# Patient Record
Sex: Male | Born: 1950
Health system: Southern US, Community
[De-identification: ages and names within clinical notes are randomized; demographics above are authoritative.]

## PROBLEM LIST (undated history)

## (undated) DIAGNOSIS — I10 Essential (primary) hypertension: Secondary | ICD-10-CM

## (undated) DIAGNOSIS — M1990 Unspecified osteoarthritis, unspecified site: Secondary | ICD-10-CM

## (undated) DIAGNOSIS — F419 Anxiety disorder, unspecified: Secondary | ICD-10-CM

## (undated) DIAGNOSIS — I82409 Acute embolism and thrombosis of unspecified deep veins of unspecified lower extremity: Secondary | ICD-10-CM

## (undated) DIAGNOSIS — Z923 Personal history of irradiation: Secondary | ICD-10-CM

## (undated) DIAGNOSIS — C801 Malignant (primary) neoplasm, unspecified: Secondary | ICD-10-CM

## (undated) DIAGNOSIS — K219 Gastro-esophageal reflux disease without esophagitis: Secondary | ICD-10-CM

## (undated) DIAGNOSIS — F32A Depression, unspecified: Secondary | ICD-10-CM

## (undated) HISTORY — PX: COLONOSCOPY: SHX174

## (undated) HISTORY — PX: TONSILLECTOMY: SUR1361

## (undated) HISTORY — PX: ADENOIDECTOMY: SUR15

---
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
## 2014-02-24 ENCOUNTER — Other Ambulatory Visit (HOSPITAL_COMMUNITY): Payer: Self-pay | Admitting: Urology

## 2014-02-24 DIAGNOSIS — C61 Malignant neoplasm of prostate: Secondary | ICD-10-CM

## 2014-03-09 ENCOUNTER — Encounter (HOSPITAL_COMMUNITY): Payer: Commercial Managed Care - HMO

## 2014-03-09 ENCOUNTER — Encounter (HOSPITAL_COMMUNITY): Payer: Self-pay

## 2014-03-29 ENCOUNTER — Ambulatory Visit (HOSPITAL_COMMUNITY)
Admission: RE | Admit: 2014-03-29 | Discharge: 2014-03-29 | Disposition: A | Discharge status: Home / Self Care | Payer: Medicare PPO | Source: Ambulatory Visit | Attending: Urology | Admitting: Urology

## 2014-03-29 ENCOUNTER — Encounter (HOSPITAL_COMMUNITY): Admission: RE | Admit: 2014-03-29 | Payer: Medicare PPO | Source: Ambulatory Visit

## 2014-03-29 DIAGNOSIS — C61 Malignant neoplasm of prostate: Secondary | ICD-10-CM | POA: Diagnosis present

## 2014-03-29 MED ORDER — TECHNETIUM TC 99M MEDRONATE IV KIT
25.0000 | PACK | Freq: Once | INTRAVENOUS | Status: AC | PRN
  Administered 2014-03-29: 25 via INTRAVENOUS

## 2014-06-08 ENCOUNTER — Encounter (HOSPITAL_COMMUNITY): Payer: Self-pay | Admitting: Emergency Medicine

## 2014-06-08 ENCOUNTER — Emergency Department (HOSPITAL_COMMUNITY): Payer: Medicare PPO

## 2014-06-08 ENCOUNTER — Emergency Department (HOSPITAL_COMMUNITY)
Admission: EM | Admit: 2014-06-08 | Discharge: 2014-06-08 | Disposition: A | Discharge status: Home / Self Care | Payer: Medicare PPO | Attending: Emergency Medicine | Admitting: Emergency Medicine

## 2014-06-08 DIAGNOSIS — I10 Essential (primary) hypertension: Secondary | ICD-10-CM | POA: Insufficient documentation

## 2014-06-08 DIAGNOSIS — I82401 Acute embolism and thrombosis of unspecified deep veins of right lower extremity: Secondary | ICD-10-CM

## 2014-06-08 DIAGNOSIS — M7989 Other specified soft tissue disorders: Secondary | ICD-10-CM

## 2014-06-08 DIAGNOSIS — Z79899 Other long term (current) drug therapy: Secondary | ICD-10-CM | POA: Insufficient documentation

## 2014-06-08 DIAGNOSIS — I82409 Acute embolism and thrombosis of unspecified deep veins of unspecified lower extremity: Secondary | ICD-10-CM | POA: Insufficient documentation

## 2014-06-08 DIAGNOSIS — Z859 Personal history of malignant neoplasm, unspecified: Secondary | ICD-10-CM | POA: Diagnosis not present

## 2014-06-08 DIAGNOSIS — M79609 Pain in unspecified limb: Secondary | ICD-10-CM | POA: Insufficient documentation

## 2014-06-08 HISTORY — DX: Malignant (primary) neoplasm, unspecified: C80.1

## 2014-06-08 HISTORY — DX: Essential (primary) hypertension: I10

## 2014-06-08 LAB — CBC WITH DIFFERENTIAL/PLATELET
BASOS ABS: 0.1 10*3/uL (ref 0.0–0.1)
Basophils Relative: 1 % (ref 0–1)
Eosinophils Absolute: 0.1 10*3/uL (ref 0.0–0.7)
Eosinophils Relative: 1 % (ref 0–5)
HEMATOCRIT: 44.6 % (ref 39.0–52.0)
Hemoglobin: 15.3 g/dL (ref 13.0–17.0)
LYMPHS PCT: 14 % (ref 12–46)
Lymphs Abs: 1.5 10*3/uL (ref 0.7–4.0)
MCH: 31.2 pg (ref 26.0–34.0)
MCHC: 34.3 g/dL (ref 30.0–36.0)
MCV: 90.8 fL (ref 78.0–100.0)
MONO ABS: 1.2 10*3/uL — AB (ref 0.1–1.0)
MONOS PCT: 10 % (ref 3–12)
Neutro Abs: 8.4 10*3/uL — ABNORMAL HIGH (ref 1.7–7.7)
Neutrophils Relative %: 74 % (ref 43–77)
Platelets: 171 10*3/uL (ref 150–400)
RBC: 4.91 MIL/uL (ref 4.22–5.81)
RDW: 13.2 % (ref 11.5–15.5)
WBC: 11.2 10*3/uL — ABNORMAL HIGH (ref 4.0–10.5)

## 2014-06-08 LAB — BASIC METABOLIC PANEL WITH GFR
Anion gap: 11 (ref 5–15)
BUN: 16 mg/dL (ref 6–23)
CO2: 28 meq/L (ref 19–32)
Calcium: 9.5 mg/dL (ref 8.4–10.5)
Chloride: 100 meq/L (ref 96–112)
Creatinine, Ser: 1.25 mg/dL (ref 0.50–1.35)
GFR calc Af Amer: 70 mL/min — ABNORMAL LOW
GFR calc non Af Amer: 61 mL/min — ABNORMAL LOW
Glucose, Bld: 96 mg/dL (ref 70–99)
Potassium: 4.4 meq/L (ref 3.7–5.3)
Sodium: 139 meq/L (ref 137–147)

## 2014-06-08 LAB — PROTIME-INR
INR: 1.07 (ref 0.00–1.49)
Prothrombin Time: 13.9 s (ref 11.6–15.2)

## 2014-06-08 LAB — APTT: aPTT: 30 s (ref 24–37)

## 2014-06-08 MED ORDER — RIVAROXABAN 15 MG PO TABS: 15.0000 mg | ORAL_TABLET | Freq: Two times a day (BID) | ORAL | Status: DC

## 2014-06-08 MED ORDER — RIVAROXABAN 15 MG PO TABS
15.0000 mg | ORAL_TABLET | Freq: Once | ORAL | Status: AC
  Administered 2014-06-08: 15 mg via ORAL
  Filled 2014-06-08: qty 1

## 2014-06-08 MED ORDER — OXYCODONE-ACETAMINOPHEN 5-325 MG PO TABS
1.0000 | ORAL_TABLET | Freq: Once | ORAL | Status: AC
  Administered 2014-06-08: 1 via ORAL
  Filled 2014-06-08: qty 1

## 2014-06-08 NOTE — ED Notes (Signed)
Pt c/o right leg pain with swelling x 1 week getting more severe; pt sts some swelling and denies obvious injury

## 2014-06-08 NOTE — ED Notes (Signed)
Pt. Ate 75% of dinner,.

## 2014-06-08 NOTE — ED Provider Notes (Signed)
CSN: [id number]     Arrival date & time 06/08/14  1246 History   First MD Initiated Contact with Patient 06/08/14 1452     Chief Complaint  Patient presents with  . Leg Pain     (Consider location/radiation/quality/duration/timing/severity/associated sxs/prior Treatment) HPI  Right leg pain and swelling for one week, without significant trauma. He has chronic right knee pain from arthritis. He has previously had a Baker's cyst drained from the right knee. He denies fever, chills, nausea, vomiting, cough, shortness of breath, or chest pain. He is taking his usual medications, without relief.   Past Medical History  Diagnosis Date  . Hypertension   . Cancer    History reviewed. No pertinent past surgical history. History reviewed. No pertinent family history. History  Substance Use Topics  . Smoking status: Never Smoker   . Smokeless tobacco: Not on file  . Alcohol Use: No    Review of Systems  All other systems reviewed and are negative.     Allergies  Atacand; Amlodipine; Monopril; Naproxen; and Niaspan  Home Medications   Prior to Admission medications   Medication Sig Start Date End Date Taking? Authorizing Provider  buPROPion (WELLBUTRIN SR) 150 MG 12 hr tablet Take 150 mg by mouth 2 (two) times daily. 04/25/14  Yes Historical Provider, MD  Calcium Carbonate-Vitamin D (CALCIUM + D PO) Take 1 tablet by mouth daily.   Yes Historical Provider, MD  clonazePAM (KLONOPIN) 1 MG tablet Take 1 mg by mouth 2 (two) times daily.  05/26/14  Yes Historical Provider, MD  Flaxseed, Linseed, (FLAX SEED OIL PO) Take 1 tablet by mouth daily.    Yes Historical Provider, MD  lamoTRIgine (LAMICTAL) 100 MG tablet Take 100 mg by mouth 2 (two) times daily. 03/06/14  Yes Historical Provider, MD  leuprolide (LUPRON) 22.5 MG injection Inject 22.5 mg into the muscle every 3 (three) months.   Yes Historical Provider, MD  lovastatin (MEVACOR) 40 MG tablet Take 40 mg by mouth at bedtime.  05/05/14  Yes  Historical Provider, MD  metoprolol succinate (TOPROL-XL) 25 MG 24 hr tablet Take 25 mg by mouth daily. 03/06/14  Yes Historical Provider, MD  Omega-3 Fatty Acids (OMEGA 3 PO) Take 1 tablet by mouth daily.    Yes Historical Provider, MD  pantoprazole (PROTONIX) 40 MG tablet Take 40 mg by mouth daily.   Yes Historical Provider, MD  Rivaroxaban (XARELTO) 15 MG TABS tablet Take 1 tablet (15 mg total) by mouth 2 (two) times daily. 06/08/14   Richarda Blade, MD   BP 136/78  Pulse 75  Temp(Src) 97.9 F (36.6 C) (Oral)  Resp 20  SpO2 99% Physical Exam  Nursing note and vitals reviewed. Constitutional: He is oriented to person, place, and time. He appears well-developed and well-nourished.  HENT:  Head: Normocephalic and atraumatic.  Right Ear: External ear normal.  Left Ear: External ear normal.  Eyes: Conjunctivae and EOM are normal. Pupils are equal, round, and reactive to light.  Neck: Normal range of motion and phonation normal. Neck supple.  Cardiovascular: Normal rate, regular rhythm, normal heart sounds and intact distal pulses.   Pulmonary/Chest: Effort normal and breath sounds normal. He exhibits no bony tenderness.  Abdominal: Soft. There is no tenderness.  Musculoskeletal: Normal range of motion. He exhibits edema (right leg, thigh to ankle, 2+. Diffuse tenderness. Right leg, both upper and lower leg, anterior knee, and posterior knee).  Neurological: He is alert and oriented to person, place, and time. No cranial  nerve deficit or sensory deficit. He exhibits normal muscle tone. Coordination normal.  Skin: Skin is warm, dry and intact.  Psychiatric: He has a normal mood and affect. His behavior is normal. Judgment and thought content normal.    ED Course  Procedures (including critical care time)  Medications  oxyCODONE-acetaminophen (PERCOCET/ROXICET) 5-325 MG per tablet 1 tablet (1 tablet Oral Given 06/08/14 1612)  Rivaroxaban (XARELTO) tablet 15 mg (15 mg Oral Given 06/08/14 1813)     Patient Vitals for the past 24 hrs:  BP Temp Temp src Pulse Resp SpO2  06/08/14 1815 136/78 mmHg - - 75 - 99 %  06/08/14 1645 133/80 mmHg - - 76 - 99 %  06/08/14 1630 147/85 mmHg - - 81 - 92 %  06/08/14 1615 145/81 mmHg - - 84 - 100 %  06/08/14 1600 151/92 mmHg - - 98 20 98 %  06/08/14 1500 160/88 mmHg - - 84 - 100 %  06/08/14 1430 143/78 mmHg - - 83 - 99 %  06/08/14 1427 125/60 mmHg - - 107 20 100 %  06/08/14 1302 185/102 mmHg 97.9 F (36.6 C) Oral 102 18 99 %    4:48 PM Reevaluation with update and discussion. After initial assessment and treatment, an updated evaluation reveals no change in clinical status. Marcele Kosta L   17:10- Discussed with Dr. Marcelyn Ditty, on-call for Dr. Kathyrn Lass. She is comfortable with starting the pt on Xarelto, and follow up in the office in 3 days. Findings discussed with pt. And family. All questions answered.  Labs Review Labs Reviewed  CBC WITH DIFFERENTIAL - Abnormal; Notable for the following:    WBC 11.2 (*)    Neutro Abs 8.4 (*)    Monocytes Absolute 1.2 (*)    All other components within normal limits  BASIC METABOLIC PANEL - Abnormal; Notable for the following:    GFR calc non Af Amer 61 (*)    GFR calc Af Amer 70 (*)    All other components within normal limits  PROTIME-INR  APTT   Vascular Ultrasound  Bilateral lower extremity venous duplex has been completed. Preliminary findings: Right: DVT noted from the CFV through the FV, Pop vein, PTV. Unable to visualize Pero V . Nonocclusive in the proximal CFV but occlusive throughout distally. No evidence of superficial thrombosis. No Baker's cyst. Left: No evidence of DVT, superficial thrombosis, or Baker's cyst.  LITTLE, CHRISTY FRANCES  06/08/2014, 4:16 PM   Imaging Review Dg Knee Complete 4 Views Right  06/08/2014   CLINICAL DATA:  Right knee pain  EXAM: RIGHT KNEE - COMPLETE 4+ VIEW  COMPARISON:  None.  FINDINGS: There is tricompartmental degenerative joint disease of the right knee.  The major loss of joint space is at the patella femoral articulation with spurring present as well. No fracture is seen. No significant joint effusion is noted. There do appear to be loose bodies however within the joint space.  IMPRESSION: Tricompartmental degenerative joint disease. Loose bodies in the joint space.   Electronically Signed   By: Ivar Drape M.D.   On: 06/08/2014 17:19     EKG Interpretation None      MDM   Final diagnoses:  DVT (deep venous thrombosis), right    Extensive right leg DVT; not occlusive, in the common femoral vein. He has not had a symptoms of chest pain or shortness of breath, so I doubt that he has a PE. Is stable for discharge with outpatient management and close followup.  Nursing  Notes Reviewed/ Care Coordinated Applicable Imaging Reviewed Interpretation of Laboratory Data incorporated into ED treatment  The patient appears reasonably screened and/or stabilized for discharge and I doubt any other medical condition or other [facility] requiring further screening, evaluation, or treatment in the ED at this time prior to discharge.  Plan: Home Medications- Xarelto; Home Treatments- rest, elevate right leg as much as possible, routine in-home ambulation; return here if the recommended treatment, does not improve the symptoms; Recommended follow up- PCP f/u 3 days   Richarda Blade, MD 06/08/14 2013

## 2014-06-08 NOTE — ED Notes (Signed)
Dinner tray ordered.

## 2014-06-08 NOTE — Progress Notes (Signed)
*  PRELIMINARY RESULTS* Vascular Ultrasound Bilateral lower extremity venous duplex has been completed.  Preliminary findings: Right:  DVT noted from the CFV through the FV, Pop vein, PTV. Unable to visualize Pero V . Nonocclusive in the proximal CFV but occlusive throughout distally.  No evidence of superficial thrombosis.  No Baker's cyst. Left:  No evidence of DVT, superficial thrombosis, or Baker's cyst.   Dee Maday FRANCES 06/08/2014, 4:16 PM

## 2014-06-08 NOTE — ED Notes (Signed)
Dr. Eulis Foster into update pt. On plan of care

## 2014-06-08 NOTE — Discharge Instructions (Signed)
Elevate your right leg above the heart as much as possible. Start the Xarelto prescription in the morning.  Deep Vein Thrombosis A deep vein thrombosis (DVT) is a blood clot that develops in the deep, larger veins of the leg, arm, or pelvis. These are more dangerous than clots that might form in veins near the surface of the body. A DVT can lead to serious and even life-threatening complications if the clot breaks off and travels in the bloodstream to the lungs.  A DVT can damage the valves in your leg veins so that instead of flowing upward, the blood pools in the lower leg. This is called post-thrombotic syndrome, and it can result in pain, swelling, discoloration, and sores on the leg. CAUSES Usually, several things contribute to the formation of blood clots. Contributing factors include:  The flow of blood slows down.  The inside of the vein is damaged in some way.  You have a condition that makes blood clot more easily. RISK FACTORS Some people are more likely than others to develop blood clots. Risk factors include:   Smoking.  Being overweight (obese).  Sitting or lying still for a long time. This includes long-distance travel, paralysis, or recovery from an illness or surgery. Other factors that increase risk are:   Older age, especially over 5 years of age.  Having a family history of blood clots or if you have already had a blot clot.  Having major or lengthy surgery. This is especially true for surgery on the hip, knee, or belly (abdomen). Hip surgery is particularly high risk.  Having a long, thin tube (catheter) placed inside a vein during a medical procedure.  Breaking a hip or leg.  Having cancer or cancer treatment.  Pregnancy and childbirth.  Hormone changes make the blood clot more easily during pregnancy.  The fetus puts pressure on the veins of the pelvis.  There is a risk of injury to veins during delivery or a caesarean delivery. The risk is highest  just after childbirth.  Medicines containing the male hormone estrogen. This includes birth control pills and hormone replacement therapy.  Other circulation or heart problems.  SIGNS AND SYMPTOMS When a clot forms, it can either partially or totally block the blood flow in that vein. Symptoms of a DVT can include:  Swelling of the leg or arm, especially if one side is much worse.  Warmth and redness of the leg or arm, especially if one side is much worse.  Pain in an arm or leg. If the clot is in the leg, symptoms may be more noticeable or worse when standing or walking. The symptoms of a DVT that has traveled to the lungs (pulmonary embolism, PE) usually start suddenly and include:  Shortness of breath.  Coughing.  Coughing up blood or blood-tinged mucus.  Chest pain. The chest pain is often worse with deep breaths.  Rapid heartbeat. Anyone with these symptoms should get emergency medical treatment right away. Do not wait to see if the symptoms will go away. Call your local emergency services (911 in the [place] if you have these symptoms. Do not drive yourself to the hospital. DIAGNOSIS If a DVT is suspected, your health care provider will take a full medical history and perform a physical exam. Tests that also may be required include:  Blood tests, including studies of the clotting properties of the blood.  Ultrasound to see if you have clots in your legs or lungs.  X-rays to show the flow  of blood when dye is injected into the veins (venogram).  Studies of your lungs if you have any chest symptoms. PREVENTION  Exercise the legs regularly. Take a brisk 30-minute walk every day.  Maintain a weight that is appropriate for your height.  Avoid sitting or lying in bed for long periods of time without moving your legs.  Women, particularly those over the age of 57 years, should consider the risks and benefits of taking estrogen medicines, including birth control pills.  Do  not smoke, especially if you take estrogen medicines.  Long-distance travel can increase your risk of DVT. You should exercise your legs by walking or pumping the muscles every hour.  Many of the risk factors above relate to situations that exist with hospitalization, either for illness, injury, or elective surgery. Prevention may include medical and nonmedical measures.  Your health care provider will assess you for the need for venous thromboembolism prevention when you are admitted to the hospital. If you are having surgery, your surgeon will assess you the day of or day after surgery. TREATMENT Once identified, a DVT can be treated. It can also be prevented in some circumstances. Once you have had a DVT, you may be at increased risk for a DVT in the future. The most common treatment for DVT is blood-thinning (anticoagulant) medicine, which reduces the blood's tendency to clot. Anticoagulants can stop new blood clots from forming and stop old clots from growing. They cannot dissolve existing clots. Your body does this by itself over time. Anticoagulants can be given by mouth, through an IV tube, or by injection. Your health care provider will determine the best program for you. Other medicines or treatments that may be used are:  Heparin or related medicines (low molecular weight heparin) are often the first treatment for a blood clot. They act quickly. However, they cannot be taken orally and must be given either in shot form or by IV tube.  Heparin can cause a fall in a component of blood that stops bleeding and forms blood clots (platelets). You will be monitored with blood tests to be sure this does not occur.  Warfarin is an anticoagulant that can be swallowed. It takes a few days to start working, so usually heparin or related medicines are used in combination. Once warfarin is working, heparin is usually stopped.  Factor Xa inhibitor medicines, such as rivaroxaban and apixaban, also reduce  blood clotting. These medicines are taken orally and can often be used without heparin or related medicines.  Less commonly, clot dissolving drugs (thrombolytics) are used to dissolve a DVT. They carry a high risk of bleeding, so they are used mainly in severe cases where your life or a part of your body is threatened.  Very rarely, a blood clot in the leg needs to be removed surgically.  If you are unable to take anticoagulants, your health care provider may arrange for you to have a filter placed in a main vein in your abdomen. This filter prevents clots from traveling to your lungs. HOME CARE INSTRUCTIONS  Take all medicines as directed by your health care provider.  Learn as much as you can about DVT.  Wear a medical alert bracelet or carry a medical alert card.  Ask your health care provider how soon you can go back to normal activities. It is important to stay active to prevent blood clots. If you are on anticoagulant medicine, avoid contact sports.  It is very important to exercise. This is  especially important while traveling, sitting, or standing for long periods of time. Exercise your legs by walking or by tightening and relaxing your leg muscles regularly. Take frequent walks.  You may need to wear compression stockings. These are tight elastic stockings that apply pressure to the lower legs. This pressure can help keep the blood in the legs from clotting. Taking Warfarin Warfarin is a daily medicine that is taken by mouth. Your health care provider will advise you on the length of treatment (usually 3-6 months, sometimes lifelong). If you take warfarin:  Understand how to take warfarin and foods that can affect how warfarin works in Veterinary surgeon.  Too much and too little warfarin are both dangerous. Too much warfarin increases the risk of bleeding. Too little warfarin continues to allow the risk for blood clots. Warfarin and Regular Blood Testing While taking warfarin, you will  need to have regular blood tests to measure your blood clotting time. These blood tests usually include both the prothrombin time (PT) and international normalized ratio (INR) tests. The PT and INR results allow your health care provider to adjust your dose of warfarin. It is very important that you have your PT and INR tested as often as directed by your health care provider.  Warfarin and Your Diet Avoid major changes in your diet, or notify your health care provider before changing your diet. Arrange a visit with a registered dietitian to answer your questions. Many foods, especially foods high in vitamin K, can interfere with warfarin and affect the PT and INR results. You should eat a consistent amount of foods high in vitamin K. Foods high in vitamin K include:   Spinach, kale, broccoli, cabbage, collard and turnip greens, Brussels sprouts, peas, cauliflower, seaweed, and parsley.  Beef and pork liver.  Green tea.  Soybean oil. Warfarin with Other Medicines Many medicines can interfere with warfarin and affect the PT and INR results. You must:  Tell your health care provider about any and all medicines, vitamins, and supplements you take, including aspirin and other over-the-counter anti-inflammatory medicines. Be especially cautious with aspirin and anti-inflammatory medicines. Ask your health care provider before taking these.  Do not take or discontinue any prescribed or over-the-counter medicine except on the advice of your health care provider or pharmacist. Warfarin Side Effects Warfarin can have side effects, such as easy bruising and difficulty stopping bleeding. Ask your health care provider or pharmacist about other side effects of warfarin. You will need to:  Hold pressure over cuts for longer than usual.  Notify your dentist and other health care providers that you are taking warfarin before you undergo any procedures where bleeding may occur. Warfarin with Alcohol and  Tobacco   Drinking alcohol frequently can increase the effect of warfarin, leading to excess bleeding. It is best to avoid alcoholic drinks or to consume only very small amounts while taking warfarin. Notify your health care provider if you change your alcohol intake.   Do not use any tobacco products including cigarettes, chewing tobacco, or electronic cigarettes. If you smoke, quit. Ask your health care provider for help with quitting smoking. Alternative Medicines to Warfarin: Factor Xa Inhibitor Medicines  These blood-thinning medicines are taken by mouth, usually for several weeks or longer. It is important to take the medicine every single day at the same time each day.  There are no regular blood tests required when using these medicines.  There are fewer food and drug interactions than with warfarin.  The side  effects of this class of medicine are similar to those of warfarin, including excessive bruising or bleeding. Ask your health care provider or pharmacist about other potential side effects. SEEK MEDICAL CARE IF:  You notice a rapid heartbeat.  You feel weaker or more tired than usual.  You feel faint.  You notice increased bruising.  You feel your symptoms are not getting better in the time expected.  You believe you are having side effects of medicine. SEEK IMMEDIATE MEDICAL CARE IF:  You have chest pain.  You have trouble breathing.  You have new or increased swelling or pain in one leg.  You cough up blood.  You notice blood in vomit, in a bowel movement, or in urine. MAKE SURE YOU:  Understand these instructions.  Will watch your condition.  Will get help right away if you are not doing well or get worse. Document Released: 09/22/2005 Document Revised: 02/06/2014 Document Reviewed: 05/30/2013 [facility] Patient Information 2015 [place], [place]. This information is not intended to replace advice given to you by your health care provider. Make sure you  discuss any questions you have with your health care provider.

## 2015-01-22 ENCOUNTER — Other Ambulatory Visit: Payer: Self-pay | Admitting: Family Medicine

## 2015-01-22 DIAGNOSIS — I82401 Acute embolism and thrombosis of unspecified deep veins of right lower extremity: Secondary | ICD-10-CM

## 2015-01-25 ENCOUNTER — Ambulatory Visit
Admission: RE | Admit: 2015-01-25 | Discharge: 2015-01-25 | Disposition: A | Discharge status: Home / Self Care | Payer: Medicare Other | Source: Ambulatory Visit | Attending: Family Medicine | Admitting: Family Medicine

## 2015-01-25 DIAGNOSIS — I82401 Acute embolism and thrombosis of unspecified deep veins of right lower extremity: Secondary | ICD-10-CM

## 2015-02-28 DIAGNOSIS — I82409 Acute embolism and thrombosis of unspecified deep veins of unspecified lower extremity: Secondary | ICD-10-CM | POA: Insufficient documentation

## 2015-10-31 DIAGNOSIS — H6122 Impacted cerumen, left ear: Secondary | ICD-10-CM | POA: Diagnosis not present

## 2015-11-29 DIAGNOSIS — C61 Malignant neoplasm of prostate: Secondary | ICD-10-CM | POA: Diagnosis not present

## 2015-12-10 DIAGNOSIS — E6609 Other obesity due to excess calories: Secondary | ICD-10-CM | POA: Diagnosis not present

## 2015-12-10 DIAGNOSIS — C61 Malignant neoplasm of prostate: Secondary | ICD-10-CM | POA: Diagnosis not present

## 2015-12-10 DIAGNOSIS — Z6841 Body Mass Index (BMI) 40.0 and over, adult: Secondary | ICD-10-CM | POA: Diagnosis not present

## 2015-12-10 DIAGNOSIS — I10 Essential (primary) hypertension: Secondary | ICD-10-CM | POA: Diagnosis not present

## 2015-12-10 DIAGNOSIS — F319 Bipolar disorder, unspecified: Secondary | ICD-10-CM | POA: Diagnosis not present

## 2015-12-10 DIAGNOSIS — Z86718 Personal history of other venous thrombosis and embolism: Secondary | ICD-10-CM | POA: Diagnosis not present

## 2015-12-10 DIAGNOSIS — E782 Mixed hyperlipidemia: Secondary | ICD-10-CM | POA: Diagnosis not present

## 2015-12-10 DIAGNOSIS — Z1389 Encounter for screening for other disorder: Secondary | ICD-10-CM | POA: Diagnosis not present

## 2015-12-10 DIAGNOSIS — F411 Generalized anxiety disorder: Secondary | ICD-10-CM | POA: Diagnosis not present

## 2015-12-19 DIAGNOSIS — C4361 Malignant melanoma of right upper limb, including shoulder: Secondary | ICD-10-CM | POA: Diagnosis not present

## 2015-12-19 DIAGNOSIS — L821 Other seborrheic keratosis: Secondary | ICD-10-CM | POA: Diagnosis not present

## 2015-12-19 DIAGNOSIS — D225 Melanocytic nevi of trunk: Secondary | ICD-10-CM | POA: Diagnosis not present

## 2016-01-09 DIAGNOSIS — C4361 Malignant melanoma of right upper limb, including shoulder: Secondary | ICD-10-CM | POA: Diagnosis not present

## 2016-01-09 DIAGNOSIS — L905 Scar conditions and fibrosis of skin: Secondary | ICD-10-CM | POA: Diagnosis not present

## 2016-02-11 DIAGNOSIS — C61 Malignant neoplasm of prostate: Secondary | ICD-10-CM | POA: Diagnosis not present

## 2016-02-15 DIAGNOSIS — H00025 Hordeolum internum left lower eyelid: Secondary | ICD-10-CM | POA: Diagnosis not present

## 2016-02-21 DIAGNOSIS — C61 Malignant neoplasm of prostate: Secondary | ICD-10-CM | POA: Diagnosis not present

## 2016-04-23 DIAGNOSIS — Z08 Encounter for follow-up examination after completed treatment for malignant neoplasm: Secondary | ICD-10-CM | POA: Diagnosis not present

## 2016-04-23 DIAGNOSIS — Z1283 Encounter for screening for malignant neoplasm of skin: Secondary | ICD-10-CM | POA: Diagnosis not present

## 2016-04-23 DIAGNOSIS — Z85820 Personal history of malignant melanoma of skin: Secondary | ICD-10-CM | POA: Diagnosis not present

## 2016-06-11 DIAGNOSIS — F319 Bipolar disorder, unspecified: Secondary | ICD-10-CM | POA: Diagnosis not present

## 2016-06-11 DIAGNOSIS — I10 Essential (primary) hypertension: Secondary | ICD-10-CM | POA: Diagnosis not present

## 2016-06-11 DIAGNOSIS — F411 Generalized anxiety disorder: Secondary | ICD-10-CM | POA: Diagnosis not present

## 2016-06-11 DIAGNOSIS — Z86718 Personal history of other venous thrombosis and embolism: Secondary | ICD-10-CM | POA: Diagnosis not present

## 2016-06-11 DIAGNOSIS — C61 Malignant neoplasm of prostate: Secondary | ICD-10-CM | POA: Diagnosis not present

## 2016-06-11 DIAGNOSIS — E782 Mixed hyperlipidemia: Secondary | ICD-10-CM | POA: Diagnosis not present

## 2016-06-11 DIAGNOSIS — Z23 Encounter for immunization: Secondary | ICD-10-CM | POA: Diagnosis not present

## 2016-07-28 DIAGNOSIS — L820 Inflamed seborrheic keratosis: Secondary | ICD-10-CM | POA: Diagnosis not present

## 2016-07-28 DIAGNOSIS — Z1283 Encounter for screening for malignant neoplasm of skin: Secondary | ICD-10-CM | POA: Diagnosis not present

## 2016-07-28 DIAGNOSIS — L821 Other seborrheic keratosis: Secondary | ICD-10-CM | POA: Diagnosis not present

## 2016-07-28 DIAGNOSIS — Z08 Encounter for follow-up examination after completed treatment for malignant neoplasm: Secondary | ICD-10-CM | POA: Diagnosis not present

## 2016-07-28 DIAGNOSIS — Z85820 Personal history of malignant melanoma of skin: Secondary | ICD-10-CM | POA: Diagnosis not present

## 2016-08-26 DIAGNOSIS — Z8546 Personal history of malignant neoplasm of prostate: Secondary | ICD-10-CM | POA: Diagnosis not present

## 2016-10-02 DIAGNOSIS — F4001 Agoraphobia with panic disorder: Secondary | ICD-10-CM | POA: Diagnosis not present

## 2016-10-02 DIAGNOSIS — F319 Bipolar disorder, unspecified: Secondary | ICD-10-CM | POA: Diagnosis not present

## 2016-10-02 DIAGNOSIS — F411 Generalized anxiety disorder: Secondary | ICD-10-CM | POA: Diagnosis not present

## 2016-10-02 DIAGNOSIS — R197 Diarrhea, unspecified: Secondary | ICD-10-CM | POA: Diagnosis not present

## 2016-10-29 DIAGNOSIS — Z08 Encounter for follow-up examination after completed treatment for malignant neoplasm: Secondary | ICD-10-CM | POA: Diagnosis not present

## 2016-10-29 DIAGNOSIS — Z85820 Personal history of malignant melanoma of skin: Secondary | ICD-10-CM | POA: Diagnosis not present

## 2016-10-29 DIAGNOSIS — Z1283 Encounter for screening for malignant neoplasm of skin: Secondary | ICD-10-CM | POA: Diagnosis not present

## 2016-10-29 DIAGNOSIS — L821 Other seborrheic keratosis: Secondary | ICD-10-CM | POA: Diagnosis not present

## 2016-11-25 DIAGNOSIS — Z8546 Personal history of malignant neoplasm of prostate: Secondary | ICD-10-CM | POA: Diagnosis not present

## 2017-01-07 DIAGNOSIS — H6122 Impacted cerumen, left ear: Secondary | ICD-10-CM | POA: Diagnosis not present

## 2017-01-07 DIAGNOSIS — H6521 Chronic serous otitis media, right ear: Secondary | ICD-10-CM | POA: Diagnosis not present

## 2017-01-07 DIAGNOSIS — H6061 Unspecified chronic otitis externa, right ear: Secondary | ICD-10-CM | POA: Diagnosis not present

## 2017-01-07 DIAGNOSIS — H6501 Acute serous otitis media, right ear: Secondary | ICD-10-CM | POA: Diagnosis not present

## 2017-01-14 DIAGNOSIS — H6061 Unspecified chronic otitis externa, right ear: Secondary | ICD-10-CM | POA: Diagnosis not present

## 2017-01-14 DIAGNOSIS — H6121 Impacted cerumen, right ear: Secondary | ICD-10-CM | POA: Diagnosis not present

## 2017-01-14 DIAGNOSIS — H6691 Otitis media, unspecified, right ear: Secondary | ICD-10-CM | POA: Diagnosis not present

## 2017-01-28 DIAGNOSIS — Z1283 Encounter for screening for malignant neoplasm of skin: Secondary | ICD-10-CM | POA: Diagnosis not present

## 2017-01-28 DIAGNOSIS — Z85820 Personal history of malignant melanoma of skin: Secondary | ICD-10-CM | POA: Diagnosis not present

## 2017-01-28 DIAGNOSIS — Z08 Encounter for follow-up examination after completed treatment for malignant neoplasm: Secondary | ICD-10-CM | POA: Diagnosis not present

## 2017-02-02 DIAGNOSIS — N183 Chronic kidney disease, stage 3 (moderate): Secondary | ICD-10-CM | POA: Diagnosis not present

## 2017-02-02 DIAGNOSIS — F319 Bipolar disorder, unspecified: Secondary | ICD-10-CM | POA: Diagnosis not present

## 2017-02-02 DIAGNOSIS — F411 Generalized anxiety disorder: Secondary | ICD-10-CM | POA: Diagnosis not present

## 2017-02-02 DIAGNOSIS — C61 Malignant neoplasm of prostate: Secondary | ICD-10-CM | POA: Diagnosis not present

## 2017-02-02 DIAGNOSIS — I10 Essential (primary) hypertension: Secondary | ICD-10-CM | POA: Diagnosis not present

## 2017-02-02 DIAGNOSIS — Z86718 Personal history of other venous thrombosis and embolism: Secondary | ICD-10-CM | POA: Diagnosis not present

## 2017-02-02 DIAGNOSIS — E782 Mixed hyperlipidemia: Secondary | ICD-10-CM | POA: Diagnosis not present

## 2017-02-02 DIAGNOSIS — Z6841 Body Mass Index (BMI) 40.0 and over, adult: Secondary | ICD-10-CM | POA: Diagnosis not present

## 2017-02-24 DIAGNOSIS — Z8546 Personal history of malignant neoplasm of prostate: Secondary | ICD-10-CM | POA: Diagnosis not present

## 2017-02-25 ENCOUNTER — Other Ambulatory Visit (HOSPITAL_COMMUNITY): Payer: Self-pay | Admitting: Family Medicine

## 2017-02-25 DIAGNOSIS — R52 Pain, unspecified: Secondary | ICD-10-CM

## 2017-02-26 ENCOUNTER — Other Ambulatory Visit (HOSPITAL_COMMUNITY): Payer: Self-pay | Admitting: Family Medicine

## 2017-02-26 DIAGNOSIS — M7989 Other specified soft tissue disorders: Principal | ICD-10-CM

## 2017-02-26 DIAGNOSIS — M79661 Pain in right lower leg: Secondary | ICD-10-CM

## 2017-02-27 ENCOUNTER — Ambulatory Visit (HOSPITAL_COMMUNITY): Payer: PPO

## 2017-03-03 DIAGNOSIS — C61 Malignant neoplasm of prostate: Secondary | ICD-10-CM | POA: Diagnosis not present

## 2017-03-03 DIAGNOSIS — C7802 Secondary malignant neoplasm of left lung: Secondary | ICD-10-CM | POA: Diagnosis not present

## 2017-04-29 DIAGNOSIS — Z85820 Personal history of malignant melanoma of skin: Secondary | ICD-10-CM | POA: Diagnosis not present

## 2017-04-29 DIAGNOSIS — Z08 Encounter for follow-up examination after completed treatment for malignant neoplasm: Secondary | ICD-10-CM | POA: Diagnosis not present

## 2017-04-29 DIAGNOSIS — Z1283 Encounter for screening for malignant neoplasm of skin: Secondary | ICD-10-CM | POA: Diagnosis not present

## 2017-06-02 DIAGNOSIS — Z8546 Personal history of malignant neoplasm of prostate: Secondary | ICD-10-CM | POA: Diagnosis not present

## 2017-07-22 DIAGNOSIS — H6121 Impacted cerumen, right ear: Secondary | ICD-10-CM | POA: Diagnosis not present

## 2017-07-29 DIAGNOSIS — Z08 Encounter for follow-up examination after completed treatment for malignant neoplasm: Secondary | ICD-10-CM | POA: Diagnosis not present

## 2017-07-29 DIAGNOSIS — Z85820 Personal history of malignant melanoma of skin: Secondary | ICD-10-CM | POA: Diagnosis not present

## 2017-07-29 DIAGNOSIS — C44311 Basal cell carcinoma of skin of nose: Secondary | ICD-10-CM | POA: Diagnosis not present

## 2017-07-29 DIAGNOSIS — D1801 Hemangioma of skin and subcutaneous tissue: Secondary | ICD-10-CM | POA: Diagnosis not present

## 2017-07-29 DIAGNOSIS — Z1283 Encounter for screening for malignant neoplasm of skin: Secondary | ICD-10-CM | POA: Diagnosis not present

## 2017-07-29 DIAGNOSIS — D225 Melanocytic nevi of trunk: Secondary | ICD-10-CM | POA: Diagnosis not present

## 2017-07-29 DIAGNOSIS — L821 Other seborrheic keratosis: Secondary | ICD-10-CM | POA: Diagnosis not present

## 2017-08-05 DIAGNOSIS — E782 Mixed hyperlipidemia: Secondary | ICD-10-CM | POA: Diagnosis not present

## 2017-08-05 DIAGNOSIS — Z6836 Body mass index (BMI) 36.0-36.9, adult: Secondary | ICD-10-CM | POA: Diagnosis not present

## 2017-08-05 DIAGNOSIS — C61 Malignant neoplasm of prostate: Secondary | ICD-10-CM | POA: Diagnosis not present

## 2017-08-05 DIAGNOSIS — Z86718 Personal history of other venous thrombosis and embolism: Secondary | ICD-10-CM | POA: Diagnosis not present

## 2017-08-05 DIAGNOSIS — F4001 Agoraphobia with panic disorder: Secondary | ICD-10-CM | POA: Diagnosis not present

## 2017-08-05 DIAGNOSIS — F319 Bipolar disorder, unspecified: Secondary | ICD-10-CM | POA: Diagnosis not present

## 2017-08-05 DIAGNOSIS — N183 Chronic kidney disease, stage 3 (moderate): Secondary | ICD-10-CM | POA: Diagnosis not present

## 2017-08-05 DIAGNOSIS — F411 Generalized anxiety disorder: Secondary | ICD-10-CM | POA: Diagnosis not present

## 2017-08-05 DIAGNOSIS — I10 Essential (primary) hypertension: Secondary | ICD-10-CM | POA: Diagnosis not present

## 2017-08-10 DIAGNOSIS — Z08 Encounter for follow-up examination after completed treatment for malignant neoplasm: Secondary | ICD-10-CM | POA: Diagnosis not present

## 2017-08-10 DIAGNOSIS — Z85828 Personal history of other malignant neoplasm of skin: Secondary | ICD-10-CM | POA: Diagnosis not present

## 2017-09-01 DIAGNOSIS — Z8546 Personal history of malignant neoplasm of prostate: Secondary | ICD-10-CM | POA: Diagnosis not present

## 2017-09-07 DIAGNOSIS — C7801 Secondary malignant neoplasm of right lung: Secondary | ICD-10-CM | POA: Diagnosis not present

## 2017-09-07 DIAGNOSIS — C61 Malignant neoplasm of prostate: Secondary | ICD-10-CM | POA: Diagnosis not present

## 2017-09-07 DIAGNOSIS — R9721 Rising PSA following treatment for malignant neoplasm of prostate: Secondary | ICD-10-CM | POA: Diagnosis not present

## 2017-09-07 DIAGNOSIS — Z23 Encounter for immunization: Secondary | ICD-10-CM | POA: Diagnosis not present

## 2017-10-02 ENCOUNTER — Other Ambulatory Visit: Payer: Self-pay | Admitting: Urology

## 2017-10-02 DIAGNOSIS — C61 Malignant neoplasm of prostate: Secondary | ICD-10-CM

## 2017-11-09 DIAGNOSIS — Z85828 Personal history of other malignant neoplasm of skin: Secondary | ICD-10-CM | POA: Diagnosis not present

## 2017-11-09 DIAGNOSIS — Z1283 Encounter for screening for malignant neoplasm of skin: Secondary | ICD-10-CM | POA: Diagnosis not present

## 2017-11-09 DIAGNOSIS — Z08 Encounter for follow-up examination after completed treatment for malignant neoplasm: Secondary | ICD-10-CM | POA: Diagnosis not present

## 2017-11-09 DIAGNOSIS — Z85820 Personal history of malignant melanoma of skin: Secondary | ICD-10-CM | POA: Diagnosis not present

## 2017-11-30 ENCOUNTER — Encounter (HOSPITAL_COMMUNITY)
Admission: RE | Admit: 2017-11-30 | Discharge: 2017-11-30 | Disposition: A | Discharge status: Home / Self Care | Payer: PPO | Source: Ambulatory Visit | Attending: Urology | Admitting: Urology

## 2017-11-30 DIAGNOSIS — C61 Malignant neoplasm of prostate: Secondary | ICD-10-CM | POA: Diagnosis not present

## 2017-11-30 DIAGNOSIS — R918 Other nonspecific abnormal finding of lung field: Secondary | ICD-10-CM | POA: Diagnosis not present

## 2017-11-30 MED ORDER — TECHNETIUM TC 99M MEDRONATE IV KIT
25.0000 | PACK | Freq: Once | INTRAVENOUS | Status: AC | PRN
  Administered 2017-11-30: 22.2 via INTRAVENOUS

## 2017-12-07 DIAGNOSIS — C61 Malignant neoplasm of prostate: Secondary | ICD-10-CM | POA: Diagnosis not present

## 2017-12-07 DIAGNOSIS — R9721 Rising PSA following treatment for malignant neoplasm of prostate: Secondary | ICD-10-CM | POA: Diagnosis not present

## 2018-01-20 DIAGNOSIS — H6121 Impacted cerumen, right ear: Secondary | ICD-10-CM | POA: Diagnosis not present

## 2018-01-20 DIAGNOSIS — H6061 Unspecified chronic otitis externa, right ear: Secondary | ICD-10-CM | POA: Diagnosis not present

## 2018-02-01 DIAGNOSIS — F411 Generalized anxiety disorder: Secondary | ICD-10-CM | POA: Diagnosis not present

## 2018-02-01 DIAGNOSIS — E782 Mixed hyperlipidemia: Secondary | ICD-10-CM | POA: Diagnosis not present

## 2018-02-01 DIAGNOSIS — N183 Chronic kidney disease, stage 3 (moderate): Secondary | ICD-10-CM | POA: Diagnosis not present

## 2018-02-01 DIAGNOSIS — I10 Essential (primary) hypertension: Secondary | ICD-10-CM | POA: Diagnosis not present

## 2018-02-01 DIAGNOSIS — I700 Atherosclerosis of aorta: Secondary | ICD-10-CM | POA: Diagnosis not present

## 2018-02-01 DIAGNOSIS — C61 Malignant neoplasm of prostate: Secondary | ICD-10-CM | POA: Diagnosis not present

## 2018-02-01 DIAGNOSIS — Z6837 Body mass index (BMI) 37.0-37.9, adult: Secondary | ICD-10-CM | POA: Diagnosis not present

## 2018-02-01 DIAGNOSIS — F319 Bipolar disorder, unspecified: Secondary | ICD-10-CM | POA: Diagnosis not present

## 2018-02-17 DIAGNOSIS — Z85820 Personal history of malignant melanoma of skin: Secondary | ICD-10-CM | POA: Diagnosis not present

## 2018-02-17 DIAGNOSIS — Z1283 Encounter for screening for malignant neoplasm of skin: Secondary | ICD-10-CM | POA: Diagnosis not present

## 2018-02-17 DIAGNOSIS — Z08 Encounter for follow-up examination after completed treatment for malignant neoplasm: Secondary | ICD-10-CM | POA: Diagnosis not present

## 2018-03-09 DIAGNOSIS — Z8546 Personal history of malignant neoplasm of prostate: Secondary | ICD-10-CM | POA: Diagnosis not present

## 2018-07-02 DIAGNOSIS — Z8546 Personal history of malignant neoplasm of prostate: Secondary | ICD-10-CM | POA: Diagnosis not present

## 2018-07-09 DIAGNOSIS — R9721 Rising PSA following treatment for malignant neoplasm of prostate: Secondary | ICD-10-CM | POA: Diagnosis not present

## 2018-07-12 ENCOUNTER — Other Ambulatory Visit: Payer: Self-pay | Admitting: Urology

## 2018-07-12 DIAGNOSIS — C61 Malignant neoplasm of prostate: Secondary | ICD-10-CM

## 2018-07-26 DIAGNOSIS — H6121 Impacted cerumen, right ear: Secondary | ICD-10-CM | POA: Diagnosis not present

## 2018-07-26 DIAGNOSIS — H6061 Unspecified chronic otitis externa, right ear: Secondary | ICD-10-CM | POA: Diagnosis not present

## 2018-08-02 DIAGNOSIS — C61 Malignant neoplasm of prostate: Secondary | ICD-10-CM | POA: Diagnosis not present

## 2018-08-02 DIAGNOSIS — R911 Solitary pulmonary nodule: Secondary | ICD-10-CM | POA: Diagnosis not present

## 2018-08-02 DIAGNOSIS — R918 Other nonspecific abnormal finding of lung field: Secondary | ICD-10-CM | POA: Diagnosis not present

## 2018-08-03 DIAGNOSIS — C61 Malignant neoplasm of prostate: Secondary | ICD-10-CM | POA: Diagnosis not present

## 2018-08-03 DIAGNOSIS — F4001 Agoraphobia with panic disorder: Secondary | ICD-10-CM | POA: Diagnosis not present

## 2018-08-03 DIAGNOSIS — Z86718 Personal history of other venous thrombosis and embolism: Secondary | ICD-10-CM | POA: Diagnosis not present

## 2018-08-03 DIAGNOSIS — F411 Generalized anxiety disorder: Secondary | ICD-10-CM | POA: Diagnosis not present

## 2018-08-03 DIAGNOSIS — N183 Chronic kidney disease, stage 3 (moderate): Secondary | ICD-10-CM | POA: Diagnosis not present

## 2018-08-03 DIAGNOSIS — E669 Obesity, unspecified: Secondary | ICD-10-CM | POA: Diagnosis not present

## 2018-08-03 DIAGNOSIS — I129 Hypertensive chronic kidney disease with stage 1 through stage 4 chronic kidney disease, or unspecified chronic kidney disease: Secondary | ICD-10-CM | POA: Diagnosis not present

## 2018-08-03 DIAGNOSIS — Z6834 Body mass index (BMI) 34.0-34.9, adult: Secondary | ICD-10-CM | POA: Diagnosis not present

## 2018-08-03 DIAGNOSIS — E7800 Pure hypercholesterolemia, unspecified: Secondary | ICD-10-CM | POA: Diagnosis not present

## 2018-08-03 DIAGNOSIS — F319 Bipolar disorder, unspecified: Secondary | ICD-10-CM | POA: Diagnosis not present

## 2018-08-18 DIAGNOSIS — Z1283 Encounter for screening for malignant neoplasm of skin: Secondary | ICD-10-CM | POA: Diagnosis not present

## 2018-08-18 DIAGNOSIS — Z85820 Personal history of malignant melanoma of skin: Secondary | ICD-10-CM | POA: Diagnosis not present

## 2018-08-18 DIAGNOSIS — Z08 Encounter for follow-up examination after completed treatment for malignant neoplasm: Secondary | ICD-10-CM | POA: Diagnosis not present

## 2018-10-12 DIAGNOSIS — Z8546 Personal history of malignant neoplasm of prostate: Secondary | ICD-10-CM | POA: Diagnosis not present

## 2018-10-22 DIAGNOSIS — C61 Malignant neoplasm of prostate: Secondary | ICD-10-CM | POA: Diagnosis not present

## 2018-12-27 DIAGNOSIS — H6061 Unspecified chronic otitis externa, right ear: Secondary | ICD-10-CM | POA: Diagnosis not present

## 2018-12-27 DIAGNOSIS — J3089 Other allergic rhinitis: Secondary | ICD-10-CM | POA: Diagnosis not present

## 2018-12-27 DIAGNOSIS — J301 Allergic rhinitis due to pollen: Secondary | ICD-10-CM | POA: Diagnosis not present

## 2018-12-27 DIAGNOSIS — H6122 Impacted cerumen, left ear: Secondary | ICD-10-CM | POA: Diagnosis not present

## 2018-12-27 DIAGNOSIS — J3081 Allergic rhinitis due to animal (cat) (dog) hair and dander: Secondary | ICD-10-CM | POA: Diagnosis not present

## 2019-01-05 ENCOUNTER — Ambulatory Visit (HOSPITAL_COMMUNITY): Payer: PPO

## 2019-02-16 DIAGNOSIS — Z1283 Encounter for screening for malignant neoplasm of skin: Secondary | ICD-10-CM | POA: Diagnosis not present

## 2019-02-16 DIAGNOSIS — Z85820 Personal history of malignant melanoma of skin: Secondary | ICD-10-CM | POA: Diagnosis not present

## 2019-02-16 DIAGNOSIS — D225 Melanocytic nevi of trunk: Secondary | ICD-10-CM | POA: Diagnosis not present

## 2019-02-16 DIAGNOSIS — Z08 Encounter for follow-up examination after completed treatment for malignant neoplasm: Secondary | ICD-10-CM | POA: Diagnosis not present

## 2019-03-01 DIAGNOSIS — E669 Obesity, unspecified: Secondary | ICD-10-CM | POA: Diagnosis not present

## 2019-03-01 DIAGNOSIS — N183 Chronic kidney disease, stage 3 (moderate): Secondary | ICD-10-CM | POA: Diagnosis not present

## 2019-03-01 DIAGNOSIS — Z1211 Encounter for screening for malignant neoplasm of colon: Secondary | ICD-10-CM | POA: Diagnosis not present

## 2019-03-01 DIAGNOSIS — Z0000 Encounter for general adult medical examination without abnormal findings: Secondary | ICD-10-CM | POA: Diagnosis not present

## 2019-03-01 DIAGNOSIS — I129 Hypertensive chronic kidney disease with stage 1 through stage 4 chronic kidney disease, or unspecified chronic kidney disease: Secondary | ICD-10-CM | POA: Diagnosis not present

## 2019-03-01 DIAGNOSIS — C61 Malignant neoplasm of prostate: Secondary | ICD-10-CM | POA: Diagnosis not present

## 2019-03-01 DIAGNOSIS — Z86718 Personal history of other venous thrombosis and embolism: Secondary | ICD-10-CM | POA: Diagnosis not present

## 2019-03-01 DIAGNOSIS — I10 Essential (primary) hypertension: Secondary | ICD-10-CM | POA: Diagnosis not present

## 2019-03-01 DIAGNOSIS — F319 Bipolar disorder, unspecified: Secondary | ICD-10-CM | POA: Diagnosis not present

## 2019-03-01 DIAGNOSIS — F4001 Agoraphobia with panic disorder: Secondary | ICD-10-CM | POA: Diagnosis not present

## 2019-03-01 DIAGNOSIS — Z6830 Body mass index (BMI) 30.0-30.9, adult: Secondary | ICD-10-CM | POA: Diagnosis not present

## 2019-03-01 DIAGNOSIS — I700 Atherosclerosis of aorta: Secondary | ICD-10-CM | POA: Diagnosis not present

## 2019-03-18 DIAGNOSIS — Z8546 Personal history of malignant neoplasm of prostate: Secondary | ICD-10-CM | POA: Diagnosis not present

## 2019-03-25 DIAGNOSIS — C61 Malignant neoplasm of prostate: Secondary | ICD-10-CM | POA: Diagnosis not present

## 2019-03-31 DIAGNOSIS — C61 Malignant neoplasm of prostate: Secondary | ICD-10-CM | POA: Diagnosis not present

## 2019-04-06 DIAGNOSIS — Z1211 Encounter for screening for malignant neoplasm of colon: Secondary | ICD-10-CM | POA: Diagnosis not present

## 2019-06-22 DIAGNOSIS — H6123 Impacted cerumen, bilateral: Secondary | ICD-10-CM | POA: Diagnosis not present

## 2019-08-17 DIAGNOSIS — B078 Other viral warts: Secondary | ICD-10-CM | POA: Diagnosis not present

## 2019-08-17 DIAGNOSIS — D225 Melanocytic nevi of trunk: Secondary | ICD-10-CM | POA: Diagnosis not present

## 2019-08-17 DIAGNOSIS — Z85820 Personal history of malignant melanoma of skin: Secondary | ICD-10-CM | POA: Diagnosis not present

## 2019-08-17 DIAGNOSIS — Z08 Encounter for follow-up examination after completed treatment for malignant neoplasm: Secondary | ICD-10-CM | POA: Diagnosis not present

## 2019-08-17 DIAGNOSIS — Z1283 Encounter for screening for malignant neoplasm of skin: Secondary | ICD-10-CM | POA: Diagnosis not present

## 2019-09-06 DIAGNOSIS — F411 Generalized anxiety disorder: Secondary | ICD-10-CM | POA: Diagnosis not present

## 2019-09-06 DIAGNOSIS — C61 Malignant neoplasm of prostate: Secondary | ICD-10-CM | POA: Diagnosis not present

## 2019-09-06 DIAGNOSIS — E7800 Pure hypercholesterolemia, unspecified: Secondary | ICD-10-CM | POA: Diagnosis not present

## 2019-09-06 DIAGNOSIS — Z86718 Personal history of other venous thrombosis and embolism: Secondary | ICD-10-CM | POA: Diagnosis not present

## 2019-09-06 DIAGNOSIS — E663 Overweight: Secondary | ICD-10-CM | POA: Diagnosis not present

## 2019-09-06 DIAGNOSIS — N183 Chronic kidney disease, stage 3 unspecified: Secondary | ICD-10-CM | POA: Diagnosis not present

## 2019-09-06 DIAGNOSIS — R918 Other nonspecific abnormal finding of lung field: Secondary | ICD-10-CM | POA: Diagnosis not present

## 2019-09-06 DIAGNOSIS — I129 Hypertensive chronic kidney disease with stage 1 through stage 4 chronic kidney disease, or unspecified chronic kidney disease: Secondary | ICD-10-CM | POA: Diagnosis not present

## 2019-09-27 DIAGNOSIS — Z8546 Personal history of malignant neoplasm of prostate: Secondary | ICD-10-CM | POA: Diagnosis not present

## 2019-09-28 DIAGNOSIS — R918 Other nonspecific abnormal finding of lung field: Secondary | ICD-10-CM | POA: Diagnosis not present

## 2019-09-28 DIAGNOSIS — C61 Malignant neoplasm of prostate: Secondary | ICD-10-CM | POA: Diagnosis not present

## 2019-10-04 DIAGNOSIS — C7801 Secondary malignant neoplasm of right lung: Secondary | ICD-10-CM | POA: Diagnosis not present

## 2019-10-04 DIAGNOSIS — R9721 Rising PSA following treatment for malignant neoplasm of prostate: Secondary | ICD-10-CM | POA: Diagnosis not present

## 2019-10-04 DIAGNOSIS — C61 Malignant neoplasm of prostate: Secondary | ICD-10-CM | POA: Diagnosis not present

## 2019-10-28 IMAGING — NM NM BONE WHOLE BODY
2 series · 2 of 2 positions shown · non-contrast
Comparison: 03/29/2014

CLINICAL DATA: Prostate cancer

EXAM:
NUCLEAR MEDICINE WHOLE BODY BONE SCAN
TECHNIQUE: Whole body anterior and posterior images were obtained approximately
3 hours after intravenous injection of radiopharmaceutical.
RADIOPHARMACEUTICALS:  22.2 mCi 1echnetium-BBm MDP IV

[Series 1: wbr_bone_40 whole body · 2.66mm/px · 1 of 1 slices shown (1 of 2)]
[im 1/1]
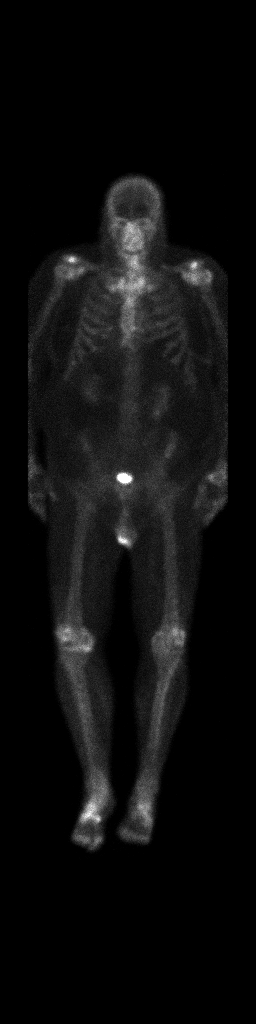

[Series 1: wbr_bone_40 whole body · 2.66mm/px · 1 of 1 slices shown (2 of 2)]
[im 1/1]
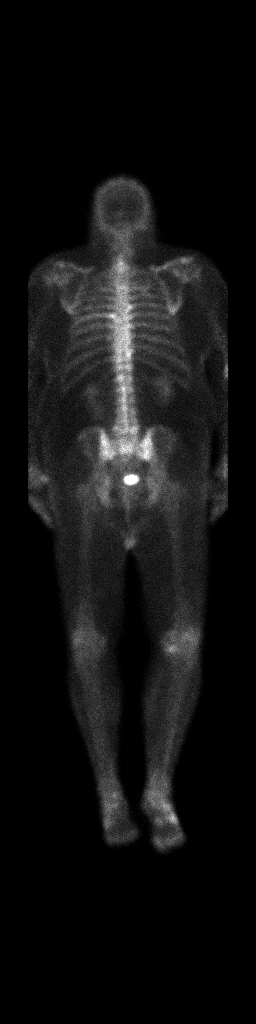

[2 of 2 positions shown; findings below may reference images not displayed]

FINDINGS: Degenerative uptake in the shoulders, wrists, knees, feet. Increased
activity at the costochondral junction in the midthoracic spine is
stable, typically degenerative. No suspicious bony uptake to suggest
bony metastatic disease. Soft tissue activity unremarkable.
IMPRESSION: Stable degenerative uptake as above. No evidence of bony metastatic
disease.

## 2019-11-18 ENCOUNTER — Other Ambulatory Visit: Payer: Self-pay

## 2019-11-18 ENCOUNTER — Ambulatory Visit: Payer: PPO | Attending: Internal Medicine

## 2019-11-18 DIAGNOSIS — Z23 Encounter for immunization: Secondary | ICD-10-CM | POA: Insufficient documentation

## 2019-11-18 NOTE — Progress Notes (Signed)
   Covid-19 Vaccination Clinic  Name:  Richard Delacruz    MRN: [id number] DOB: 16-Mar-1951  11/18/2019  Mr. Richard Delacruz was observed post Covid-19 immunization for 15 minutes without incidence. He was provided with Vaccine Information Sheet and instruction to access the V-Safe system.   Mr. Richard Delacruz was instructed to call 911 with any severe reactions post vaccine: Marland Kitchen Difficulty breathing  . Swelling of your face and throat  . A fast heartbeat  . A bad rash all over your body  . Dizziness and weakness    Immunizations Administered    Name Date Dose VIS Date Route   Pfizer COVID-19 Vaccine 11/18/2019 10:28 AM 0.3 mL 09/16/2019 Intramuscular   Manufacturer: [place]   Lot: [id number]   [place]: [id number]

## 2019-12-13 ENCOUNTER — Ambulatory Visit: Payer: PPO | Attending: Internal Medicine

## 2019-12-13 DIAGNOSIS — Z23 Encounter for immunization: Secondary | ICD-10-CM

## 2019-12-13 NOTE — Progress Notes (Signed)
   Covid-19 Vaccination Clinic  Name:  Richard Delacruz    MRN: [id number] DOB: [birth year]  12/13/2019  [name] was observed post Covid-19 immunization for 15 minutes without incident. He was provided with Vaccine Information Sheet and instruction to access the V-Safe system.   [name] was instructed to call 911 with any severe reactions post vaccine: Marland Kitchen Difficulty breathing  . Swelling of face and throat  . A fast heartbeat  . A bad rash all over body  . Dizziness and weakness   Immunizations Administered    Name Date Dose VIS Date Route   Pfizer COVID-19 Vaccine 12/13/2019 11:00 AM 0.3 mL 09/16/2019 Intramuscular   Manufacturer: [place]   Lot: [id number]   [place]: [id number]

## 2019-12-21 ENCOUNTER — Other Ambulatory Visit: Payer: Self-pay

## 2019-12-21 ENCOUNTER — Ambulatory Visit (INDEPENDENT_AMBULATORY_CARE_PROVIDER_SITE_OTHER): Payer: PPO | Admitting: Otolaryngology

## 2019-12-21 VITALS — Temp 97.3°F

## 2019-12-21 DIAGNOSIS — H6123 Impacted cerumen, bilateral: Secondary | ICD-10-CM | POA: Diagnosis not present

## 2019-12-21 NOTE — Progress Notes (Signed)
HPI: Richard Delacruz is a 69 y.o. male who presents for evaluation of cerumen impaction which is always worse on the right side.  He had previously seen Dr. Ernesto Rutherford every 6 months to have this cleaned..  Past Medical History:  Diagnosis Date  . Cancer   . Hypertension    No past surgical history on file. Social History   Socioeconomic History  . Marital status: Married    Spouse name: Not on file  . Number of children: Not on file  . Years of education: Not on file  . Highest education level: Not on file  Occupational History  . Not on file  Tobacco Use  . Smoking status: Never Smoker  Substance and Sexual Activity  . Alcohol use: No  . Drug use: No  . Sexual activity: Not on file  Other Topics Concern  . Not on file  Social History Narrative  . Not on file   Social Determinants of Health   Financial Resource Strain:   . Difficulty of Paying Living Expenses:   Food Insecurity:   . Worried About Charity fundraiser in the Last Year:   . Arboriculturist in the Last Year:   Transportation Needs:   . Film/video editor (Medical):   Marland Kitchen Lack of Transportation (Non-Medical):   Physical Activity:   . Days of Exercise per Week:   . Minutes of Exercise per Session:   Stress:   . Feeling of Stress :   Social Connections:   . Frequency of Communication with Friends and Family:   . Frequency of Social Gatherings with Friends and Family:   . Attends Religious Services:   . Active Member of Clubs or Organizations:   . Attends Archivist Meetings:   Marland Kitchen Marital Status:    No family history on file. Allergies  Allergen Reactions  . Atacand [Candesartan] Anaphylaxis  . Amlodipine     Swelling, joint and muscle pain   . Monopril [Fosinopril] Other (See Comments)    cough  . Naproxen Other (See Comments)    Abdominal pain   . Niaspan [Niacin Er] Rash   Prior to Admission medications   Medication Sig Start Date End Date Taking? Authorizing Provider   buPROPion (WELLBUTRIN SR) 150 MG 12 hr tablet Take 150 mg by mouth 2 (two) times daily. 04/25/14   [provider]  Calcium Carbonate-Vitamin D (CALCIUM + D PO) Take 1 tablet by mouth daily.    [provider]  clonazePAM (KLONOPIN) 1 MG tablet Take 1 mg by mouth 2 (two) times daily.  05/26/14   [provider]  Flaxseed, Linseed, (FLAX SEED OIL PO) Take 1 tablet by mouth daily.     [provider]  lamoTRIgine (LAMICTAL) 100 MG tablet Take 100 mg by mouth 2 (two) times daily. 03/06/14   [provider]  leuprolide (LUPRON) 22.5 MG injection Inject 22.5 mg into the muscle every 3 (three) months.    [provider]  lovastatin (MEVACOR) 40 MG tablet Take 40 mg by mouth at bedtime.  05/05/14   [provider]  metoprolol succinate (TOPROL-XL) 25 MG 24 hr tablet Take 25 mg by mouth daily. 03/06/14   [provider]  Omega-3 Fatty Acids (OMEGA 3 PO) Take 1 tablet by mouth daily.     [provider]  pantoprazole (PROTONIX) 40 MG tablet Take 40 mg by mouth daily.    [provider]  Rivaroxaban (XARELTO) 15 MG TABS  tablet Take 1 tablet (15 mg total) by mouth 2 (two) times daily. 06/08/14   Daleen Bo, MD     Positive ROS: Otherwise negative  All other systems have been reviewed and were otherwise negative with the exception of those mentioned in the HPI and as above.  Physical Exam: Constitutional: Alert, well-appearing, no acute distress Ears: External ears without lesions or tenderness. Ear canals with a large amount of wax on the right side and minimal wax on the left side.  This was cleaned with suction and curettes.  TMs are otherwise clear.. Nasal: External nose without lesions. Clear nasal passages Oral: Oropharynx clear. Neck: No palpable adenopathy or masses Respiratory: Breathing comfortably  Skin: No facial/neck lesions or rash noted.    Assessment: Cerumen impaction right side worse than  left  Plan: He will follow-up in 6 months for recheck at his request.  [name], MD

## 2020-02-15 DIAGNOSIS — Z08 Encounter for follow-up examination after completed treatment for malignant neoplasm: Secondary | ICD-10-CM | POA: Diagnosis not present

## 2020-02-15 DIAGNOSIS — Z1283 Encounter for screening for malignant neoplasm of skin: Secondary | ICD-10-CM | POA: Diagnosis not present

## 2020-02-15 DIAGNOSIS — D225 Melanocytic nevi of trunk: Secondary | ICD-10-CM | POA: Diagnosis not present

## 2020-02-15 DIAGNOSIS — L304 Erythema intertrigo: Secondary | ICD-10-CM | POA: Diagnosis not present

## 2020-02-15 DIAGNOSIS — Z85820 Personal history of malignant melanoma of skin: Secondary | ICD-10-CM | POA: Diagnosis not present

## 2020-02-24 DIAGNOSIS — H2513 Age-related nuclear cataract, bilateral: Secondary | ICD-10-CM | POA: Diagnosis not present

## 2020-03-06 DIAGNOSIS — I129 Hypertensive chronic kidney disease with stage 1 through stage 4 chronic kidney disease, or unspecified chronic kidney disease: Secondary | ICD-10-CM | POA: Diagnosis not present

## 2020-03-06 DIAGNOSIS — Z86718 Personal history of other venous thrombosis and embolism: Secondary | ICD-10-CM | POA: Diagnosis not present

## 2020-03-06 DIAGNOSIS — Z0000 Encounter for general adult medical examination without abnormal findings: Secondary | ICD-10-CM | POA: Diagnosis not present

## 2020-03-06 DIAGNOSIS — F319 Bipolar disorder, unspecified: Secondary | ICD-10-CM | POA: Diagnosis not present

## 2020-03-06 DIAGNOSIS — I700 Atherosclerosis of aorta: Secondary | ICD-10-CM | POA: Diagnosis not present

## 2020-03-06 DIAGNOSIS — R634 Abnormal weight loss: Secondary | ICD-10-CM | POA: Diagnosis not present

## 2020-03-06 DIAGNOSIS — E669 Obesity, unspecified: Secondary | ICD-10-CM | POA: Diagnosis not present

## 2020-03-06 DIAGNOSIS — D6869 Other thrombophilia: Secondary | ICD-10-CM | POA: Diagnosis not present

## 2020-03-06 DIAGNOSIS — C61 Malignant neoplasm of prostate: Secondary | ICD-10-CM | POA: Diagnosis not present

## 2020-03-06 DIAGNOSIS — Z6825 Body mass index (BMI) 25.0-25.9, adult: Secondary | ICD-10-CM | POA: Diagnosis not present

## 2020-03-06 DIAGNOSIS — Z1159 Encounter for screening for other viral diseases: Secondary | ICD-10-CM | POA: Diagnosis not present

## 2020-03-06 DIAGNOSIS — N183 Chronic kidney disease, stage 3 unspecified: Secondary | ICD-10-CM | POA: Diagnosis not present

## 2020-03-26 DIAGNOSIS — N410 Acute prostatitis: Secondary | ICD-10-CM | POA: Diagnosis not present

## 2020-03-26 DIAGNOSIS — N401 Enlarged prostate with lower urinary tract symptoms: Secondary | ICD-10-CM | POA: Diagnosis not present

## 2020-04-02 DIAGNOSIS — C61 Malignant neoplasm of prostate: Secondary | ICD-10-CM | POA: Diagnosis not present

## 2020-04-02 DIAGNOSIS — R9721 Rising PSA following treatment for malignant neoplasm of prostate: Secondary | ICD-10-CM | POA: Diagnosis not present

## 2020-05-08 ENCOUNTER — Other Ambulatory Visit: Payer: Self-pay

## 2020-05-08 ENCOUNTER — Ambulatory Visit (HOSPITAL_COMMUNITY)
Admission: RE | Admit: 2020-05-08 | Discharge: 2020-05-08 | Disposition: A | Discharge status: Home / Self Care | Payer: PPO | Source: Ambulatory Visit | Attending: Family Medicine | Admitting: Family Medicine

## 2020-05-08 ENCOUNTER — Other Ambulatory Visit (HOSPITAL_COMMUNITY): Payer: Self-pay | Admitting: Family Medicine

## 2020-05-08 DIAGNOSIS — M79604 Pain in right leg: Secondary | ICD-10-CM

## 2020-05-08 DIAGNOSIS — Z86718 Personal history of other venous thrombosis and embolism: Secondary | ICD-10-CM | POA: Diagnosis not present

## 2020-05-08 DIAGNOSIS — M7989 Other specified soft tissue disorders: Secondary | ICD-10-CM | POA: Diagnosis not present

## 2020-05-08 DIAGNOSIS — I82511 Chronic embolism and thrombosis of right femoral vein: Secondary | ICD-10-CM | POA: Diagnosis not present

## 2020-05-08 DIAGNOSIS — M79669 Pain in unspecified lower leg: Secondary | ICD-10-CM | POA: Diagnosis not present

## 2020-05-08 NOTE — Progress Notes (Signed)
Right lower extremity venous duplex completed. Refer to "CV Proc" under chart review to view preliminary results.  05/08/2020 1:20 PM Kelby Aline., MHA, RVT, RDCS, RDMS

## 2020-06-20 ENCOUNTER — Ambulatory Visit (INDEPENDENT_AMBULATORY_CARE_PROVIDER_SITE_OTHER): Payer: PPO | Admitting: Otolaryngology

## 2020-06-20 ENCOUNTER — Other Ambulatory Visit: Payer: Self-pay

## 2020-06-20 VITALS — Temp 97.5°F

## 2020-06-20 DIAGNOSIS — H6123 Impacted cerumen, bilateral: Secondary | ICD-10-CM

## 2020-06-20 NOTE — Progress Notes (Signed)
HPI: Richard Delacruz is a 69 y.o. male who presents for evaluation of wax buildup that he gets cleaned every 6 months..  Past Medical History:  Diagnosis Date   Cancer    Hypertension    No past surgical history on file. Social History   Socioeconomic History   Marital status: Married    Spouse name: Not on file   Number of children: Not on file   Years of education: Not on file   Highest education level: Not on file  Occupational History   Not on file  Tobacco Use   Smoking status: Never Smoker  Substance and Sexual Activity   Alcohol use: No   Drug use: No   Sexual activity: Not on file  Other Topics Concern   Not on file  Social History Narrative   Not on file   Social Determinants of Health   Financial Resource Strain:    Difficulty of Paying Living Expenses: Not on file  Food Insecurity:    Worried About [place] in the Last Year: Not on file   Ran Out of Food in the Last Year: Not on file  Transportation Needs:    Lack of Transportation (Medical): Not on file   Lack of Transportation (Non-Medical): Not on file  Physical Activity:    Days of Exercise per Week: Not on file   Minutes of Exercise per Session: Not on file  Stress:    Feeling of Stress : Not on file  Social Connections:    Frequency of Communication with Friends and Family: Not on file   Frequency of Social Gatherings with Friends and Family: Not on file   Attends Religious Services: Not on file   Active Member of Clubs or Organizations: Not on file   Attends Archivist Meetings: Not on file   Marital Status: Not on file   No family history on file. Allergies  Allergen Reactions   Atacand [Candesartan] Anaphylaxis   Amlodipine     Swelling, joint and muscle pain    Monopril [Fosinopril] Other (See Comments)    cough   Naproxen Other (See Comments)    Abdominal pain    Niaspan [Niacin Er] Rash   Prior to Admission medications    Medication Sig Start Date End Date Taking? Authorizing Provider  buPROPion (WELLBUTRIN SR) 150 MG 12 hr tablet Take 150 mg by mouth 2 (two) times daily. 04/25/14  Yes [provider]  Calcium Carbonate-Vitamin D (CALCIUM + D PO) Take 1 tablet by mouth daily.   Yes [provider]  clonazePAM (KLONOPIN) 1 MG tablet Take 1 mg by mouth 2 (two) times daily.  05/26/14  Yes [provider]  Flaxseed, Linseed, (FLAX SEED OIL PO) Take 1 tablet by mouth daily.    Yes [provider]  lamoTRIgine (LAMICTAL) 100 MG tablet Take 100 mg by mouth 2 (two) times daily. 03/06/14  Yes [provider]  leuprolide (LUPRON) 22.5 MG injection Inject 22.5 mg into the muscle every 3 (three) months.   Yes [provider]  lovastatin (MEVACOR) 40 MG tablet Take 40 mg by mouth at bedtime.  05/05/14  Yes [provider]  metoprolol succinate (TOPROL-XL) 25 MG 24 hr tablet Take 25 mg by mouth daily. 03/06/14  Yes [provider]  Omega-3 Fatty Acids (OMEGA 3 PO) Take 1 tablet by mouth daily.    Yes [provider]  pantoprazole (PROTONIX) 40 MG tablet Take 40 mg by mouth  daily.   Yes [provider]  Rivaroxaban (XARELTO) 15 MG TABS tablet Take 1 tablet (15 mg total) by mouth 2 (two) times daily. 06/08/14  Yes Daleen Bo, MD     Positive ROS: Otherwise negative  All other systems have been reviewed and were otherwise negative with the exception of those mentioned in the HPI and as above.  Physical Exam: Constitutional: Alert, well-appearing, no acute distress Ears: External ears without lesions or tenderness. Ear canals large amount of wax bilaterally.  This was cleaned with suction forceps.  The right ear canal had to be irrigated in order to remove the wax.  TMs were clear bilaterally.. Nasal: External nose without lesions. Clear nasal passages Oral: Oropharynx clear. Neck: No palpable adenopathy or masses Respiratory: Breathing  comfortably  Skin: No facial/neck lesions or rash noted.  Cerumen impaction removal  Date/Time: 06/20/2020 1:42 PM Performed by: Rozetta Nunnery, MD Authorized by: Rozetta Nunnery, MD   Consent:    Consent obtained:  Verbal   Consent given by:  Patient   Risks discussed:  Pain and bleeding Procedure details:    Location:  L ear and R ear   Procedure type: curette, suction and forceps   Post-procedure details:    Inspection:  TM intact and canal normal   Hearing quality:  Improved   Patient tolerance of procedure:  Tolerated well, no immediate complications Comments:     TMs are clear bilaterally.  The right ear canal had to be irrigated in order to remove the wax.    Assessment: Cerumen impactions worse on the right side  Plan: This was cleaned in the office.  He will follow-up in 6 to 8 months for recheck and cleaning.  [name], MD

## 2020-07-16 DIAGNOSIS — N183 Chronic kidney disease, stage 3 unspecified: Secondary | ICD-10-CM | POA: Diagnosis not present

## 2020-07-16 DIAGNOSIS — F319 Bipolar disorder, unspecified: Secondary | ICD-10-CM | POA: Diagnosis not present

## 2020-07-16 DIAGNOSIS — F411 Generalized anxiety disorder: Secondary | ICD-10-CM | POA: Diagnosis not present

## 2020-07-16 DIAGNOSIS — C61 Malignant neoplasm of prostate: Secondary | ICD-10-CM | POA: Diagnosis not present

## 2020-07-16 DIAGNOSIS — E7800 Pure hypercholesterolemia, unspecified: Secondary | ICD-10-CM | POA: Diagnosis not present

## 2020-07-16 DIAGNOSIS — I129 Hypertensive chronic kidney disease with stage 1 through stage 4 chronic kidney disease, or unspecified chronic kidney disease: Secondary | ICD-10-CM | POA: Diagnosis not present

## 2020-07-16 DIAGNOSIS — Z6825 Body mass index (BMI) 25.0-25.9, adult: Secondary | ICD-10-CM | POA: Diagnosis not present

## 2020-08-15 DIAGNOSIS — L821 Other seborrheic keratosis: Secondary | ICD-10-CM | POA: Diagnosis not present

## 2020-08-15 DIAGNOSIS — Z85820 Personal history of malignant melanoma of skin: Secondary | ICD-10-CM | POA: Diagnosis not present

## 2020-08-15 DIAGNOSIS — Z08 Encounter for follow-up examination after completed treatment for malignant neoplasm: Secondary | ICD-10-CM | POA: Diagnosis not present

## 2020-08-15 DIAGNOSIS — Z1283 Encounter for screening for malignant neoplasm of skin: Secondary | ICD-10-CM | POA: Diagnosis not present

## 2020-08-15 DIAGNOSIS — L304 Erythema intertrigo: Secondary | ICD-10-CM | POA: Diagnosis not present

## 2020-10-19 DIAGNOSIS — N183 Chronic kidney disease, stage 3 unspecified: Secondary | ICD-10-CM | POA: Diagnosis not present

## 2020-10-19 DIAGNOSIS — I129 Hypertensive chronic kidney disease with stage 1 through stage 4 chronic kidney disease, or unspecified chronic kidney disease: Secondary | ICD-10-CM | POA: Diagnosis not present

## 2020-10-19 DIAGNOSIS — C61 Malignant neoplasm of prostate: Secondary | ICD-10-CM | POA: Diagnosis not present

## 2020-10-19 DIAGNOSIS — I10 Essential (primary) hypertension: Secondary | ICD-10-CM | POA: Diagnosis not present

## 2020-10-19 DIAGNOSIS — E7800 Pure hypercholesterolemia, unspecified: Secondary | ICD-10-CM | POA: Diagnosis not present

## 2020-10-19 DIAGNOSIS — K219 Gastro-esophageal reflux disease without esophagitis: Secondary | ICD-10-CM | POA: Diagnosis not present

## 2020-10-19 DIAGNOSIS — F319 Bipolar disorder, unspecified: Secondary | ICD-10-CM | POA: Diagnosis not present

## 2020-11-13 DIAGNOSIS — L820 Inflamed seborrheic keratosis: Secondary | ICD-10-CM | POA: Diagnosis not present

## 2020-12-14 DIAGNOSIS — N183 Chronic kidney disease, stage 3 unspecified: Secondary | ICD-10-CM | POA: Diagnosis not present

## 2020-12-14 DIAGNOSIS — K219 Gastro-esophageal reflux disease without esophagitis: Secondary | ICD-10-CM | POA: Diagnosis not present

## 2020-12-14 DIAGNOSIS — C61 Malignant neoplasm of prostate: Secondary | ICD-10-CM | POA: Diagnosis not present

## 2020-12-14 DIAGNOSIS — F319 Bipolar disorder, unspecified: Secondary | ICD-10-CM | POA: Diagnosis not present

## 2020-12-14 DIAGNOSIS — I10 Essential (primary) hypertension: Secondary | ICD-10-CM | POA: Diagnosis not present

## 2020-12-14 DIAGNOSIS — E7800 Pure hypercholesterolemia, unspecified: Secondary | ICD-10-CM | POA: Diagnosis not present

## 2020-12-14 DIAGNOSIS — I129 Hypertensive chronic kidney disease with stage 1 through stage 4 chronic kidney disease, or unspecified chronic kidney disease: Secondary | ICD-10-CM | POA: Diagnosis not present

## 2020-12-19 ENCOUNTER — Ambulatory Visit (INDEPENDENT_AMBULATORY_CARE_PROVIDER_SITE_OTHER): Payer: PPO | Admitting: Otolaryngology

## 2020-12-19 ENCOUNTER — Encounter (INDEPENDENT_AMBULATORY_CARE_PROVIDER_SITE_OTHER): Payer: Self-pay | Admitting: Otolaryngology

## 2020-12-19 ENCOUNTER — Other Ambulatory Visit: Payer: Self-pay

## 2020-12-19 VITALS — Temp 96.8°F

## 2020-12-19 DIAGNOSIS — H6123 Impacted cerumen, bilateral: Secondary | ICD-10-CM

## 2020-12-19 NOTE — Progress Notes (Signed)
HPI: Richard Delacruz is a 70 y.o. male who presents for evaluation of wax buildup in his ears.  He follows up on a 107-month basis..  Past Medical History:  Diagnosis Date  . Cancer ([place])   . Hypertension    No past surgical history on file. Social History   Socioeconomic History  . Marital status: Married    Spouse name: Not on file  . Number of children: Not on file  . Years of education: Not on file  . Highest education level: Not on file  Occupational History  . Not on file  Tobacco Use  . Smoking status: Never Smoker  . Smokeless tobacco: Never Used  Substance and Sexual Activity  . Alcohol use: No  . Drug use: No  . Sexual activity: Not on file  Other Topics Concern  . Not on file  Social History Narrative  . Not on file   Social Determinants of Health   Financial Resource Strain: Not on file  Food Insecurity: Not on file  Transportation Needs: Not on file  Physical Activity: Not on file  Stress: Not on file  Social Connections: Not on file   No family history on file. Allergies  Allergen Reactions  . Atacand [Candesartan] Anaphylaxis  . Amlodipine     Swelling, joint and muscle pain   . Monopril [Fosinopril] Other (See Comments)    cough  . Naproxen Other (See Comments)    Abdominal pain   . Niaspan [Niacin Er] Rash   Prior to Admission medications   Medication Sig Start Date End Date Taking? Authorizing Provider  buPROPion (WELLBUTRIN SR) 150 MG 12 hr tablet Take 150 mg by mouth 2 (two) times daily. 04/25/14   [provider]  Calcium Carbonate-Vitamin D (CALCIUM + D PO) Take 1 tablet by mouth daily.    [provider]  clonazePAM (KLONOPIN) 1 MG tablet Take 1 mg by mouth 2 (two) times daily.  05/26/14   [provider]  Flaxseed, Linseed, (FLAX SEED OIL PO) Take 1 tablet by mouth daily.     [provider]  lamoTRIgine (LAMICTAL) 100 MG tablet Take 100 mg by mouth 2 (two) times daily. 03/06/14   [provider]  leuprolide (LUPRON) 22.5 MG injection Inject 22.5 mg into the muscle every 3 (three) months.    [provider]  lovastatin (MEVACOR) 40 MG tablet Take 40 mg by mouth at bedtime.  05/05/14   [provider]  metoprolol succinate (TOPROL-XL) 25 MG 24 hr tablet Take 25 mg by mouth daily. 03/06/14   [provider]  Omega-3 Fatty Acids (OMEGA 3 PO) Take 1 tablet by mouth daily.     [provider]  pantoprazole (PROTONIX) 40 MG tablet Take 40 mg by mouth daily.    [provider]  Rivaroxaban (XARELTO) 15 MG TABS tablet Take 1 tablet (15 mg total) by mouth 2 (two) times daily. 06/08/14   Daleen Bo, MD     Positive ROS: Otherwise negative  All other systems have been reviewed and were otherwise negative with the exception of those mentioned in the HPI and as above.  Physical Exam: Constitutional: Alert, well-appearing, no acute distress Ears: External ears without lesions or tenderness. Ear canals with a mild amount of wax in both ear canals that was cleaned with curettes and suction.  TMs were otherwise clear.. Nasal: External nose without lesions. Clear nasal passages Oral: Oropharynx clear. Neck: No palpable adenopathy or masses Respiratory: Breathing comfortably  Skin: No facial/neck lesions or rash noted.  Cerumen impaction removal  Date/Time: 12/19/2020 12:44 PM Performed by: Rozetta Nunnery, MD Authorized by: Rozetta Nunnery, MD   Consent:    Consent obtained:  Verbal   Consent given by:  Patient   Risks discussed:  Pain and bleeding Procedure details:    Location:  L ear and R ear   Procedure type: curette and suction   Post-procedure details:    Inspection:  TM intact and canal normal   Hearing quality:  Improved   Patient tolerance of procedure:  Tolerated well, no immediate complications Comments:     TMs are otherwise clear.    Assessment: Bilateral cerumen buildup.  Plan: He will follow-up in 6  months as he has requested.  [name], MD

## 2021-01-21 DIAGNOSIS — K219 Gastro-esophageal reflux disease without esophagitis: Secondary | ICD-10-CM | POA: Diagnosis not present

## 2021-01-21 DIAGNOSIS — C61 Malignant neoplasm of prostate: Secondary | ICD-10-CM | POA: Diagnosis not present

## 2021-01-21 DIAGNOSIS — F319 Bipolar disorder, unspecified: Secondary | ICD-10-CM | POA: Diagnosis not present

## 2021-01-21 DIAGNOSIS — E7800 Pure hypercholesterolemia, unspecified: Secondary | ICD-10-CM | POA: Diagnosis not present

## 2021-01-21 DIAGNOSIS — I129 Hypertensive chronic kidney disease with stage 1 through stage 4 chronic kidney disease, or unspecified chronic kidney disease: Secondary | ICD-10-CM | POA: Diagnosis not present

## 2021-01-21 DIAGNOSIS — I10 Essential (primary) hypertension: Secondary | ICD-10-CM | POA: Diagnosis not present

## 2021-01-21 DIAGNOSIS — N183 Chronic kidney disease, stage 3 unspecified: Secondary | ICD-10-CM | POA: Diagnosis not present

## 2021-02-13 DIAGNOSIS — Z08 Encounter for follow-up examination after completed treatment for malignant neoplasm: Secondary | ICD-10-CM | POA: Diagnosis not present

## 2021-02-13 DIAGNOSIS — Z85820 Personal history of malignant melanoma of skin: Secondary | ICD-10-CM | POA: Diagnosis not present

## 2021-02-13 DIAGNOSIS — D225 Melanocytic nevi of trunk: Secondary | ICD-10-CM | POA: Diagnosis not present

## 2021-02-13 DIAGNOSIS — Z1283 Encounter for screening for malignant neoplasm of skin: Secondary | ICD-10-CM | POA: Diagnosis not present

## 2021-03-19 DIAGNOSIS — K219 Gastro-esophageal reflux disease without esophagitis: Secondary | ICD-10-CM | POA: Diagnosis not present

## 2021-03-19 DIAGNOSIS — I10 Essential (primary) hypertension: Secondary | ICD-10-CM | POA: Diagnosis not present

## 2021-03-19 DIAGNOSIS — C61 Malignant neoplasm of prostate: Secondary | ICD-10-CM | POA: Diagnosis not present

## 2021-03-19 DIAGNOSIS — N183 Chronic kidney disease, stage 3 unspecified: Secondary | ICD-10-CM | POA: Diagnosis not present

## 2021-03-19 DIAGNOSIS — F319 Bipolar disorder, unspecified: Secondary | ICD-10-CM | POA: Diagnosis not present

## 2021-03-19 DIAGNOSIS — E7800 Pure hypercholesterolemia, unspecified: Secondary | ICD-10-CM | POA: Diagnosis not present

## 2021-03-25 DIAGNOSIS — R413 Other amnesia: Secondary | ICD-10-CM | POA: Diagnosis not present

## 2021-03-25 DIAGNOSIS — F411 Generalized anxiety disorder: Secondary | ICD-10-CM | POA: Diagnosis not present

## 2021-03-25 DIAGNOSIS — F4001 Agoraphobia with panic disorder: Secondary | ICD-10-CM | POA: Diagnosis not present

## 2021-03-25 DIAGNOSIS — I129 Hypertensive chronic kidney disease with stage 1 through stage 4 chronic kidney disease, or unspecified chronic kidney disease: Secondary | ICD-10-CM | POA: Diagnosis not present

## 2021-03-25 DIAGNOSIS — Z1389 Encounter for screening for other disorder: Secondary | ICD-10-CM | POA: Diagnosis not present

## 2021-03-25 DIAGNOSIS — I700 Atherosclerosis of aorta: Secondary | ICD-10-CM | POA: Diagnosis not present

## 2021-03-25 DIAGNOSIS — C61 Malignant neoplasm of prostate: Secondary | ICD-10-CM | POA: Diagnosis not present

## 2021-03-25 DIAGNOSIS — Z0000 Encounter for general adult medical examination without abnormal findings: Secondary | ICD-10-CM | POA: Diagnosis not present

## 2021-03-25 DIAGNOSIS — N183 Chronic kidney disease, stage 3 unspecified: Secondary | ICD-10-CM | POA: Diagnosis not present

## 2021-03-25 DIAGNOSIS — F319 Bipolar disorder, unspecified: Secondary | ICD-10-CM | POA: Diagnosis not present

## 2021-03-25 DIAGNOSIS — D6869 Other thrombophilia: Secondary | ICD-10-CM | POA: Diagnosis not present

## 2021-03-29 DIAGNOSIS — Z8546 Personal history of malignant neoplasm of prostate: Secondary | ICD-10-CM | POA: Diagnosis not present

## 2021-04-03 ENCOUNTER — Other Ambulatory Visit (HOSPITAL_COMMUNITY): Payer: Self-pay | Admitting: Urology

## 2021-04-03 DIAGNOSIS — R9721 Rising PSA following treatment for malignant neoplasm of prostate: Secondary | ICD-10-CM

## 2021-04-22 ENCOUNTER — Ambulatory Visit (HOSPITAL_COMMUNITY)
Admission: RE | Admit: 2021-04-22 | Discharge: 2021-04-22 | Disposition: A | Discharge status: Home / Self Care | Payer: PPO | Source: Ambulatory Visit | Attending: Urology | Admitting: Urology

## 2021-04-22 ENCOUNTER — Other Ambulatory Visit: Payer: Self-pay

## 2021-04-22 DIAGNOSIS — K219 Gastro-esophageal reflux disease without esophagitis: Secondary | ICD-10-CM | POA: Diagnosis not present

## 2021-04-22 DIAGNOSIS — N183 Chronic kidney disease, stage 3 unspecified: Secondary | ICD-10-CM | POA: Diagnosis not present

## 2021-04-22 DIAGNOSIS — C61 Malignant neoplasm of prostate: Secondary | ICD-10-CM | POA: Diagnosis not present

## 2021-04-22 DIAGNOSIS — R9721 Rising PSA following treatment for malignant neoplasm of prostate: Secondary | ICD-10-CM | POA: Insufficient documentation

## 2021-04-22 DIAGNOSIS — E7800 Pure hypercholesterolemia, unspecified: Secondary | ICD-10-CM | POA: Diagnosis not present

## 2021-04-22 DIAGNOSIS — F319 Bipolar disorder, unspecified: Secondary | ICD-10-CM | POA: Diagnosis not present

## 2021-04-22 DIAGNOSIS — I10 Essential (primary) hypertension: Secondary | ICD-10-CM | POA: Diagnosis not present

## 2021-04-22 MED ORDER — PIFLIFOLASTAT F 18 (PYLARIFY) INJECTION
9.0000 | Freq: Once | INTRAVENOUS | Status: AC
  Administered 2021-04-22: 9.63 via INTRAVENOUS

## 2021-05-29 DIAGNOSIS — Z86010 Personal history of colonic polyps: Secondary | ICD-10-CM | POA: Diagnosis not present

## 2021-05-29 DIAGNOSIS — Z86718 Personal history of other venous thrombosis and embolism: Secondary | ICD-10-CM | POA: Diagnosis not present

## 2021-05-29 DIAGNOSIS — K5900 Constipation, unspecified: Secondary | ICD-10-CM | POA: Diagnosis not present

## 2021-06-13 DIAGNOSIS — K219 Gastro-esophageal reflux disease without esophagitis: Secondary | ICD-10-CM | POA: Diagnosis not present

## 2021-06-13 DIAGNOSIS — I10 Essential (primary) hypertension: Secondary | ICD-10-CM | POA: Diagnosis not present

## 2021-06-13 DIAGNOSIS — E7800 Pure hypercholesterolemia, unspecified: Secondary | ICD-10-CM | POA: Diagnosis not present

## 2021-06-13 DIAGNOSIS — F319 Bipolar disorder, unspecified: Secondary | ICD-10-CM | POA: Diagnosis not present

## 2021-06-13 DIAGNOSIS — N183 Chronic kidney disease, stage 3 unspecified: Secondary | ICD-10-CM | POA: Diagnosis not present

## 2021-06-19 ENCOUNTER — Ambulatory Visit (INDEPENDENT_AMBULATORY_CARE_PROVIDER_SITE_OTHER): Payer: PPO | Admitting: Otolaryngology

## 2021-06-19 ENCOUNTER — Other Ambulatory Visit: Payer: Self-pay

## 2021-06-19 DIAGNOSIS — H6123 Impacted cerumen, bilateral: Secondary | ICD-10-CM | POA: Diagnosis not present

## 2021-06-19 NOTE — Progress Notes (Signed)
HPI: Richard Delacruz is a 70 y.o. male who presents for evaluation of wax buildup in his ears for 67-monthfollow-up..Marland Kitchen Past Medical History:  Diagnosis Date   [place]    [place]    No past surgical history on file. Social History   Socioeconomic History   Marital status: Married    Spouse name: Not on file   Number of children: Not on file   Years of education: Not on file   Highest education level: Not on file  Occupational History   Not on file  Tobacco Use   Smoking status: Never   Smokeless tobacco: Never  Substance and Sexual Activity   Alcohol use: No   Drug use: No   Sexual activity: Not on file  Other Topics Concern   Not on file  Social History Narrative   Not on file   Social Determinants of Health   Financial Resource Strain: Not on file  Food Insecurity: Not on file  Transportation Needs: Not on file  Physical Activity: Not on file  Stress: Not on file  Social Connections: Not on file   No family history on file. Allergies  Allergen Reactions   Atacand [Candesartan] Anaphylaxis   Amlodipine     Swelling, joint and muscle pain    Monopril [Fosinopril] Other (See Comments)    cough   Naproxen Other (See Comments)    Abdominal pain    Niaspan [Niacin Er] Rash   Prior to Admission medications   Medication Sig Start Date End Date Taking? Authorizing Provider  buPROPion (WELLBUTRIN SR) 150 MG 12 hr tablet Take 150 mg by mouth 2 (two) times daily. 04/25/14   [provider]  Calcium Carbonate-Vitamin D (CALCIUM + D PO) Take 1 tablet by mouth daily.    [provider]  clonazePAM (KLONOPIN) 1 MG tablet Take 1 mg by mouth 2 (two) times daily.  05/26/14   [provider]  Flaxseed, Linseed, (FLAX SEED OIL PO) Take 1 tablet by mouth daily.     [provider]  lamoTRIgine (LAMICTAL) 100 MG tablet Take 100 mg by mouth 2 (two) times daily. 03/06/14   [provider]  leuprolide (LUPRON) 22.5 MG injection  Inject 22.5 mg into the muscle every 3 (three) months.    [provider]  lovastatin (MEVACOR) 40 MG tablet Take 40 mg by mouth at bedtime.  05/05/14   [provider]  metoprolol succinate (TOPROL-XL) 25 MG 24 hr tablet Take 25 mg by mouth daily. 03/06/14   [provider]  Omega-3 Fatty Acids (OMEGA 3 PO) Take 1 tablet by mouth daily.     [provider]  pantoprazole (PROTONIX) 40 MG tablet Take 40 mg by mouth daily.    [provider]  Rivaroxaban (XARELTO) 15 MG TABS tablet Take 1 tablet (15 mg total) by mouth 2 (two) times daily. 06/08/14   WDaleen Bo MD     Positive ROS: Otherwise negative  All other systems have been reviewed and were otherwise negative with the exception of those mentioned in the HPI and as above.  Physical Exam: Constitutional: Alert, well-appearing, no acute distress Ears: External ears without lesions or tenderness. Ear canals with a mod amount of wax in both ear canals that was cleaned with suction.  Ear canals and TMs were otherwise clear.. Nasal: External nose without lesions. Clear nasal passages Oral: Oropharynx clear. Neck: No palpable adenopathy or masses Respiratory: Breathing comfortably  Skin: No facial/neck lesions or  rash noted.  Cerumen impaction removal  Date/Time: 06/19/2021 1:11 PM Performed by: Rozetta Nunnery, MD Authorized by: Rozetta Nunnery, MD   Consent:    Consent obtained:  Verbal   Consent given by:  Patient   Risks discussed:  Pain and bleeding Procedure details:    Location:  L ear and R ear   Procedure type: curette and suction   Post-procedure details:    Inspection:  TM intact and canal normal   Hearing quality:  Improved   Procedure completion:  Tolerated well, no immediate complications Comments:     Ear canals with a moderate amount of wax that was cleaned with suction and curettes.  TMs were otherwise clear.  Assessment: Cerumen buildup  bilaterally.  Plan: This was cleaned in the office. He will follow-up as needed.  [name], MD

## 2021-09-10 DIAGNOSIS — I10 Essential (primary) hypertension: Secondary | ICD-10-CM | POA: Diagnosis not present

## 2021-09-10 DIAGNOSIS — E782 Mixed hyperlipidemia: Secondary | ICD-10-CM | POA: Diagnosis not present

## 2021-09-10 DIAGNOSIS — K219 Gastro-esophageal reflux disease without esophagitis: Secondary | ICD-10-CM | POA: Diagnosis not present

## 2021-09-10 DIAGNOSIS — N183 Chronic kidney disease, stage 3 unspecified: Secondary | ICD-10-CM | POA: Diagnosis not present

## 2021-09-10 DIAGNOSIS — F319 Bipolar disorder, unspecified: Secondary | ICD-10-CM | POA: Diagnosis not present

## 2021-09-25 DIAGNOSIS — Z86718 Personal history of other venous thrombosis and embolism: Secondary | ICD-10-CM | POA: Diagnosis not present

## 2021-09-25 DIAGNOSIS — F319 Bipolar disorder, unspecified: Secondary | ICD-10-CM | POA: Diagnosis not present

## 2021-09-25 DIAGNOSIS — R232 Flushing: Secondary | ICD-10-CM | POA: Diagnosis not present

## 2021-09-25 DIAGNOSIS — F4001 Agoraphobia with panic disorder: Secondary | ICD-10-CM | POA: Diagnosis not present

## 2021-09-25 DIAGNOSIS — E7800 Pure hypercholesterolemia, unspecified: Secondary | ICD-10-CM | POA: Diagnosis not present

## 2021-10-03 DIAGNOSIS — Z1211 Encounter for screening for malignant neoplasm of colon: Secondary | ICD-10-CM | POA: Diagnosis not present

## 2021-10-14 DIAGNOSIS — E782 Mixed hyperlipidemia: Secondary | ICD-10-CM | POA: Diagnosis not present

## 2021-10-14 DIAGNOSIS — I10 Essential (primary) hypertension: Secondary | ICD-10-CM | POA: Diagnosis not present

## 2021-10-14 DIAGNOSIS — F319 Bipolar disorder, unspecified: Secondary | ICD-10-CM | POA: Diagnosis not present

## 2021-10-14 DIAGNOSIS — K219 Gastro-esophageal reflux disease without esophagitis: Secondary | ICD-10-CM | POA: Diagnosis not present

## 2021-10-14 DIAGNOSIS — N183 Chronic kidney disease, stage 3 unspecified: Secondary | ICD-10-CM | POA: Diagnosis not present

## 2021-11-19 ENCOUNTER — Encounter (HOSPITAL_COMMUNITY): Payer: Self-pay | Admitting: Emergency Medicine

## 2021-11-19 ENCOUNTER — Emergency Department (HOSPITAL_COMMUNITY): Payer: PPO

## 2021-11-19 ENCOUNTER — Other Ambulatory Visit: Payer: Self-pay

## 2021-11-19 ENCOUNTER — Emergency Department (HOSPITAL_COMMUNITY)
Admission: EM | Admit: 2021-11-19 | Discharge: 2021-11-19 | Disposition: A | Discharge status: Home / Self Care | Payer: PPO | Attending: Emergency Medicine | Admitting: Emergency Medicine

## 2021-11-19 DIAGNOSIS — R079 Chest pain, unspecified: Secondary | ICD-10-CM

## 2021-11-19 DIAGNOSIS — R0789 Other chest pain: Secondary | ICD-10-CM | POA: Diagnosis not present

## 2021-11-19 DIAGNOSIS — R0602 Shortness of breath: Secondary | ICD-10-CM | POA: Insufficient documentation

## 2021-11-19 DIAGNOSIS — I10 Essential (primary) hypertension: Secondary | ICD-10-CM | POA: Insufficient documentation

## 2021-11-19 DIAGNOSIS — Z79899 Other long term (current) drug therapy: Secondary | ICD-10-CM | POA: Diagnosis not present

## 2021-11-19 LAB — CBC
HCT: 48.7 % (ref 39.0–52.0)
Hemoglobin: 16.3 g/dL (ref 13.0–17.0)
MCH: 31.7 pg (ref 26.0–34.0)
MCHC: 33.5 g/dL (ref 30.0–36.0)
MCV: 94.7 fL (ref 80.0–100.0)
Platelets: 179 10*3/uL (ref 150–400)
RBC: 5.14 MIL/uL (ref 4.22–5.81)
RDW: 12.5 % (ref 11.5–15.5)
WBC: 12.1 10*3/uL — ABNORMAL HIGH (ref 4.0–10.5)
nRBC: 0 % (ref 0.0–0.2)

## 2021-11-19 LAB — BASIC METABOLIC PANEL
Anion gap: 10 (ref 5–15)
BUN: 13 mg/dL (ref 8–23)
CO2: 29 mmol/L (ref 22–32)
Calcium: 10.4 mg/dL — ABNORMAL HIGH (ref 8.9–10.3)
Chloride: 98 mmol/L (ref 98–111)
Creatinine, Ser: 1.1 mg/dL (ref 0.61–1.24)
GFR, Estimated: >60 mL/min (ref >60)
Glucose, Bld: 118 mg/dL — ABNORMAL HIGH (ref 70–99)
Potassium: 4.9 mmol/L (ref 3.5–5.1)
Sodium: 137 mmol/L (ref 135–145)

## 2021-11-19 LAB — TROPONIN I (HIGH SENSITIVITY): Troponin I (High Sensitivity): 6 ng/L (ref <18)

## 2021-11-19 MED ORDER — NITROGLYCERIN 0.4 MG SL SUBL: 0.4000 mg | SUBLINGUAL_TABLET | SUBLINGUAL | Status: DC | PRN

## 2021-11-19 MED ORDER — ACETAMINOPHEN 500 MG PO TABS: 1000.0000 mg | ORAL_TABLET | Freq: Once | ORAL | Status: DC

## 2021-11-19 NOTE — ED Provider Notes (Signed)
[facility] EMERGENCY DEPARTMENT Provider Note   CSN: [id number] Arrival date & time: 11/19/21  1221     History  Chief Complaint  Patient presents with   Chest Pain    Richard Delacruz is a 71 y.o. male with past medical history significant for hypertension, hypercholesterolemia who presents with concern for acute onset left-sided chest pain that began 5a this morning.  Patient reports chest pain felt like pressure sitting on his chest, somewhat relieved with walking, worse with sitting down or laying down.  Not associated with exertion that he can tell.  Patient does endorse some shortness of breath, denies nausea, vomiting, diaphoresis.  Patient denies radiation to his neck or his arm.  Patient has not had a prior history of ACS, stroke, coronary artery disease.  He reports 1 similar episode of chest pain around 30 years ago without work-up, he has not seen a cardiologist in the past.  Patient reports strong family history of ACS, CAD.  Patient has not recently had a stroke.  Patient denies any recent cough, fever, chills, abdominal pain.  Patient endorses he is having some ongoing chest pain, reports that it is lessened, around 5 out of 10 at this time.  Does have some low back pain as well.  Denies any injury to the low back.   Chest Pain Associated symptoms: shortness of breath       Home Medications Prior to Admission medications   Medication Sig Start Date End Date Taking? Authorizing Provider  buPROPion (WELLBUTRIN SR) 150 MG 12 hr tablet Take 150 mg by mouth 2 (two) times daily. 04/25/14   [provider]  Calcium Carbonate-Vitamin D (CALCIUM + D PO) Take 1 tablet by mouth daily.    [provider]  clonazePAM (KLONOPIN) 1 MG tablet Take 1 mg by mouth 2 (two) times daily.  05/26/14   [provider]  Flaxseed, Linseed, (FLAX SEED OIL PO) Take 1 tablet by mouth daily.     [provider]  lamoTRIgine (LAMICTAL) 100 MG tablet  Take 100 mg by mouth 2 (two) times daily. 03/06/14   [provider]  leuprolide (LUPRON) 22.5 MG injection Inject 22.5 mg into the muscle every 3 (three) months.    [provider]  lovastatin (MEVACOR) 40 MG tablet Take 40 mg by mouth at bedtime.  05/05/14   [provider]  metoprolol succinate (TOPROL-XL) 25 MG 24 hr tablet Take 25 mg by mouth daily. 03/06/14   [provider]  Omega-3 Fatty Acids (OMEGA 3 PO) Take 1 tablet by mouth daily.     [provider]  pantoprazole (PROTONIX) 40 MG tablet Take 40 mg by mouth daily.    [provider]  Rivaroxaban (XARELTO) 15 MG TABS tablet Take 1 tablet (15 mg total) by mouth 2 (two) times daily. 06/08/14   Daleen Bo, MD      Allergies    Atacand [candesartan], Amlodipine, Monopril [fosinopril], Naproxen, and Niaspan [niacin er]    Review of Systems   Review of Systems  Respiratory:  Positive for shortness of breath.   Cardiovascular:  Positive for chest pain.  All other systems reviewed and are negative.  Physical Exam Updated Vital Signs BP (!) 151/88 (BP Location: Left Arm)    Pulse 65    Temp 99.3 F (37.4 C) (Oral)    Resp 16    Ht 5\' 9"  (1.753 m)    Wt 86.2 kg    SpO2 97%  BMI 28.06 kg/m  Physical Exam Vitals and nursing note reviewed.  Constitutional:      General: He is not in acute distress.    Appearance: Normal appearance.  HENT:     Head: Normocephalic and atraumatic.  Eyes:     General:        Right eye: No discharge.        Left eye: No discharge.  Cardiovascular:     Rate and Rhythm: Normal rate and regular rhythm.     Heart sounds: No murmur heard.   No friction rub. No gallop.  Pulmonary:     Effort: Pulmonary effort is normal.     Breath sounds: Normal breath sounds.  Chest:     Comments: Patient with some minimal tenderness to palpation of the chest that he reports is dissimilar from the pain that he is feeling Abdominal:     General: Bowel sounds are  normal.     Palpations: Abdomen is soft.  Skin:    General: Skin is warm and dry.     Capillary Refill: Capillary refill takes less than 2 seconds.  Neurological:     Mental Status: He is alert and oriented to person, place, and time.  Psychiatric:        Mood and Affect: Mood normal.        Behavior: Behavior normal.    ED Results / Procedures / Treatments   Labs (all labs ordered are listed, but only abnormal results are displayed) Labs Reviewed  BASIC METABOLIC PANEL - Abnormal; Notable for the following components:      Result Value   Glucose, Bld 118 (*)    Calcium 10.4 (*)    All other components within normal limits  CBC - Abnormal; Notable for the following components:   WBC 12.1 (*)    All other components within normal limits  RESP PANEL BY RT-PCR (FLU A&B, COVID) ARPGX2  TROPONIN I (HIGH SENSITIVITY)  TROPONIN I (HIGH SENSITIVITY)    EKG EKG Interpretation  Date/Time:  Tuesday November 19 2021 12:27:50 EST Ventricular Rate:  65 PR Interval:  178 QRS Duration: 98 QT Interval:  394 QTC Calculation: 409 R Axis:   -55 Text Interpretation: Normal sinus rhythm Left anterior fascicular block Abnormal ECG No previous ECGs available Confirmed by Dene Gentry [phone]) on 11/19/2021 2:21:05 PM  Radiology DG Chest 2 View  Result Date: 11/19/2021 CLINICAL DATA:  Left-sided chest pain and shortness of breath EXAM: CHEST - 2 VIEW COMPARISON:  09/28/2019 chest CT from [facility]. FINDINGS: Midline trachea. Normal heart size and mediastinal contours. No pleural effusion or pneumothorax. Mild right hemidiaphragm elevation. Mild volume loss at both lung bases. IMPRESSION: No acute cardiopulmonary disease. Electronically Signed   By: Abigail Miyamoto M.D.   On: 11/19/2021 12:56    Procedures Procedures    Medications Ordered in ED Medications  acetaminophen (TYLENOL) tablet 1,000 mg (has no administration in time range)  nitroGLYCERIN (NITROSTAT) SL tablet 0.4 mg (has no  administration in time range)    ED Course/ Medical Decision Making/ A&P                           Medical Decision Making Risk OTC drugs.   I discussed this case with my attending physician who cosigned this note including patient's presenting symptoms, physical exam, and planned diagnostics and interventions. Attending physician stated agreement with plan or made changes to plan which were implemented.  This patient presents to the ED for concern of chest pain, shortness of breath, low back pain, this involves an extensive number of treatment options, and is a complaint that carries with it a high risk of complications and morbidity. The emergent differential diagnosis prior to evaluation includes, but is not limited to, ACS, PE, dissection, aneurysm, Boerhaave's, Mallory-Weiss, tamponade, pericarditis, myocarditis, endocarditis, pneumonia, acid reflux, mesenteric ischemia, cholecystitis versus other.  This is not an exhaustive differential.   Past Medical History / Co-morbidities: Hypertension, hyperlipidemia, history of gastric ulcers, acid reflux  Additional history: Additional history obtained from patient's spouse. External records from outside source obtained and reviewed including primary care office visit.  Physical Exam: Physical exam performed. The pertinent findings include: Some tenderness palpation of the chest wall that complicate patient's pain.  He is overall well-appearing in no acute distress.  Lab Tests: I ordered, and personally interpreted labs.  The pertinent results include: Minimally elevated white blood cells, 12.1.  BMP overall unremarkable, mild hyperglycemia glucose 118.  Negative troponin x1 in context of chest pain that is ongoing for greater than 6 hours prior to draw   Imaging Studies: I ordered imaging studies including plain film radiographs of the chest. I independently visualized and interpreted imaging which showed no intrathoracic abnormality. I  agree with the radiologist interpretation.   Cardiac Monitoring:  The patient was maintained on a cardiac monitor.  Reviewed and interpreted EKG shows normal sinus rhythm, no ischemic changes noted. My attending physician Dr. Francia Greaves independently viewed and interpreted the cardiac monitored which showed an underlying rhythm of: Normal sinus rhythm, no ischemic changes noted.   Medications: I ordered medication including tylenol, nitro for chest pain. Reevaluation of the patient after these medicines showed that the patient stayed the same. I have reviewed the patients home medicines and have made adjustments as needed.  This is a 71 year old patient with some significant past medical history, risk factors including hypertension, hyperlipidemia, strong family history of ACS who presents with left-sided chest pain described as a pressure since this morning.  His chest pain is not resolved at this time.  He does not have any elevated troponin, however he does have some concerning story, heart score is elevated at 4.  We will consult with cardiology to discuss overnight monitoring, cardiac work-up.  Consultations Obtained: I requested consultation with the cardiologist given patient with somewhat concerning story for cardiac cause of chest pain, as well as heart score of 4.  After informing patient of the plan he does not request consultation with cardiology, does not wish to stay in the hospital for observation, does not wish for repeat troponin.  Discussed with patient that at this time he does not seem to have a cardiac cause of his chest pain, however he is high risk, encouraged him to stay.  After discussion he still would like to leave.  We discussed I recommend close follow-up with cardiology, and PCP.  Encouraged return to the emergency department if symptoms worsen or fail to improve.  As patient has had negative troponin with 6 hours from onset of chest pain, and chest pain is overall resolved at  this time other than mild twinges of pain, them sign out AMA to make sure that he understood that he should come back if he has any return of the symptoms.  I considered admission of this patient for monitoring, after this discussion patient declined continued discussion for admission, requests outpatient follow-up and discharge at this time.  He is discharged  in stable condition, strict return precautions are given.  Final Clinical Impression(s) / ED Diagnoses Final diagnoses:  Chest pain, unspecified type    Rx / DC Orders ED Discharge Orders     None         Dorien Chihuahua 11/19/21 1527    Valarie Merino, MD 11/20/21 1510

## 2021-11-19 NOTE — ED Triage Notes (Signed)
Per [facility] pt c/o chest pain onset of 5 am radiating into his lower back. Took 2 baby aspirin at home and 1 nitro with EMS with minor relief.

## 2021-11-19 NOTE — ED Provider Triage Note (Signed)
Emergency Medicine Provider Triage Evaluation Note  Richard Delacruz , a 71 y.o. male  was evaluated in triage.  Pt complains of chest pain.  Woke him up from sleep at 5 AM, its been constant for the last 78 hours.  It is left-sided, feels like pressure and radiates to his back.  No history of previous MIs, states he felt like this 30 years ago but there is no cause identified.  Given nitro by EMS, states it improved his pain for "maybe 5 minutes and then it came back".  Review of Systems  Positive: CHEST PAIN Negative: SYNCOPE  Physical Exam  BP (!) 151/88 (BP Location: Left Arm)    Pulse 65    Temp 99.3 F (37.4 C) (Oral)    Resp 16    Ht 5\' 9"  (1.753 m)    Wt 86.2 kg    SpO2 97%    BMI 28.06 kg/m  Gen:   Awake, no distress   Resp:  Normal effort  MSK:   Moves extremities without difficulty  Other:  S1-S2, radial pulse 2+ equal bilaterally  Medical Decision Making  Medically screening exam initiated at 12:29 PM.  Appropriate orders placed.  Burman Blacksmith B[place]ore was informed that the remainder of the evaluation will be completed by another provider, this initial triage assessment does not replace that evaluation, and the importance of remaining in the ED until their evaluation is complete.  Chest pain work-up   Sherrill Raring, PA-C 11/19/21 1229

## 2021-11-19 NOTE — Discharge Instructions (Addendum)
Please use Tylenol or ibuprofen for pain.  You may use 600 mg ibuprofen every 6 hours or 1000 mg of Tylenol every 6 hours.  You may choose to alternate between the 2.  This would be most effective.  Not to exceed 4 g of Tylenol within 24 hours.  Not to exceed 3200 mg ibuprofen 24 hours.  Due to some concerning characteristics of your chest pain I do recommend that you follow-up with a cardiologist at your earliest convenience for further investigation, potential stress testing, or catheterization at their discretion.  If you have returning symptoms of chest pain, shortness of breath I recommend that you return to the emergency department.

## 2021-11-19 NOTE — ED Notes (Signed)
Patient discharge instructions reviewed with the patient. The patient verbalized understanding of instructions. Patient discharged. 

## 2022-01-08 DIAGNOSIS — H2513 Age-related nuclear cataract, bilateral: Secondary | ICD-10-CM | POA: Diagnosis not present

## 2022-02-19 DIAGNOSIS — Z85820 Personal history of malignant melanoma of skin: Secondary | ICD-10-CM | POA: Diagnosis not present

## 2022-02-19 DIAGNOSIS — L7211 Pilar cyst: Secondary | ICD-10-CM | POA: Diagnosis not present

## 2022-02-19 DIAGNOSIS — Z08 Encounter for follow-up examination after completed treatment for malignant neoplasm: Secondary | ICD-10-CM | POA: Diagnosis not present

## 2022-02-19 DIAGNOSIS — Z1283 Encounter for screening for malignant neoplasm of skin: Secondary | ICD-10-CM | POA: Diagnosis not present

## 2022-04-25 DIAGNOSIS — Z1211 Encounter for screening for malignant neoplasm of colon: Secondary | ICD-10-CM | POA: Diagnosis not present

## 2022-04-25 DIAGNOSIS — Z1389 Encounter for screening for other disorder: Secondary | ICD-10-CM | POA: Diagnosis not present

## 2022-04-25 DIAGNOSIS — E782 Mixed hyperlipidemia: Secondary | ICD-10-CM | POA: Diagnosis not present

## 2022-04-25 DIAGNOSIS — Z0000 Encounter for general adult medical examination without abnormal findings: Secondary | ICD-10-CM | POA: Diagnosis not present

## 2022-05-12 DIAGNOSIS — D6869 Other thrombophilia: Secondary | ICD-10-CM | POA: Diagnosis not present

## 2022-05-12 DIAGNOSIS — Z1211 Encounter for screening for malignant neoplasm of colon: Secondary | ICD-10-CM | POA: Diagnosis not present

## 2022-05-12 DIAGNOSIS — Z6833 Body mass index (BMI) 33.0-33.9, adult: Secondary | ICD-10-CM | POA: Diagnosis not present

## 2022-05-12 DIAGNOSIS — I10 Essential (primary) hypertension: Secondary | ICD-10-CM | POA: Diagnosis not present

## 2022-05-12 DIAGNOSIS — I700 Atherosclerosis of aorta: Secondary | ICD-10-CM | POA: Diagnosis not present

## 2022-05-12 DIAGNOSIS — F319 Bipolar disorder, unspecified: Secondary | ICD-10-CM | POA: Diagnosis not present

## 2022-05-12 DIAGNOSIS — E7800 Pure hypercholesterolemia, unspecified: Secondary | ICD-10-CM | POA: Diagnosis not present

## 2022-06-05 DIAGNOSIS — H903 Sensorineural hearing loss, bilateral: Secondary | ICD-10-CM | POA: Diagnosis not present

## 2022-06-05 DIAGNOSIS — H6123 Impacted cerumen, bilateral: Secondary | ICD-10-CM | POA: Diagnosis not present

## 2022-11-17 DIAGNOSIS — F319 Bipolar disorder, unspecified: Secondary | ICD-10-CM | POA: Diagnosis not present

## 2022-11-17 DIAGNOSIS — Z6834 Body mass index (BMI) 34.0-34.9, adult: Secondary | ICD-10-CM | POA: Diagnosis not present

## 2022-11-17 DIAGNOSIS — Z23 Encounter for immunization: Secondary | ICD-10-CM | POA: Diagnosis not present

## 2022-11-17 DIAGNOSIS — Z86718 Personal history of other venous thrombosis and embolism: Secondary | ICD-10-CM | POA: Diagnosis not present

## 2022-11-17 DIAGNOSIS — I10 Essential (primary) hypertension: Secondary | ICD-10-CM | POA: Diagnosis not present

## 2022-11-17 DIAGNOSIS — C61 Malignant neoplasm of prostate: Secondary | ICD-10-CM | POA: Diagnosis not present

## 2022-11-17 DIAGNOSIS — E7800 Pure hypercholesterolemia, unspecified: Secondary | ICD-10-CM | POA: Diagnosis not present

## 2022-11-17 DIAGNOSIS — D6869 Other thrombophilia: Secondary | ICD-10-CM | POA: Diagnosis not present

## 2022-11-17 DIAGNOSIS — I700 Atherosclerosis of aorta: Secondary | ICD-10-CM | POA: Diagnosis not present

## 2022-12-01 ENCOUNTER — Other Ambulatory Visit (HOSPITAL_COMMUNITY): Payer: Self-pay | Admitting: Family Medicine

## 2022-12-01 DIAGNOSIS — C61 Malignant neoplasm of prostate: Secondary | ICD-10-CM

## 2022-12-02 ENCOUNTER — Other Ambulatory Visit (HOSPITAL_COMMUNITY): Payer: Self-pay | Admitting: Family Medicine

## 2022-12-02 DIAGNOSIS — R9721 Rising PSA following treatment for malignant neoplasm of prostate: Secondary | ICD-10-CM

## 2022-12-09 DIAGNOSIS — H6123 Impacted cerumen, bilateral: Secondary | ICD-10-CM | POA: Diagnosis not present

## 2022-12-18 ENCOUNTER — Ambulatory Visit (HOSPITAL_COMMUNITY)
Admission: RE | Admit: 2022-12-18 | Discharge: 2022-12-18 | Disposition: A | Discharge status: Home / Self Care | Payer: PPO | Source: Ambulatory Visit | Attending: Family Medicine | Admitting: Family Medicine

## 2022-12-18 DIAGNOSIS — C61 Malignant neoplasm of prostate: Secondary | ICD-10-CM | POA: Diagnosis not present

## 2022-12-18 DIAGNOSIS — R9721 Rising PSA following treatment for malignant neoplasm of prostate: Secondary | ICD-10-CM | POA: Diagnosis not present

## 2022-12-18 MED ORDER — PIFLIFOLASTAT F 18 (PYLARIFY) INJECTION
9.0000 | Freq: Once | INTRAVENOUS | Status: AC
  Administered 2022-12-18: 9.9 via INTRAVENOUS

## 2022-12-30 ENCOUNTER — Other Ambulatory Visit: Payer: Self-pay

## 2022-12-30 ENCOUNTER — Emergency Department (HOSPITAL_COMMUNITY): Payer: PPO

## 2022-12-30 ENCOUNTER — Emergency Department (HOSPITAL_COMMUNITY)
Admission: EM | Admit: 2022-12-30 | Discharge: 2022-12-30 | Disposition: A | Discharge status: Home / Self Care | Payer: PPO | Attending: Emergency Medicine | Admitting: Emergency Medicine

## 2022-12-30 DIAGNOSIS — H538 Other visual disturbances: Secondary | ICD-10-CM | POA: Diagnosis not present

## 2022-12-30 DIAGNOSIS — R519 Headache, unspecified: Secondary | ICD-10-CM | POA: Diagnosis not present

## 2022-12-30 DIAGNOSIS — W01198A Fall on same level from slipping, tripping and stumbling with subsequent striking against other object, initial encounter: Secondary | ICD-10-CM | POA: Insufficient documentation

## 2022-12-30 DIAGNOSIS — M542 Cervicalgia: Secondary | ICD-10-CM | POA: Diagnosis not present

## 2022-12-30 DIAGNOSIS — S3993XA Unspecified injury of pelvis, initial encounter: Secondary | ICD-10-CM | POA: Diagnosis not present

## 2022-12-30 DIAGNOSIS — S060X0A Concussion without loss of consciousness, initial encounter: Secondary | ICD-10-CM | POA: Diagnosis not present

## 2022-12-30 DIAGNOSIS — M4807 Spinal stenosis, lumbosacral region: Secondary | ICD-10-CM | POA: Diagnosis not present

## 2022-12-30 DIAGNOSIS — W19XXXA Unspecified fall, initial encounter: Secondary | ICD-10-CM

## 2022-12-30 LAB — BASIC METABOLIC PANEL
Anion gap: 14 (ref 5–15)
BUN: 16 mg/dL (ref 8–23)
CO2: 24 mmol/L (ref 22–32)
Calcium: 9.6 mg/dL (ref 8.9–10.3)
Chloride: 103 mmol/L (ref 98–111)
Creatinine, Ser: 1.17 mg/dL (ref 0.61–1.24)
GFR, Estimated: >60 mL/min (ref >60)
Glucose, Bld: 90 mg/dL (ref 70–99)
Potassium: 4.4 mmol/L (ref 3.5–5.1)
Sodium: 141 mmol/L (ref 135–145)

## 2022-12-30 LAB — CBC WITH DIFFERENTIAL/PLATELET
Abs Immature Granulocytes: 0.03 10*3/uL (ref 0.00–0.07)
Basophils Absolute: 0.1 10*3/uL (ref 0.0–0.1)
Basophils Relative: 1 %
Eosinophils Absolute: 0.1 10*3/uL (ref 0.0–0.5)
Eosinophils Relative: 2 %
HCT: 49.8 % (ref 39.0–52.0)
Hemoglobin: 16.1 g/dL (ref 13.0–17.0)
Immature Granulocytes: 0 %
Lymphocytes Relative: 30 %
Lymphs Abs: 2.1 10*3/uL (ref 0.7–4.0)
MCH: 31.3 pg (ref 26.0–34.0)
MCHC: 32.3 g/dL (ref 30.0–36.0)
MCV: 96.9 fL (ref 80.0–100.0)
Monocytes Absolute: 0.6 10*3/uL (ref 0.1–1.0)
Monocytes Relative: 9 %
Neutro Abs: 3.9 10*3/uL (ref 1.7–7.7)
Neutrophils Relative %: 58 %
Platelets: 203 10*3/uL (ref 150–400)
RBC: 5.14 MIL/uL (ref 4.22–5.81)
RDW: 12.3 % (ref 11.5–15.5)
WBC: 6.8 10*3/uL (ref 4.0–10.5)
nRBC: 0 % (ref 0.0–0.2)

## 2022-12-30 MED ORDER — MORPHINE SULFATE (PF) 2 MG/ML IV SOLN
2.0000 mg | Freq: Once | INTRAVENOUS | Status: AC
  Administered 2022-12-30: 2 mg via INTRAVENOUS
  Filled 2022-12-30: qty 1

## 2022-12-30 MED ORDER — METOCLOPRAMIDE HCL 5 MG/ML IJ SOLN
5.0000 mg | Freq: Once | INTRAMUSCULAR | Status: AC
  Administered 2022-12-30: 5 mg via INTRAVENOUS
  Filled 2022-12-30: qty 2

## 2022-12-30 MED ORDER — DIPHENHYDRAMINE HCL 50 MG/ML IJ SOLN
12.5000 mg | Freq: Once | INTRAMUSCULAR | Status: AC
  Administered 2022-12-30: 12.5 mg via INTRAVENOUS
  Filled 2022-12-30: qty 1

## 2022-12-30 MED ORDER — DICLOFENAC SODIUM 1 % EX GEL: 4.0000 g | Freq: Four times a day (QID) | CUTANEOUS | 0 refills | Status: DC

## 2022-12-30 MED ORDER — SODIUM CHLORIDE 0.9 % IV BOLUS
1000.0000 mL | Freq: Once | INTRAVENOUS | Status: AC
  Administered 2022-12-30: 1000 mL via INTRAVENOUS

## 2022-12-30 NOTE — Discharge Instructions (Signed)
Please call your family doctor tomorrow and let them know about your visit here today.  He will hurt worse tomorrow that is normal.  Please return for worsening headache confusion or persistent vomiting.  Use the gel as prescribed Also take tylenol 1000mg (2 extra strength) four times a day.

## 2022-12-30 NOTE — Progress Notes (Signed)
Orthopedic Tech Progress Note Patient Details:  Richard Delacruz 05-Mar-1951 [id number]  Level 2 trauma   Patient ID: [name], male   DOB: 1951-03-07, 72 y.o.   MRN: [id number]  Janit Pagan 12/30/2022, 3:52 PM

## 2022-12-30 NOTE — ED Triage Notes (Signed)
TO ED via POV from home with c/o fall at 7a -- slipped on wet floor- hit low back on windowsill and hit head also. Since then he has had blurry vision, headache.   Dr. Tyrone Nine at bedside on this TRN's arrival to room

## 2022-12-30 NOTE — ED Provider Notes (Signed)
[facility] Provider Note   CSN: [id number] Arrival date & time: 12/30/22  1508     History  Chief Complaint  Patient presents with   Fall on thinners   Level 2 trauma    Richard Delacruz is a 72 y.o. male.  72 yo M with a chief complaint of a fall.  The patient states that he was walking on a wet floor and he slipped and landed on his bottom and then fell back and struck the back of his head against a windowsill.  He almost passed out but does not think that he did.  Since then he has been having some trouble with his vision headaches and low back pain.  He fell about 8 hours ago.  Denies one-sided numbness or weakness denies difficulty speech or swallowing.  He denies any neck pain chest pain abdominal pain.        Home Medications Prior to Admission medications   Medication Sig Start Date End Date Taking? Authorizing Provider  diclofenac Sodium (VOLTAREN) 1 % GEL Apply 4 g topically 4 (four) times daily. 12/30/22  Yes Deno Etienne, DO  buPROPion (WELLBUTRIN SR) 150 MG 12 hr tablet Take 150 mg by mouth 2 (two) times daily. 04/25/14   [provider]  Calcium Carbonate-Vitamin D (CALCIUM + D PO) Take 1 tablet by mouth daily.    [provider]  clonazePAM (KLONOPIN) 1 MG tablet Take 1 mg by mouth 2 (two) times daily.  05/26/14   [provider]  Flaxseed, Linseed, (FLAX SEED OIL PO) Take 1 tablet by mouth daily.     [provider]  lamoTRIgine (LAMICTAL) 100 MG tablet Take 100 mg by mouth 2 (two) times daily. 03/06/14   [provider]  leuprolide (LUPRON) 22.5 MG injection Inject 22.5 mg into the muscle every 3 (three) months.    [provider]  lovastatin (MEVACOR) 40 MG tablet Take 40 mg by mouth at bedtime.  05/05/14   [provider]  metoprolol succinate (TOPROL-XL) 25 MG 24 hr tablet Take 25 mg by mouth daily. 03/06/14   [provider]  Omega-3 Fatty Acids (OMEGA  3 PO) Take 1 tablet by mouth daily.     [provider]  pantoprazole (PROTONIX) 40 MG tablet Take 40 mg by mouth daily.    [provider]  Rivaroxaban (XARELTO) 15 MG TABS tablet Take 1 tablet (15 mg total) by mouth 2 (two) times daily. 06/08/14   Daleen Bo, MD      Allergies    Atacand [candesartan], Amlodipine, Monopril [fosinopril], Naproxen, and Niaspan [niacin er]    Review of Systems   Review of Systems  Physical Exam Updated Vital Signs BP 126/75   Pulse (!) 53   Temp (S) (!) 97.2 F (36.2 C) (Temporal)   Resp 11   Ht 5\' 9"  (1.753 m)   Wt 90.7 kg   SpO2 99%   BMI 29.53 kg/m  Physical Exam Vitals and nursing note reviewed.  Constitutional:      Appearance: He is well-developed.  HENT:     Head: Normocephalic and atraumatic.  Eyes:     Pupils: Pupils are equal, round, and reactive to light.  Neck:     Vascular: No JVD.  Cardiovascular:     Rate and Rhythm: Normal rate and regular rhythm.     Heart sounds: No murmur heard.    No friction rub. No gallop.  Pulmonary:  Effort: No respiratory distress.     Breath sounds: No wheezing.  Abdominal:     General: There is no distension.     Tenderness: There is no abdominal tenderness. There is no guarding or rebound.  Musculoskeletal:        General: Normal range of motion.     Cervical back: Normal range of motion and neck supple.     Comments: No midline spinal tenderness step-offs or deformities.  He has some mild neck pain with rotation of his head but no obvious midline pain.  Palpated from head to toe without any other noted areas of bony tenderness.  Skin:    Coloration: Skin is not pale.     Findings: No rash.  Neurological:     Mental Status: He is alert and oriented to person, place, and time.  Psychiatric:        Behavior: Behavior normal.     ED Results / Procedures / Treatments   Labs (all labs ordered are listed, but only abnormal results are displayed) Labs Reviewed   CBC WITH DIFFERENTIAL/PLATELET  BASIC METABOLIC PANEL    EKG None  Radiology CT Head Wo Contrast  Result Date: 12/30/2022 CLINICAL DATA:  Head trauma. Slipped on wet floor. Hit head. Complains of blurry vision and headache. EXAM: CT HEAD WITHOUT CONTRAST CT CERVICAL SPINE WITHOUT CONTRAST TECHNIQUE: Multidetector CT imaging of the head and cervical spine was performed following the standard protocol without intravenous contrast. Multiplanar CT image reconstructions of the cervical spine were also generated. RADIATION DOSE REDUCTION: This exam was performed according to the departmental dose-optimization program which includes automated exposure control, adjustment of the mA and/or kV according to patient size and/or use of iterative reconstruction technique. COMPARISON:  None Available. FINDINGS: CT HEAD FINDINGS Brain: No evidence of acute infarction, hemorrhage, hydrocephalus, extra-axial collection or mass lesion/mass effect. Prominence of the sulci and ventricles compatible with brain atrophy. Vascular: No hyperdense vessel or unexpected calcification. Skull: Normal. Negative for fracture or focal lesion. Sinuses/Orbits: Mild mucosal thickening is identified involving bilateral maxillary sinuses. Polyp or retention cyst noted in the sphenoid sinus.Mastoid air cells are clear. Other: No signs of scalp hematoma. CT CERVICAL SPINE FINDINGS Alignment: Normal. Skull base and vertebrae: No acute fracture. No primary bone lesion or focal pathologic process. Soft tissues and spinal canal: No prevertebral fluid or swelling. No visible canal hematoma. Disc levels: There is disc space narrowing at the to C5-6 level. Partial fusion of the C6-7 disc space noted. Large ventral osteophytes are noted extending from 2 C4 through C7. Upper chest: Within the anterior apex there is a spiculated lung nodule measuring 1.1 cm, image 101/5. Unchanged PET-CT dated from 12/18/2022. Within the lateral right apex there is a  nodule measuring 7 mm, image 93/5. Unchanged from recent PET-CT. Other: None IMPRESSION: 1. No acute intracranial abnormality. 2. Mild brain atrophy. 3. No evidence for cervical spine fracture or subluxation. 4. Cervical degenerative disc disease. 5. Again seen are nodules within the apical segments of both upper lobes. See report from recent PET-CT dated 12/18/2022. Electronically Signed   By: Kerby Moors M.D.   On: 12/30/2022 16:24   CT Cervical Spine Wo Contrast  Result Date: 12/30/2022 CLINICAL DATA:  Head trauma. Slipped on wet floor. Hit head. Complains of blurry vision and headache. EXAM: CT HEAD WITHOUT CONTRAST CT CERVICAL SPINE WITHOUT CONTRAST TECHNIQUE: Multidetector CT imaging of the head and cervical spine was performed following the standard protocol without intravenous contrast. Multiplanar CT image  reconstructions of the cervical spine were also generated. RADIATION DOSE REDUCTION: This exam was performed according to the departmental dose-optimization program which includes automated exposure control, adjustment of the mA and/or kV according to patient size and/or use of iterative reconstruction technique. COMPARISON:  None Available. FINDINGS: CT HEAD FINDINGS Brain: No evidence of acute infarction, hemorrhage, hydrocephalus, extra-axial collection or mass lesion/mass effect. Prominence of the sulci and ventricles compatible with brain atrophy. Vascular: No hyperdense vessel or unexpected calcification. Skull: Normal. Negative for fracture or focal lesion. Sinuses/Orbits: Mild mucosal thickening is identified involving bilateral maxillary sinuses. Polyp or retention cyst noted in the sphenoid sinus.Mastoid air cells are clear. Other: No signs of scalp hematoma. CT CERVICAL SPINE FINDINGS Alignment: Normal. Skull base and vertebrae: No acute fracture. No primary bone lesion or focal pathologic process. Soft tissues and spinal canal: No prevertebral fluid or swelling. No visible canal  hematoma. Disc levels: There is disc space narrowing at the to C5-6 level. Partial fusion of the C6-7 disc space noted. Large ventral osteophytes are noted extending from 2 C4 through C7. Upper chest: Within the anterior apex there is a spiculated lung nodule measuring 1.1 cm, image 101/5. Unchanged PET-CT dated from 12/18/2022. Within the lateral right apex there is a nodule measuring 7 mm, image 93/5. Unchanged from recent PET-CT. Other: None IMPRESSION: 1. No acute intracranial abnormality. 2. Mild brain atrophy. 3. No evidence for cervical spine fracture or subluxation. 4. Cervical degenerative disc disease. 5. Again seen are nodules within the apical segments of both upper lobes. See report from recent PET-CT dated 12/18/2022. Electronically Signed   By: Kerby Moors M.D.   On: 12/30/2022 16:24   CT Lumbar Spine Wo Contrast  Result Date: 12/30/2022 CLINICAL DATA:  Trauma EXAM: CT LUMBAR SPINE WITHOUT CONTRAST TECHNIQUE: Multidetector CT imaging of the lumbar spine was performed without intravenous contrast administration. Multiplanar CT image reconstructions were also generated. RADIATION DOSE REDUCTION: This exam was performed according to the departmental dose-optimization program which includes automated exposure control, adjustment of the mA and/or kV according to patient size and/or use of iterative reconstruction technique. COMPARISON:  PET-CT 12/18/2022. FINDINGS: Segmentation: There is transitional anatomy with partial lumbarization of the S1 vertebral body and a rudimentary S1-S2 disc space. For the purposes of this report, the first non-rib-bearing lumbar type vertebral body is designated L1 in the lowest fully formed disc space is designated L5-S1. Alignment: There is exaggerated lumbar lordosis. There is no antero or retrolisthesis. There is no jumped or perched facet or other evidence of traumatic malalignment. Vertebrae: Vertebral body heights are preserved. There is no evidence of acute  fracture. There is no suspicious osseous lesion a bony excrescence arising from the left iliac bone without aggressive features is unchanged. Paraspinal and other soft tissues: The paraspinal soft tissues are unremarkable. A nonobstructing left lower pole renal stone measuring 6 mm is noted. Left renal cysts are noted requiring no specific imaging follow-up. Disc levels: There is trace air within the disc space at L5-S1. The disc spaces are otherwise normal in appearance. There is advanced facet arthropathy at L5-S1 and moderate facet arthropathy at L3-L4 and L4-L5. There are mild disc bulges at L2-L3 through L5-S1. Findings result in up to moderate spinal canal stenosis and moderate left worse than right neural foraminal stenosis at L5-S1. IMPRESSION: 1. No acute fracture or traumatic malalignment of the lumbar spine. 2. Degenerative changes as above, most advanced at L5-S1. 3. Nonobstructing left lower pole renal stone. Electronically Signed   By:  Valetta Mole M.D.   On: 12/30/2022 16:21   DG Pelvis Portable  Result Date: 12/30/2022 CLINICAL DATA:  Trauma, fall EXAM: PORTABLE PELVIS 1-2 VIEWS COMPARISON:  None Available. FINDINGS: No displaced fracture or dislocation is seen. Degenerative changes are noted with small bony spurs and mild joint space narrowing in both hips, more so on the left side. SI joints are symmetrical. Degenerative changes are noted in lower lumbar spine. IMPRESSION: No recent displaced fracture or dislocation is seen. Electronically Signed   By: Elmer Picker M.D.   On: 12/30/2022 15:33    Procedures Procedures    Medications Ordered in ED Medications  metoCLOPramide (REGLAN) injection 5 mg (5 mg Intravenous Given 12/30/22 1536)  diphenhydrAMINE (BENADRYL) injection 12.5 mg (12.5 mg Intravenous Given 12/30/22 1535)  sodium chloride 0.9 % bolus 1,000 mL (0 mLs Intravenous Stopped 12/30/22 1638)  morphine (PF) 2 MG/ML injection 2 mg (2 mg Intravenous Given 12/30/22 1537)     ED Course/ Medical Decision Making/ A&P                             Medical Decision Making Amount and/or Complexity of Data Reviewed Labs: ordered. Radiology: ordered.  Risk Prescription drug management.   72 yo M with a chief complaint of a fall.  Nonsyncopal by history.  Patient complaining mostly of low back pain and headache and change to his vision.  He describes the vision changes like he does not have his glasses on.  He denies any eye pain.  Feels like it is both of his eyes.  He does take Xarelto for a DVT that he had approximately 5 years ago.  Will obtain a CT scan of the head and C-spine and L-spine.  Plain film of the pelvis.  Blood work.  Headache cocktail reassess.  Plain film of the pelvis independently interpreted by me without fracture.  CT of the head independently interpreted by me without intracranial hemorrhage.  CT of the C-spine without obvious acute fracture.  CT of the L-spine without obvious fracture.  Blood work was obtained no anemia, no significant electrolyte abnormality.  Patient is feeling better on repeat assessment.  Will discharge home.  PCP follow-up.  4:48 PM:  I have discussed the diagnosis/risks/treatment options with the patient and family.  Evaluation and diagnostic testing in the emergency department does not suggest an emergent condition requiring admission or immediate intervention beyond what has been performed at this time.  They will follow up with PCP. We also discussed returning to the ED immediately if new or worsening sx occur. We discussed the sx which are most concerning (e.g., sudden worsening pain, fever, inability to tolerate by mouth) that necessitate immediate return. Medications administered to the patient during their visit and any new prescriptions provided to the patient are listed below.  Medications given during this visit Medications  metoCLOPramide (REGLAN) injection 5 mg (5 mg Intravenous Given 12/30/22 1536)   diphenhydrAMINE (BENADRYL) injection 12.5 mg (12.5 mg Intravenous Given 12/30/22 1535)  sodium chloride 0.9 % bolus 1,000 mL (0 mLs Intravenous Stopped 12/30/22 1638)  morphine (PF) 2 MG/ML injection 2 mg (2 mg Intravenous Given 12/30/22 1537)     The patient appears reasonably screen and/or stabilized for discharge and I doubt any other medical condition or other [facility] requiring further screening, evaluation, or treatment in the ED at this time prior to discharge.          Final Clinical  Impression(s) / ED Diagnoses Final diagnoses:  Fall, initial encounter  Concussion without loss of consciousness, initial encounter    Rx / DC Orders ED Discharge Orders          Ordered    diclofenac Sodium (VOLTAREN) 1 % GEL  4 times daily        12/30/22 [place], Chandni Gagan, DO 12/30/22 1648

## 2023-01-14 DIAGNOSIS — H47233 Glaucomatous optic atrophy, bilateral: Secondary | ICD-10-CM | POA: Diagnosis not present

## 2023-01-14 DIAGNOSIS — H43813 Vitreous degeneration, bilateral: Secondary | ICD-10-CM | POA: Diagnosis not present

## 2023-01-14 DIAGNOSIS — H2513 Age-related nuclear cataract, bilateral: Secondary | ICD-10-CM | POA: Diagnosis not present

## 2023-01-20 ENCOUNTER — Encounter: Payer: Self-pay | Admitting: Emergency Medicine

## 2023-01-20 ENCOUNTER — Ambulatory Visit (INDEPENDENT_AMBULATORY_CARE_PROVIDER_SITE_OTHER): Payer: PPO | Admitting: Emergency Medicine

## 2023-01-20 VITALS — BP 160/90 | HR 64 | Temp 97.6°F | Ht 69.0 in | Wt 234.0 lb

## 2023-01-20 DIAGNOSIS — R918 Other nonspecific abnormal finding of lung field: Secondary | ICD-10-CM | POA: Insufficient documentation

## 2023-01-20 NOTE — H&P (View-Only) (Signed)
 Subjective:    Patient ID: Richard Delacruz, male    DOB: 02/04/1951, 72 y.o.   MRN: [id number]  HPI 72-year-old man never smoker with a history of prostate cancer, melanoma of R arm, hypertension, polyp of the colon, GERD, history of DVT, hyperlipidemia, CKD stage III, migraines, bipolar disorder.  He has a history of pulmonary nodular disease going back to imaging February 72 2019.  These had been deemed stable in size and appearance.  He has a rising PSA and this prompted a repeat PET scan done on 12/18/2022.  He is referred today to discuss the PSMA-PET scan findings.   PET scan performed 12/18/2022 and reviewed by me shows a nontracer avid 7 mm right upper lobe nodule at the pleural surface unchanged compared with the prior PET 04/22/2021.  There is a partially calcified 13 mm lingular nodule that is enlarged compared with 09/2019.  New enlarging pulmonary nodules in the left upper lobe 12 mm, left upper lobe 9 mm, left lower lobe 12 mm compare with priors.  These were not PSMA hypermetabolic, inconsistent with metastatic prostate cancer   Review of Systems As per HPI  Past Medical History:  Diagnosis Date   Cancer    Hypertension      No family history on file.   Social History   Socioeconomic History   Marital status: Married    Spouse name: Not on file   Number of children: Not on file   Years of education: Not on file   Highest education level: Not on file  Occupational History   Not on file  Tobacco Use   Smoking status: Never   Smokeless tobacco: Never  Substance and Sexual Activity   Alcohol use: No   Drug use: No   Sexual activity: Not on file  Other Topics Concern   Not on file  Social History Narrative   Not on file   Social Determinants of Health   Financial Resource Strain: Not on file  Food Insecurity: Not on file  Transportation Needs: Not on file  Physical Activity: Not on file  Stress: Not on file  Social Connections: Not on file  Intimate  Partner Violence: Not on file    Second hand smoke exposure Some asbestos exposure 50 yrs ago Has lived in [place], [place] Has been a teacher.  Pesticide exposure    Allergies  Allergen Reactions   Atacand [Candesartan] Anaphylaxis   Amlodipine     Swelling, joint and muscle pain    Monopril [Fosinopril] Other (See Comments)    cough   Naproxen Other (See Comments)    Abdominal pain    Niaspan [Niacin Er] Rash     Outpatient Medications Prior to Visit  Medication Sig Dispense Refill   buPROPion (WELLBUTRIN SR) 150 MG 12 hr tablet Take 150 mg by mouth 2 (two) times daily.     clonazePAM (KLONOPIN) 1 MG tablet Take 1 mg by mouth 2 (two) times daily.      lamoTRIgine (LAMICTAL) 100 MG tablet Take 100 mg by mouth 2 (two) times daily.     metoprolol succinate (TOPROL-XL) 25 MG 24 hr tablet Take 25 mg by mouth daily.     Rivaroxaban (XARELTO) 15 MG TABS tablet Take 1 tablet (15 mg total) by mouth 2 (two) times daily. 42 tablet 0   Calcium Carbonate-Vitamin D (CALCIUM + D PO) Take 1 tablet by mouth daily.     diclofenac Sodium (VOLTAREN) 1 % GEL Apply 4 g topically 4 (  four) times daily. 100 g 0   Flaxseed, Linseed, (FLAX SEED OIL PO) Take 1 tablet by mouth daily.      leuprolide (LUPRON) 22.5 MG injection Inject 22.5 mg into the muscle every 3 (three) months.     lovastatin (MEVACOR) 40 MG tablet Take 40 mg by mouth at bedtime.      Omega-3 Fatty Acids (OMEGA 3 PO) Take 1 tablet by mouth daily.      pantoprazole (PROTONIX) 40 MG tablet Take 40 mg by mouth daily.     No facility-administered medications prior to visit.         Objective:   Physical Exam  Vitals:   01/20/23 1438  BP: (!) 160/90  Pulse: 64  Temp: 97.6 F (36.4 C)  TempSrc: Oral  SpO2: 98%  Weight: 234 lb (106.1 kg)  Height: 5' 9" (1.753 m)   Gen: Pleasant, well-nourished, in no distress,  normal affect  ENT: No lesions,  mouth clear,  oropharynx clear, no postnasal drip  Neck: No JVD, no stridor  Lungs: No  use of accessory muscles, no crackles or wheezing on normal respiration, no wheeze on forced expiration  Cardiovascular: RRR, heart sounds normal, no murmur or gallops, no peripheral edema  Musculoskeletal: No deformities, no cyanosis or clubbing  Neuro: alert, awake, non focal  Skin: Warm, no lesions or rash     Assessment & Plan:  Pulmonary nodules Bilateral pulmonary nodules noted on PET scan 12/2022 prompted by a rising PSA.  He has a history of prostate cancer and melanoma.  The nodules have increased in size compared with the prior PET 04/2021.  They are not PSMA-PET positive, inconsistent with prostate cancer.  We talked about the differential diagnosis.  Consider getting a standard PET scan to better characterize but I think he needs a tissue diagnosis.  We will arrange for robotic assisted navigational bronchoscopy.  Discussed the pros, cons, risks and benefits.  He agrees with proceeding.  He will need a super D CT prior.  Will hold his Xarelto 2 days prior.  We reviewed your PET scans today. There are several slowly enlarging pulmonary nodules present. We will plan to perform robotic assisted navigational bronchoscopy.  This will be done as an outpatient under general anesthesia at [facility] end[facility].  You will need a designated driver and someone to watch you at home after the procedure.  You will need to stop your Xarelto 2 days prior.  Will try to get this scheduled for 01/26/2023.  Alternatively we may be able to schedule for 02/02/2023. You will need a CT scan of the chest done prior to the procedure Follow Dr. Mattison Stuckey in 1 month or next available   Dexton Zwilling, MD, PhD 01/20/2023, 3:33 PM [facility] [phone] or if no answer before 7:00PM call [phone] For any issues after 7:00PM please call eLink [phone]   

## 2023-01-20 NOTE — Patient Instructions (Signed)
We reviewed your PET scans today. There are several slowly enlarging pulmonary nodules present. We will plan to perform robotic assisted navigational bronchoscopy.  This will be done as an outpatient under general anesthesia at [facility] endoscopy.  You will need a designated driver and someone to watch you at home after the procedure.  You will need to stop your Xarelto 2 days prior.  Will try to get this scheduled for 01/26/2023.  Alternatively we may be able to schedule for 02/02/2023. You will need a CT scan of the chest done prior to the procedure Follow Dr. Delton Coombes in 1 month or next available

## 2023-01-20 NOTE — Assessment & Plan Note (Signed)
Bilateral pulmonary nodules noted on PET scan 12/2022 prompted by a rising PSA.  He has a history of prostate cancer and melanoma.  The nodules have increased in size compared with the prior PET 04/2021.  They are not PSMA-PET positive, inconsistent with prostate cancer.  We talked about the differential diagnosis.  Consider getting a standard PET scan to better characterize but I think he needs a tissue diagnosis.  We will arrange for robotic assisted navigational bronchoscopy.  Discussed the pros, cons, risks and benefits.  He agrees with proceeding.  He will need a super D CT prior.  Will hold his Xarelto 2 days prior.  We reviewed your PET scans today. There are several slowly enlarging pulmonary nodules present. We will plan to perform robotic assisted navigational bronchoscopy.  This will be done as an outpatient under general anesthesia at [facility] endoscopy.  You will need a designated driver and someone to watch you at home after the procedure.  You will need to stop your Xarelto 2 days prior.  Will try to get this scheduled for 01/26/2023.  Alternatively we may be able to schedule for 02/02/2023. You will need a CT scan of the chest done prior to the procedure Follow Dr. Delton Coombes in 1 month or next available

## 2023-01-20 NOTE — Progress Notes (Signed)
Subjective:    Patient ID: Richard Delacruz, male    DOB: [birth year], 72 y.o.   MRN: [id number]  HPI 72 year old man never smoker with a history of prostate cancer, melanoma of R arm, hypertension, polyp of the colon, GERD, history of DVT, hyperlipidemia, CKD stage III, migraines, bipolar disorder.  He has a history of pulmonary nodular disease going back to imaging February 2019.  These had been deemed stable in size and appearance.  He has a rising PSA and this prompted a repeat PET scan done on 12/18/2022.  He is referred today to discuss the PSMA-PET scan findings.   PET scan performed 12/18/2022 and reviewed by me shows a nontracer avid 7 mm right upper lobe nodule at the pleural surface unchanged compared with the prior PET 04/22/2021.  There is a partially calcified 13 mm lingular nodule that is enlarged compared with 09/2019.  New enlarging pulmonary nodules in the left upper lobe 12 mm, left upper lobe 9 mm, left lower lobe 12 mm compare with priors.  These were not PSMA hypermetabolic, inconsistent with metastatic prostate cancer   Review of Systems As per HPI  Past Medical History:  Diagnosis Date   Cancer    Hypertension      No family history on file.   Social History   Socioeconomic History   Marital status: Married    Spouse name: Not on file   Number of children: Not on file   Years of education: Not on file   Highest education level: Not on file  Occupational History   Not on file  Tobacco Use   Smoking status: Never   Smokeless tobacco: Never  Substance and Sexual Activity   Alcohol use: No   Drug use: No   Sexual activity: Not on file  Other Topics Concern   Not on file  Social History Narrative   Not on file   Social Determinants of Health   Financial Resource Strain: Not on file  Food Insecurity: Not on file  Transportation Needs: Not on file  Physical Activity: Not on file  Stress: Not on file  Social Connections: Not on file  Intimate  Partner Violence: Not on file    Second hand smoke exposure Some asbestos exposure 50 yrs ago Has lived in [place], [place] Has been a Runner, broadcasting/film/video.  Pesticide exposure    Allergies  Allergen Reactions   Atacand [Candesartan] Anaphylaxis   Amlodipine     Swelling, joint and muscle pain    Monopril [Fosinopril] Other (See Comments)    cough   Naproxen Other (See Comments)    Abdominal pain    Niaspan [Niacin Er] Rash     Outpatient Medications Prior to Visit  Medication Sig Dispense Refill   buPROPion (WELLBUTRIN SR) 150 MG 12 hr tablet Take 150 mg by mouth 2 (two) times daily.     clonazePAM (KLONOPIN) 1 MG tablet Take 1 mg by mouth 2 (two) times daily.      lamoTRIgine (LAMICTAL) 100 MG tablet Take 100 mg by mouth 2 (two) times daily.     metoprolol succinate (TOPROL-XL) 25 MG 24 hr tablet Take 25 mg by mouth daily.     Rivaroxaban (XARELTO) 15 MG TABS tablet Take 1 tablet (15 mg total) by mouth 2 (two) times daily. 42 tablet 0   Calcium Carbonate-Vitamin D (CALCIUM + D PO) Take 1 tablet by mouth daily.     diclofenac Sodium (VOLTAREN) 1 % GEL Apply 4 g topically 4 (  four) times daily. 100 g 0   Flaxseed, Linseed, (FLAX SEED OIL PO) Take 1 tablet by mouth daily.      leuprolide (LUPRON) 22.5 MG injection Inject 22.5 mg into the muscle every 3 (three) months.     lovastatin (MEVACOR) 40 MG tablet Take 40 mg by mouth at bedtime.      Omega-3 Fatty Acids (OMEGA 3 PO) Take 1 tablet by mouth daily.      pantoprazole (PROTONIX) 40 MG tablet Take 40 mg by mouth daily.     No facility-administered medications prior to visit.         Objective:   Physical Exam  Vitals:   01/20/23 1438  BP: (!) 160/90  Pulse: 64  Temp: 97.6 F (36.4 C)  TempSrc: Oral  SpO2: 98%  Weight: 234 lb (106.1 kg)  Height: 5\' 9"  (1.753 m)   Gen: Pleasant, well-nourished, in no distress,  normal affect  ENT: No lesions,  mouth clear,  oropharynx clear, no postnasal drip  Neck: No JVD, no stridor  Lungs: No  use of accessory muscles, no crackles or wheezing on normal respiration, no wheeze on forced expiration  Cardiovascular: RRR, heart sounds normal, no murmur or gallops, no peripheral edema  Musculoskeletal: No deformities, no cyanosis or clubbing  Neuro: alert, awake, non focal  Skin: Warm, no lesions or rash     Assessment & Plan:  Pulmonary nodules Bilateral pulmonary nodules noted on PET scan 12/2022 prompted by a rising PSA.  He has a history of prostate cancer and melanoma.  The nodules have increased in size compared with the prior PET 04/2021.  They are not PSMA-PET positive, inconsistent with prostate cancer.  We talked about the differential diagnosis.  Consider getting a standard PET scan to better characterize but I think he needs a tissue diagnosis.  We will arrange for robotic assisted navigational bronchoscopy.  Discussed the pros, cons, risks and benefits.  He agrees with proceeding.  He will need a super D CT prior.  Will hold his Xarelto 2 days prior.  We reviewed your PET scans today. There are several slowly enlarging pulmonary nodules present. We will plan to perform robotic assisted navigational bronchoscopy.  This will be done as an outpatient under general anesthesia at [facility] endoscopy.  You will need a designated driver and someone to watch you at home after the procedure.  You will need to stop your Xarelto 2 days prior.  Will try to get this scheduled for 01/26/2023.  Alternatively we may be able to schedule for 02/02/2023. You will need a CT scan of the chest done prior to the procedure Follow Dr. Delton Coombes in 1 month or next available   Levy Pupa, MD, PhD 01/20/2023, 3:33 PM [facility] [phone] or if no answer before 7:00PM call [phone] For any issues after 7:00PM please call eLink [phone]

## 2023-01-22 ENCOUNTER — Other Ambulatory Visit: Payer: PPO

## 2023-01-22 DIAGNOSIS — R918 Other nonspecific abnormal finding of lung field: Secondary | ICD-10-CM | POA: Diagnosis not present

## 2023-01-23 ENCOUNTER — Encounter (HOSPITAL_COMMUNITY): Payer: Self-pay | Admitting: Emergency Medicine

## 2023-01-23 ENCOUNTER — Other Ambulatory Visit: Payer: Self-pay

## 2023-01-23 ENCOUNTER — Ambulatory Visit (HOSPITAL_BASED_OUTPATIENT_CLINIC_OR_DEPARTMENT_OTHER)
Admission: RE | Admit: 2023-01-23 | Discharge: 2023-01-23 | Disposition: A | Discharge status: Home / Self Care | Payer: PPO | Source: Ambulatory Visit | Attending: Emergency Medicine | Admitting: Emergency Medicine

## 2023-01-23 DIAGNOSIS — R918 Other nonspecific abnormal finding of lung field: Secondary | ICD-10-CM

## 2023-01-23 LAB — NOVEL CORONAVIRUS, NAA: SARS-CoV-2, NAA: NOT DETECTED

## 2023-01-23 NOTE — Progress Notes (Signed)
Spoke with pt for pre-op call. Pt denies cardiac history. Pt is treated for HTN and hx of DVT with Xarelto. Pt was instructed to hold the Xarelto two days prior to surgery. Pt's last dose is today, 01/23/23.   Covid test done 01/22/23.  Shower instructions given to pt.

## 2023-01-26 ENCOUNTER — Ambulatory Visit (HOSPITAL_BASED_OUTPATIENT_CLINIC_OR_DEPARTMENT_OTHER): Payer: PPO | Admitting: Anesthesiology

## 2023-01-26 ENCOUNTER — Ambulatory Visit (HOSPITAL_COMMUNITY): Payer: PPO

## 2023-01-26 ENCOUNTER — Ambulatory Visit (HOSPITAL_COMMUNITY): Payer: PPO | Admitting: Anesthesiology

## 2023-01-26 ENCOUNTER — Encounter (HOSPITAL_COMMUNITY): Payer: Self-pay | Admitting: Emergency Medicine

## 2023-01-26 ENCOUNTER — Other Ambulatory Visit: Payer: Self-pay

## 2023-01-26 ENCOUNTER — Observation Stay (HOSPITAL_COMMUNITY): Payer: PPO

## 2023-01-26 ENCOUNTER — Observation Stay (HOSPITAL_COMMUNITY)
Admission: RE | Admit: 2023-01-26 | Discharge: 2023-01-27 | Disposition: A | Discharge status: Home / Self Care | Payer: PPO | Attending: Emergency Medicine | Admitting: Emergency Medicine

## 2023-01-26 ENCOUNTER — Encounter (HOSPITAL_COMMUNITY)
Admission: RE | Disposition: A | Discharge status: Home / Self Care | Payer: Self-pay | Source: Home / Self Care | Attending: Emergency Medicine

## 2023-01-26 DIAGNOSIS — Z6833 Body mass index (BMI) 33.0-33.9, adult: Secondary | ICD-10-CM | POA: Diagnosis not present

## 2023-01-26 DIAGNOSIS — R911 Solitary pulmonary nodule: Secondary | ICD-10-CM | POA: Diagnosis present

## 2023-01-26 DIAGNOSIS — J9811 Atelectasis: Secondary | ICD-10-CM | POA: Diagnosis not present

## 2023-01-26 DIAGNOSIS — Z79899 Other long term (current) drug therapy: Secondary | ICD-10-CM | POA: Insufficient documentation

## 2023-01-26 DIAGNOSIS — Z8546 Personal history of malignant neoplasm of prostate: Secondary | ICD-10-CM | POA: Diagnosis not present

## 2023-01-26 DIAGNOSIS — I10 Essential (primary) hypertension: Secondary | ICD-10-CM | POA: Diagnosis not present

## 2023-01-26 DIAGNOSIS — I129 Hypertensive chronic kidney disease with stage 1 through stage 4 chronic kidney disease, or unspecified chronic kidney disease: Secondary | ICD-10-CM | POA: Insufficient documentation

## 2023-01-26 DIAGNOSIS — N183 Chronic kidney disease, stage 3 unspecified: Secondary | ICD-10-CM | POA: Insufficient documentation

## 2023-01-26 DIAGNOSIS — Z86718 Personal history of other venous thrombosis and embolism: Secondary | ICD-10-CM | POA: Insufficient documentation

## 2023-01-26 DIAGNOSIS — M1990 Unspecified osteoarthritis, unspecified site: Secondary | ICD-10-CM | POA: Diagnosis not present

## 2023-01-26 DIAGNOSIS — R918 Other nonspecific abnormal finding of lung field: Secondary | ICD-10-CM | POA: Diagnosis not present

## 2023-01-26 DIAGNOSIS — E669 Obesity, unspecified: Secondary | ICD-10-CM | POA: Diagnosis not present

## 2023-01-26 DIAGNOSIS — F418 Other specified anxiety disorders: Secondary | ICD-10-CM | POA: Diagnosis not present

## 2023-01-26 DIAGNOSIS — C3491 Malignant neoplasm of unspecified part of right bronchus or lung: Principal | ICD-10-CM | POA: Insufficient documentation

## 2023-01-26 DIAGNOSIS — J939 Pneumothorax, unspecified: Secondary | ICD-10-CM | POA: Diagnosis present

## 2023-01-26 DIAGNOSIS — Z7901 Long term (current) use of anticoagulants: Secondary | ICD-10-CM | POA: Insufficient documentation

## 2023-01-26 DIAGNOSIS — R079 Chest pain, unspecified: Secondary | ICD-10-CM | POA: Diagnosis not present

## 2023-01-26 HISTORY — DX: Gastro-esophageal reflux disease without esophagitis: K21.9

## 2023-01-26 HISTORY — DX: Depression, unspecified: F32.A

## 2023-01-26 HISTORY — DX: Anxiety disorder, unspecified: F41.9

## 2023-01-26 HISTORY — PX: BRONCHIAL BIOPSY: SHX5109

## 2023-01-26 HISTORY — PX: VIDEO BRONCHOSCOPY WITH RADIAL ENDOBRONCHIAL ULTRASOUND: SHX6849

## 2023-01-26 HISTORY — DX: Acute embolism and thrombosis of unspecified deep veins of unspecified lower extremity: I82.409

## 2023-01-26 HISTORY — DX: Unspecified osteoarthritis, unspecified site: M19.90

## 2023-01-26 HISTORY — PX: BRONCHIAL BRUSHINGS: SHX5108

## 2023-01-26 HISTORY — PX: BRONCHIAL NEEDLE ASPIRATION BIOPSY: SHX5106

## 2023-01-26 LAB — CULTURE, BAL-QUANTITATIVE W GRAM STAIN

## 2023-01-26 LAB — GLUCOSE, CAPILLARY: Glucose-Capillary: 108 mg/dL — ABNORMAL HIGH (ref 70–99)

## 2023-01-26 SURGERY — BRONCHOSCOPY, WITH BIOPSY USING ELECTROMAGNETIC NAVIGATION
Anesthesia: General | Laterality: Bilateral

## 2023-01-26 MED ORDER — FENTANYL CITRATE (PF) 250 MCG/5ML IJ SOLN
INTRAMUSCULAR | Status: DC | PRN
  Administered 2023-01-26: 50 ug via INTRAVENOUS

## 2023-01-26 MED ORDER — ONDANSETRON HCL 4 MG/2ML IJ SOLN
INTRAMUSCULAR | Status: DC | PRN
  Administered 2023-01-26: 4 mg via INTRAVENOUS

## 2023-01-26 MED ORDER — LIDOCAINE 2% (20 MG/ML) 5 ML SYRINGE
INTRAMUSCULAR | Status: DC | PRN
  Administered 2023-01-26: 100 mg via INTRAVENOUS

## 2023-01-26 MED ORDER — DOCUSATE SODIUM 100 MG PO CAPS: 100.0000 mg | ORAL_CAPSULE | Freq: Two times a day (BID) | ORAL | Status: DC | PRN

## 2023-01-26 MED ORDER — ROCURONIUM BROMIDE 10 MG/ML (PF) SYRINGE
PREFILLED_SYRINGE | INTRAVENOUS | Status: DC | PRN
  Administered 2023-01-26: 30 mg via INTRAVENOUS
  Administered 2023-01-26: 70 mg via INTRAVENOUS
  Administered 2023-01-26: 10 mg via INTRAVENOUS

## 2023-01-26 MED ORDER — POLYETHYLENE GLYCOL 3350 17 G PO PACK: 17.0000 g | PACK | Freq: Every day | ORAL | Status: DC | PRN

## 2023-01-26 MED ORDER — PROPOFOL 10 MG/ML IV BOLUS
INTRAVENOUS | Status: DC | PRN
  Administered 2023-01-26: 160 mg via INTRAVENOUS

## 2023-01-26 MED ORDER — LAMOTRIGINE 100 MG PO TABS
100.0000 mg | ORAL_TABLET | Freq: Two times a day (BID) | ORAL | Status: DC
  Administered 2023-01-26 – 2023-01-27 (×2): 100 mg via ORAL
  Filled 2023-01-26: qty 4
  Filled 2023-01-26 (×3): qty 1
  Filled 2023-01-26: qty 4

## 2023-01-26 MED ORDER — LACTATED RINGERS IV SOLN: INTRAVENOUS | Status: DC

## 2023-01-26 MED ORDER — SODIUM CHLORIDE 0.9% FLUSH
10.0000 mL | Freq: Three times a day (TID) | INTRAVENOUS | Status: DC
  Administered 2023-01-26 (×2): 10 mL via INTRAPLEURAL

## 2023-01-26 MED ORDER — SUGAMMADEX SODIUM 200 MG/2ML IV SOLN
INTRAVENOUS | Status: DC | PRN
  Administered 2023-01-26: 400 mg via INTRAVENOUS

## 2023-01-26 MED ORDER — RIVAROXABAN 10 MG PO TABS: 10.0000 mg | ORAL_TABLET | Freq: Every day | ORAL | Status: DC

## 2023-01-26 MED ORDER — RIVAROXABAN 10 MG PO TABS
10.0000 mg | ORAL_TABLET | Freq: Every day | ORAL | Status: DC
  Administered 2023-01-27: 10 mg via ORAL
  Filled 2023-01-26: qty 1

## 2023-01-26 MED ORDER — PHENYLEPHRINE HCL-NACL 20-0.9 MG/250ML-% IV SOLN
INTRAVENOUS | Status: DC | PRN
  Administered 2023-01-26: 35 ug/min via INTRAVENOUS

## 2023-01-26 MED ORDER — ACETAMINOPHEN 500 MG PO TABS
1000.0000 mg | ORAL_TABLET | Freq: Four times a day (QID) | ORAL | Status: DC | PRN
  Administered 2023-01-26 (×2): 1000 mg via ORAL
  Filled 2023-01-26 (×2): qty 2

## 2023-01-26 MED ORDER — CLONAZEPAM 1 MG PO TABS
1.0000 mg | ORAL_TABLET | Freq: Two times a day (BID) | ORAL | Status: DC
  Administered 2023-01-26 – 2023-01-27 (×2): 1 mg via ORAL
  Filled 2023-01-26 (×2): qty 1

## 2023-01-26 MED ORDER — DEXAMETHASONE SODIUM PHOSPHATE 10 MG/ML IJ SOLN
INTRAMUSCULAR | Status: DC | PRN
  Administered 2023-01-26: 10 mg via INTRAVENOUS

## 2023-01-26 MED ORDER — BUPROPION HCL ER (SR) 150 MG PO TB12
150.0000 mg | ORAL_TABLET | Freq: Three times a day (TID) | ORAL | Status: DC
  Administered 2023-01-26 – 2023-01-27 (×4): 150 mg via ORAL
  Filled 2023-01-26 (×5): qty 1

## 2023-01-26 MED ORDER — CHLORHEXIDINE GLUCONATE 0.12 % MT SOLN
OROMUCOSAL | Status: AC
  Administered 2023-01-26: 15 mL
  Filled 2023-01-26: qty 15

## 2023-01-26 MED ORDER — ATORVASTATIN CALCIUM 40 MG PO TABS
40.0000 mg | ORAL_TABLET | Freq: Every day | ORAL | Status: DC
  Administered 2023-01-27: 40 mg via ORAL
  Filled 2023-01-26: qty 1

## 2023-01-26 MED ORDER — METOPROLOL SUCCINATE ER 100 MG PO TB24
100.0000 mg | ORAL_TABLET | Freq: Every day | ORAL | Status: DC
  Filled 2023-01-26: qty 1

## 2023-01-26 MED ORDER — PROPOFOL 500 MG/50ML IV EMUL
INTRAVENOUS | Status: DC | PRN
  Administered 2023-01-26: 150 ug/kg/min via INTRAVENOUS

## 2023-01-26 NOTE — Progress Notes (Signed)
Patient arrived to the room from PACU. A/O x4. No pain complained at this time. VSS. Chest tube is in place,site looks  dry and intact. Oriented patient to the room and staff. Education provided regarding safety precaution.

## 2023-01-26 NOTE — Discharge Instructions (Signed)
Flexible Bronchoscopy, Care After This sheet gives you information about how to care for yourself after your test. Your doctor may also give you more specific instructions. If you have problems or questions, contact your doctor. Follow these instructions at home: Eating and drinking When your numbness is gone and your cough and gag reflexes have come back, you may: Eat only soft foods. Slowly drink liquids. The day after the test, go back to your normal diet. Driving Do not drive for 24 hours if you were given a medicine to help you relax (sedative). Do not drive or use heavy machinery while taking prescription pain medicine. General instructions  Take over-the-counter and prescription medicines only as told by your doctor. Return to your normal activities as told. Ask what activities are safe for you. Do not use any products that have nicotine or tobacco in them. This includes cigarettes and e-cigarettes. If you need help quitting, ask your doctor. Keep all follow-up visits as told by your doctor. This is important. It is very important if you had a tissue sample (biopsy) taken. Get help right away if: You have shortness of breath that gets worse. You get light-headed. You feel like you are going to pass out (faint). You have chest pain. You cough up: More than a little blood. More blood than before. Summary Do not eat or drink anything (not even water) for 2 hours after your test, or until your numbing medicine wears off. Do not use cigarettes. Do not use e-cigarettes. Get help right away if you have chest pain.  Please call our office for any questions or concerns.  [id number]. Okay to restart your Xarelto on 01/27/2023.  This information is not intended to replace advice given to you by your health care provider. Make sure you discuss any questions you have with your health care provider. Document Released: 07/20/2009 Document Revised: 09/04/2017 Document Reviewed:  10/10/2016 Elsevier Patient Education  2020 [employer].

## 2023-01-26 NOTE — Procedures (Signed)
Insertion of Chest Tube Procedure Note  Richard Delacruz  [id number]  December 25, 1950  Date:01/26/23  Time:3:07 PM   Provider Performing: Rachel Bo Mansour Balboa   Procedure: Chest Tube Insertion (32551)  Indication(s) Pneumothorax  Consent Risks of the procedure as well as the alternatives and risks of each were explained to the patient and/or caregiver.  Consent for the procedure was obtained and is signed in the bedside chart  Anesthesia Topical only with 1% lidocaine   Time Out Verified patient identification, verified procedure, site/side was marked, verified correct patient position, special equipment/implants available, medications/allergies/relevant history reviewed, required imaging and test results available.  Sterile Technique Maximal sterile technique including full sterile barrier drape, hand hygiene, sterile gown, sterile gloves, mask, hair covering, sterile ultrasound probe cover (if used).  Procedure Description Ultrasound used to identify appropriate pleural anatomy for placement and overlying skin marked. Area of placement cleaned and draped in sterile fashion.  A 14 French pigtail pleural catheter was placed into the left pleural space using Seldinger technique. Appropriate return of air was obtained.  The tube was connected to atrium and placed on -20 cm H2O wall suction.  Complications/Tolerance None; patient tolerated the procedure well. Chest X-ray is ordered to verify placement.  EBL Minimal  Specimen(s) none  Josephine Igo, DO [facility] 01/26/2023 3:08 PM

## 2023-01-26 NOTE — H&P (Signed)
NAME:  Richard Delacruz, MRN:  [id number], DOB:  1951/02/01, LOS: 0 ADMISSION DATE:  01/26/2023, CONSULTATION DATE:  01/26/2023 REFERRING MD:  Dr. Delton Coombes, CHIEF COMPLAINT:  Post-procedure pneumothorax    History of Present Illness:  72 year old male with PMH prostate cancer, melanoma, HTN, GERD, DVT, HLD CKD stage 3, bipolar. Followed by pulmonary for lung nodules. Presents for scheduled EBUS 4/22. Post-procedurally with left pneumothorax requiring chest tube insertion.   Pertinent  Medical History  prostate cancer, melanoma of R arm, hypertension, polyp of the colon, GERD, history of DVT, hyperlipidemia, CKD stage III, migraines, bipolar disorder   Significant Hospital Events: Including procedures, antibiotic start and stop dates in addition to other pertinent events   4/22 Presents for EBUS. Pneumothorax > chest tube placed   Interim History / Subjective:  As above   Objective   Blood pressure 118/71, pulse (!) 57, temperature (!) 97.3 F (36.3 C), resp. rate (!) 26, height  (1.753 m), weight 104.3 kg, SpO2 91 %.       No intake or output data in the 24 hours ending 01/26/23 1511 Filed Weights   01/26/23 0825  Weight: 104.3 kg    Examination: General: Adult male, lying in bed, no distress  HENT: MMM Lungs: no use of accessory muscles, chest tube in place to left anterior chest wall  Cardiovascular: RRR, HR 82 Abdomen: Obese, distended, non-tender Extremities: -edema Neuro: alert, oriented, follows commands  GU: intact   Resolved Hospital Problem list     Assessment & Plan:   Left Pneumothorax in post-operative setting  Plan - Maintain Chest tube to -20.  - CXR in AM  - Titrate supplemental oxygen for saturation goal >92  DVT on Xarelto Plan - Restart Xarelto in AM   HTN HLD Plan - continue home metoprolol and statin   Bipolar  Plan - Continue home klonopin, Wellbutrin, Lamictal   Best Practice (right click and "Reselect all SmartList Selections"  daily)   Diet/type: Regular consistency (see orders) DVT prophylaxis: DOAC in AM  GI prophylaxis: N/A Lines: N/A Foley:  N/A Code Status:  full code Last date of multidisciplinary goals of care discussion [pending]  Labs   CBC: No results for input(s): "WBC", "NEUTROABS", "HGB", "HCT", "MCV", "PLT" in the last 168 hours.  Basic Metabolic Panel: No results for input(s): "NA", "K", "CL", "CO2", "GLUCOSE", "BUN", "CREATININE", "CALCIUM", "MG", "PHOS" in the last 168 hours. GFR: CrCl cannot be calculated (Patient's most recent lab result is older than the maximum 21 days allowed.). No results for input(s): "PROCALCITON", "WBC", "LATICACIDVEN" in the last 168 hours.  Liver Function Tests: No results for input(s): "AST", "ALT", "ALKPHOS", "BILITOT", "PROT", "ALBUMIN" in the last 168 hours. No results for input(s): "LIPASE", "AMYLASE" in the last 168 hours. No results for input(s): "AMMONIA" in the last 168 hours.  ABG No results found for: "PHART", "PCO2ART", "PO2ART", "HCO3", "TCO2", "ACIDBASEDEF", "O2SAT"   Coagulation Profile: No results for input(s): "INR", "PROTIME" in the last 168 hours.  Cardiac Enzymes: No results for input(s): "CKTOTAL", "CKMB", "CKMBINDEX", "TROPONINI" in the last 168 hours.  HbA1C: No results found for: "HGBA1C"  CBG: No results for input(s): "GLUCAP" in the last 168 hours.  Review of Systems:   Denies Pain, SOB  Past Medical History:  He,  has a past medical history of Anxiety, Arthritis, Cancer, Depression, DVT (deep venous thrombosis), GERD (gastroesophageal reflux disease), and Hypertension.   Surgical History:   Past Surgical History:  Procedure Laterality Date  ADENOIDECTOMY     COLONOSCOPY     TONSILLECTOMY       Social History:   reports that he has never smoked. He has been exposed to tobacco smoke. He has never used smokeless tobacco. He reports that he does not drink alcohol and does not use drugs.   Family History:  His  family history is not on file.   Allergies Allergies  Allergen Reactions   Atacand [Candesartan] Anaphylaxis   Amlodipine     Swelling, joint and muscle pain    Monopril [Fosinopril] Cough   Naproxen Other (See Comments)    Abdominal pain    Niaspan [Niacin Er] Rash     Home Medications  Prior to Admission medications   Medication Sig Start Date End Date Taking? Authorizing Provider  acetaminophen (TYLENOL) 500 MG tablet Take 1,000 mg by mouth every 6 (six) hours as needed for moderate pain.   Yes [provider]  atorvastatin (LIPITOR) 40 MG tablet Take 40 mg by mouth daily. 02/13/16  Yes [provider]  buPROPion (WELLBUTRIN SR) 150 MG 12 hr tablet Take 150 mg by mouth 3 (three) times daily. 04/25/14  Yes [provider]  Calcium Citrate-Vitamin D (CALCIUM + D PO) Take 2 tablets by mouth daily.   Yes [provider]  clonazePAM (KLONOPIN) 1 MG tablet Take 1 mg by mouth 2 (two) times daily.  05/26/14  Yes [provider]  diphenhydramine-acetaminophen (TYLENOL PM) 25-500 MG TABS tablet Take 2 tablets by mouth at bedtime.   Yes [provider]  lamoTRIgine (LAMICTAL) 100 MG tablet Take 100 mg by mouth 2 (two) times daily. 03/06/14  Yes [provider]  metoprolol succinate (TOPROL-XL) 100 MG 24 hr tablet Take 100 mg by mouth daily. Take with or immediately following a meal.   Yes [provider]  Multiple Vitamin (MULTIVITAMIN WITH MINERALS) TABS tablet Take 1 tablet by mouth daily.   Yes [provider]  rivaroxaban (XARELTO) 10 MG TABS tablet Take 1 tablet (10 mg total) by mouth daily. Okay to restart this medication on 01/27/2023 01/26/23   Leslye Peer, MD     Time Spent: 32 minutes     Jovita Kussmaul, AGACNP-BC [facility]  PCCM Pgr: [phone]

## 2023-01-26 NOTE — Transfer of Care (Signed)
Immediate Anesthesia Transfer of Care Note  Patient: Richard Delacruz  Procedure(s) Performed: ROBOTIC ASSISTED NAVIGATIONAL BRONCHOSCOPY (Bilateral) VIDEO BRONCHOSCOPY WITH RADIAL ENDOBRONCHIAL ULTRASOUND BRONCHIAL BRUSHINGS BRONCHIAL NEEDLE ASPIRATION BIOPSIES BRONCHIAL BIOPSIES  Patient Location: PACU  Anesthesia Type:General  Level of Consciousness: sedated  Airway & Oxygen Therapy: Patient connected to face mask oxygen  Post-op Assessment: Post -op Vital signs reviewed and stable  Post vital signs: stable  Last Vitals:  Vitals Value Taken Time  BP 149/81 01/26/23 1337  Temp 36.3 C 01/26/23 1337  Pulse 60 01/26/23 1341  Resp 16 01/26/23 1341  SpO2 90 % 01/26/23 1341  Vitals shown include unvalidated device data.  Last Pain:  Vitals:   01/26/23 1337  TempSrc:   PainSc: 0-No pain         Complications: No notable events documented.

## 2023-01-26 NOTE — Anesthesia Procedure Notes (Signed)
Procedure Name: Intubation Date/Time: 01/26/2023 11:39 AM  Performed by: Dorie Rank, CRNAPre-anesthesia Checklist: Patient identified, Emergency Drugs available, Suction available, Patient being monitored and Timeout performed Patient Re-evaluated:Patient Re-evaluated prior to induction Oxygen Delivery Method: Circle system utilized Preoxygenation: Pre-oxygenation with 100% oxygen Induction Type: IV induction Ventilation: Nasal airway inserted- appropriate to patient size and Two handed mask ventilation required Laryngoscope Size: Mac and 3 Grade View: Grade I Tube type: Oral Tube size: 8.5 mm Number of attempts: 1 Airway Equipment and Method: Stylet Placement Confirmation: ETT inserted through vocal cords under direct vision, positive ETCO2 and breath sounds checked- equal and bilateral Secured at: 23 cm Tube secured with: Tape Dental Injury: Teeth and Oropharynx as per pre-operative assessment

## 2023-01-26 NOTE — Op Note (Signed)
Video Bronchoscopy with Robotic Assisted Bronchoscopic Navigation   Date of Operation: 01/26/2023   Pre-op Diagnosis: Bilateral pulmonary nodules  Post-op Diagnosis: Same  Surgeon: Levy Pupa  Assistants: None  Anesthesia: General endotracheal anesthesia  Operation: Flexible video fiberoptic bronchoscopy with robotic assistance and biopsies.  Estimated Blood Loss: Minimal  Complications: None  Indications and History: Richard Delacruz is a 72 y.o. male never smoker with with history of prostate cancer, melanoma.  He has slowly enlarging bilateral pulmonary nodules on chest imaging.  Recommendation made to achieve a tissue diagnosis via robotic assisted navigational bronchoscopy. The risks, benefits, complications, treatment options and expected outcomes were discussed with the patient.  The possibilities of pneumothorax, pneumonia, reaction to medication, pulmonary aspiration, perforation of a viscus, bleeding, failure to diagnose a condition and creating a complication requiring transfusion or operation were discussed with the patient who freely signed the consent.    Description of Procedure: The patient was seen in the Preoperative Area, was examined and was deemed appropriate to proceed.  The patient was taken to [facility] endoscopy room 3, identified as Richard Delacruz and the procedure verified as Flexible Video Fiberoptic Bronchoscopy.  A Time Out was held and the above information confirmed.   Prior to the date of the procedure a high-resolution CT scan of the chest was performed. Utilizing ION software program a virtual tracheobronchial tree was generated to allow the creation of distinct navigation pathways to the patient's parenchymal abnormalities. After being taken to the operating room general anesthesia was initiated and the patient  was orally intubated. The video fiberoptic bronchoscope was introduced via the endotracheal tube and a general inspection was performed which  showed normal right and left lung anatomy. Aspiration of the bilateral mainstems was completed to remove any remaining secretions. Robotic catheter inserted into patient's endotracheal tube.   Target #1 lingular pulmonary nodule: The distinct navigation pathways prepared prior to this procedure were then utilized to navigate to patient's lesion identified on CT scan. The robotic catheter was secured into place and the vision probe was withdrawn.  Lesion location was approximated using fluoroscopy and radial endobronchial ultrasound for peripheral targeting.  Local registration and targeting was performed using Cios three-dimensional imaging.  Under fluoroscopic guidance transbronchial needle brushings, transbronchial needle biopsies were performed to be sent for cytology and pathology.   Target #2 left upper lobe pulmonary nodule: The distinct navigation pathways prepared prior to this procedure were then utilized to navigate to patient's lesion identified on CT scan. The robotic catheter was secured into place and the vision probe was withdrawn.  Lesion location was approximated using fluoroscopy and radial endobronchial ultrasound for peripheral targeting.  Local registration and targeting was performed using Cios three-dimensional imaging. Under fluoroscopic guidance transbronchial needle brushings, transbronchial needle biopsies, and transbronchial forceps biopsies were performed to be sent for cytology and pathology.  Target #3 right upper lobe pulmonary nodule: The distinct navigation pathways prepared prior to this procedure were then utilized to navigate to patient's lesion identified on CT scan. The robotic catheter was secured into place and the vision probe was withdrawn.  Lesion location was approximated using fluoroscopy for peripheral targeting.  Local registration and targeting was performed using Cios three-dimensional imaging. Under fluoroscopic guidance transbronchial needle brushings,  transbronchial needle biopsies, and transbronchial forceps biopsies were performed to be sent for cytology and pathology. A bronchioalveolar lavage was performed in the right upper lobe and sent for cytology and microbiology.   At the end of the procedure a general  airway inspection was performed and there was no evidence of active bleeding. The bronchoscope was removed.  The patient tolerated the procedure well. There was no significant blood loss and there were no obvious complications. A post-procedural chest x-ray is pending.  Samples Target #1: 1. Transbronchial needle brushings from lingular pulmonary nodule 2. Transbronchial Wang needle biopsies from lingular pulmonary nodule  Samples Target #2: 1. Transbronchial needle brushings from left upper lobe pulmonary nodule 2. Transbronchial Wang needle biopsies from left upper lobe pulmonary nodule 3. Transbronchial forceps biopsies from left upper lobe pulmonary nodule  Samples Target #3: 1. Transbronchial needle brushings from right upper lobe pulmonary nodule 2. Transbronchial Wang needle biopsies from right upper lobe pulmonary nodule 3. Transbronchial forceps biopsies from right upper lobe pulmonary nodule 4. Bronchoalveolar lavage from right upper lobe   Plans:  The patient will be discharged from the PACU to home when recovered from anesthesia and after chest x-ray is reviewed. We will review the cytology, pathology and microbiology results with the patient when they become available. Outpatient followup will be with Dr. Delton Coombes.    Levy Pupa, MD, PhD 01/26/2023, 1:28 PM [facility] [phone] or if no answer before 7:00PM call [phone] For any issues after 7:00PM please call eLink [phone]

## 2023-01-26 NOTE — Plan of Care (Signed)

## 2023-01-26 NOTE — Interval H&P Note (Signed)
History and Physical Interval Note:  01/26/2023 9:16 AM  Richard Delacruz  has presented today for surgery, with the diagnosis of bilateral pulmonary nodules.  The various methods of treatment have been discussed with the patient and family. After consideration of risks, benefits and other options for treatment, the patient has consented to  Procedure(s): ROBOTIC ASSISTED NAVIGATIONAL BRONCHOSCOPY (Bilateral) as a surgical intervention.  The patient's history has been reviewed, patient examined, no change in status, stable for surgery.  I have reviewed the patient's chart and labs.  Questions were answered to the patient's satisfaction.     Leslye Peer

## 2023-01-26 NOTE — Anesthesia Preprocedure Evaluation (Addendum)
Anesthesia Evaluation  Patient identified by MRN, date of birth, ID band Patient awake    Reviewed: Allergy & Precautions, NPO status , Patient's Chart, lab work & pertinent test results  Airway Mallampati: II  TM Distance: >3 FB Neck ROM: Full    Dental  (+) Dental Advisory Given, Chipped   Pulmonary neg pulmonary ROS   Pulmonary exam normal breath sounds clear to auscultation       Cardiovascular hypertension, Pt. on home beta blockers Normal cardiovascular exam Rhythm:Regular Rate:Normal     Neuro/Psych  PSYCHIATRIC DISORDERS Anxiety Depression    negative neurological ROS     GI/Hepatic Neg liver ROS,GERD  Controlled,,  Endo/Other  negative endocrine ROS    Renal/GU negative Renal ROS     Musculoskeletal  (+) Arthritis ,    Abdominal  (+) + obese  Peds  Hematology negative hematology ROS (+)   Anesthesia Other Findings   Reproductive/Obstetrics                             Anesthesia Physical Anesthesia Plan  ASA: 3  Anesthesia Plan: General   Post-op Pain Management: Minimal or no pain anticipated   Induction: Intravenous  PONV Risk Score and Plan: 2 and Ondansetron, Dexamethasone, Treatment may vary due to age or medical condition, TIVA and Propofol infusion  Airway Management Planned: Oral ETT  Additional Equipment:   Intra-op Plan:   Post-operative Plan: Extubation in OR  Informed Consent: I have reviewed the patients History and Physical, chart, labs and discussed the procedure including the risks, benefits and alternatives for the proposed anesthesia with the patient or authorized representative who has indicated his/her understanding and acceptance.     Dental advisory given  Plan Discussed with: CRNA  Anesthesia Plan Comments:        Anesthesia Quick Evaluation

## 2023-01-27 ENCOUNTER — Telehealth: Payer: Self-pay | Admitting: Pulmonary Disease

## 2023-01-27 ENCOUNTER — Observation Stay (HOSPITAL_COMMUNITY): Payer: PPO

## 2023-01-27 DIAGNOSIS — I700 Atherosclerosis of aorta: Secondary | ICD-10-CM | POA: Diagnosis not present

## 2023-01-27 DIAGNOSIS — J95811 Postprocedural pneumothorax: Secondary | ICD-10-CM

## 2023-01-27 DIAGNOSIS — R918 Other nonspecific abnormal finding of lung field: Secondary | ICD-10-CM | POA: Diagnosis not present

## 2023-01-27 DIAGNOSIS — R911 Solitary pulmonary nodule: Secondary | ICD-10-CM | POA: Diagnosis not present

## 2023-01-27 DIAGNOSIS — J939 Pneumothorax, unspecified: Secondary | ICD-10-CM | POA: Diagnosis not present

## 2023-01-27 DIAGNOSIS — J9811 Atelectasis: Secondary | ICD-10-CM | POA: Diagnosis not present

## 2023-01-27 DIAGNOSIS — C3491 Malignant neoplasm of unspecified part of right bronchus or lung: Secondary | ICD-10-CM | POA: Diagnosis not present

## 2023-01-27 LAB — CULTURE, BAL-QUANTITATIVE W GRAM STAIN: Gram Stain: NONE SEEN

## 2023-01-27 MED ORDER — RIVAROXABAN 10 MG PO TABS: 10.0000 mg | ORAL_TABLET | Freq: Every day | ORAL | 0 refills | Status: AC

## 2023-01-27 NOTE — Plan of Care (Signed)

## 2023-01-27 NOTE — Anesthesia Postprocedure Evaluation (Signed)
Anesthesia Post Note  Patient: Richard Delacruz  Procedure(s) Performed: ROBOTIC ASSISTED NAVIGATIONAL BRONCHOSCOPY (Bilateral) VIDEO BRONCHOSCOPY WITH RADIAL ENDOBRONCHIAL ULTRASOUND BRONCHIAL BRUSHINGS BRONCHIAL NEEDLE ASPIRATION BIOPSIES BRONCHIAL BIOPSIES     Patient location during evaluation: PACU Anesthesia Type: General Level of consciousness: sedated and patient cooperative Pain management: pain level controlled Vital Signs Assessment: post-procedure vital signs reviewed and stable Respiratory status: spontaneous breathing Cardiovascular status: stable Anesthetic complications: no   No notable events documented.  Last Vitals:  Vitals:   01/27/23 0402 01/27/23 0728  BP: 131/71 127/82  Pulse: (!) 55 (!) 55  Resp: 18 20  Temp: 36.8 C 36.8 C  SpO2: 98% 97%    Last Pain:  Vitals:   01/27/23 0804  TempSrc:   PainSc: 0-No pain                 Lewie Loron

## 2023-01-27 NOTE — Progress Notes (Signed)
Discharge order was written yesterday. Patient still has a Chest Tube that needs to be pulled and followed by a CXR. According to nurse if he discharges it would be later this evening.   Orvan Seen SWOT RN

## 2023-01-27 NOTE — Telephone Encounter (Signed)
Patient scheduled for first available on 02/05/2023 at 2pm with Rhunette Croft, NP. Reminder mailed to address on file.

## 2023-01-27 NOTE — Progress Notes (Signed)
19:40 pm   Patient at this time declined medications due for tonight, prior to discharge.  Patient is alert and oriented, eager to go home. RN checked previous chest tube site, clean dry & intact.  Patient has no complaint of pain at this time.  Patient is dressed, personal belongings are within reach.  Patient will be discharging home with a friend to drive him home.  AVS instructions/packet has been provided; patient seems to understand discharge instructions.    Update:  Patient has been picked up by a friend. Heading home.

## 2023-01-27 NOTE — Progress Notes (Signed)
Chest tube removed by [facility] Nurse. Site looks clean and  dry.

## 2023-01-27 NOTE — Discharge Summary (Signed)
Physician Discharge Summary         Patient ID: Richard Delacruz MRN: [id number] DOB/AGE: 1951/02/25 72 y.o.  Admit date: 01/26/2023 Discharge date: 01/27/2023  Discharge Diagnoses:    Active Hospital Problems   Diagnosis Date Noted   Pulmonary nodules 01/20/2023   Pneumothorax 01/26/2023    Resolved Hospital Problems  No resolved problems to display.      Discharge summary    72 year old male with PMH prostate cancer, melanoma, HTN, GERD, DVT, HLD CKD stage 3, bipolar. Followed by pulmonary for lung nodules. Presents for scheduled EBUS 4/22. Post-procedurally with left pneumothorax requiring chest tube insertion. PTX resolved and CT was able to be removed 4/23. Discharged to home in improved condition.   Discharge Plan by Active Problems    Left Pneumothorax in post-operative setting  Plan Required chest tube placement.  Pneumothorax resolved 4/23 and chest and was able to be removed.  He will follow-up in pulmonary office with a repeat chest x-ray in the next week   DVT on Xarelto Plan Restarted Xarelto   HTN HLD Plan Continued home statin and metoprolol   Bipolar  Plan Continued home Klonopin, Lamictal and Wellbutrin   Significant Hospital tests/ studies   Procedures    Culture data/antimicrobials      Consults      Discharge Exam: BP 109/68 (BP Location: Right Arm)   Pulse 62   Temp 98.2 F (36.8 C) (Oral)   Resp 20   Ht 5\' 9"  (1.753 m)   Wt 104.3 kg   SpO2 94%   BMI 33.97 kg/m   Gen: Pleasant, well-nourished, in no distress,  normal affect  ENT: No lesions,  mouth clear,  oropharynx clear, no postnasal drip  Neck: No JVD, no stridor  Lungs: No use of accessory muscles, no crackles or wheezing on normal respiration, no wheeze on forced expiration  Cardiovascular: RRR, heart sounds normal, no murmur or gallops, no peripheral edema  Musculoskeletal: No deformities, no cyanosis or clubbing  Neuro: alert, awake, non focal  Skin:  Warm, no lesions or rash    Labs at discharge   Lab Results  Component Value Date   CREATININE 1.17 12/30/2022   BUN 16 12/30/2022   NA 141 12/30/2022   K 4.4 12/30/2022   CL 103 12/30/2022   CO2 24 12/30/2022   Lab Results  Component Value Date   WBC 6.8 12/30/2022   HGB 16.1 12/30/2022   HCT 49.8 12/30/2022   MCV 96.9 12/30/2022   PLT 203 12/30/2022   No results found for: "ALT", "AST", "GGT", "ALKPHOS", "BILITOT" Lab Results  Component Value Date   INR 1.07 06/08/2014    Current radiological studies    DG CHEST PORT 1 VIEW  Result Date: 01/27/2023 CLINICAL DATA:  Chest tube removal, prior pneumothorax EXAM: PORTABLE CHEST 1 VIEW COMPARISON:  /23/24 at 12:52 p.m. FINDINGS: The pigtail left pleural drainage catheter has been removed. No pneumothorax. Persistent bandlike density in the left mid lung probably from atelectasis. There is also some mild atelectasis or scarring medially at the right lung base. Low lung volumes are present, causing crowding of the pulmonary vasculature. Atherosclerotic calcification of the aortic arch. Thoracic spondylosis. Degenerative left glenohumeral arthropathy with suspected free osteochondral fragments in the left subscapular recess. The lung nodule seen on recent chest CT are much more conspicuous on that exam. IMPRESSION: 1. Interval removal of the left pleural drainage catheter, without pneumothorax. 2. Persistent bandlike density in the left mid lung  probably from atelectasis. 3. Degenerative left glenohumeral arthropathy with suspected free osteochondral fragments in the left subscapular recess. 4. Thoracic spondylosis. 5. Atherosclerotic calcification of the aortic arch. 6. Known lung nodules are more conspicuous on prior chest CT. Electronically Signed   By: Gaylyn Rong M.D.   On: 01/27/2023 17:21   DG CHEST PORT 1 VIEW  Result Date: 01/27/2023 CLINICAL DATA:  Left chest tube follow-up. EXAM: PORTABLE CHEST 1 VIEW COMPARISON:   Earlier today at 6:35 a.m. FINDINGS: 12:56 p.m. Left pleural pigtail catheter is slightly more peripherally positioned on the current exam. Patient rotated minimally left. Midline trachea. Cardiomegaly accentuated by AP portable technique. Atherosclerosis in the transverse aorta. Mild right hemidiaphragm elevation. No pleural effusion or pneumothorax. Persistent left perihilar and bibasilar atelectasis. IMPRESSION: No pneumothorax with left-sided pigtail pleural catheter remaining in place. Aortic Atherosclerosis (ICD10-I70.0). Electronically Signed   By: Jeronimo Greaves M.D.   On: 01/27/2023 13:41   DG CHEST PORT 1 VIEW  Result Date: 01/27/2023 CLINICAL DATA:  Pneumothorax EXAM: PORTABLE CHEST 1 VIEW COMPARISON:  X-ray 01/26/2023 and older FINDINGS: Left pigtail catheter again seen. The pigtail is incompletely formed on the current x-ray, unchanged from previous. Decreasing left lung base opacity. No pneumothorax on the current x-ray. No edema or effusion. Stable cardiopericardial silhouette. Degenerative changes of the spine IMPRESSION: Changing configuration of the left pigtail catheter with the pigtail incompletely formed on the current x-ray. No left-sided pneumothorax. Decreasing left lung base opacity Electronically Signed   By: Karen Kays M.D.   On: 01/27/2023 10:23   DG CHEST PORT 1 VIEW  Result Date: 01/26/2023 CLINICAL DATA:  Left pneumothorax EXAM: PORTABLE CHEST 1 VIEW COMPARISON:  Previous studies including the examination done earlier today FINDINGS: There is interval resolution of large left pneumothorax after placement of left chest tube. There is a pigtail left chest tube with its tip in the left parahilar region. There is partial re-expansion of left lung. Residual linear densities are seen in left mid and lower lung fields. Right lung is clear. Left lateral CP angle is indistinct. IMPRESSION: There is interval resolution of large left pneumothorax after placement of left chest tube. There  is significant interval improvement in aeration in left lung with residual subsegmental atelectasis in left mid and left lower lung fields. Left lateral CP angle is indistinct which maybe due to small effusion or small infiltrate in the adjacent lung. Electronically Signed   By: Ernie Avena M.D.   On: 01/26/2023 15:42   DG Chest Port 1 View  Result Date: 01/26/2023 CLINICAL DATA:  Post bronchoscopy and biopsy EXAM: PORTABLE CHEST 1 VIEW COMPARISON:  Portable exam 1401 hours compared to 11/19/2021 FINDINGS: Normal heart size mediastinal contours. Large LEFT pneumothorax with subtotal collapse of LEFT lung. No definite mediastinal shift. Mild atelectasis mid to lower RIGHT lung. No pleural effusion or RIGHT pneumothorax. IMPRESSION: Large LEFT pneumothorax with subtotal collapse of LEFT lung. No definite mediastinal shift/evidence of tension. Critical Value/emergent results were called by telephone at the time of interpretation on 01/26/2023 at 1430 hours to provider Levy Pupa MD, who verbally acknowledged these results. Electronically Signed   By: Ulyses Southward M.D.   On: 01/26/2023 14:31   DG C-ARM BRONCHOSCOPY  Result Date: 01/26/2023 C-ARM BRONCHOSCOPY: Fluoroscopy was utilized by the requesting physician.  No radiographic interpretation.    Disposition:    Discharge disposition: 01-Home or Self Care       Discharge Instructions     Diet - low sodium heart  healthy   Complete by: As directed    Discharge patient   Complete by: As directed    After chest x-ray has been reviewed   Discharge disposition: 01-Home or Self Care   Discharge patient date: 01/26/2023   Increase activity slowly   Complete by: As directed        Allergies as of 01/27/2023       Reactions   Atacand [candesartan] Anaphylaxis   Amlodipine    Swelling, joint and muscle pain    Monopril [fosinopril] Cough   Naproxen Other (See Comments)   Abdominal pain    Niaspan [niacin Er] Rash         Medication List     TAKE these medications    acetaminophen 500 MG tablet Commonly known as: TYLENOL Take 1,000 mg by mouth every 6 (six) hours as needed for moderate pain.   atorvastatin 40 MG tablet Commonly known as: LIPITOR Take 40 mg by mouth daily.   buPROPion 150 MG 12 hr tablet Commonly known as: WELLBUTRIN SR Take 150 mg by mouth 3 (three) times daily.   CALCIUM + D PO Take 2 tablets by mouth daily.   clonazePAM 1 MG tablet Commonly known as: KLONOPIN Take 1 mg by mouth 2 (two) times daily.   diphenhydramine-acetaminophen 25-500 MG Tabs tablet Commonly known as: TYLENOL PM Take 2 tablets by mouth at bedtime.   lamoTRIgine 100 MG tablet Commonly known as: LAMICTAL Take 100 mg by mouth 2 (two) times daily.   metoprolol succinate 100 MG 24 hr tablet Commonly known as: TOPROL-XL Take 100 mg by mouth daily. Take with or immediately following a meal.   multivitamin with minerals Tabs tablet Take 1 tablet by mouth daily.   rivaroxaban 10 MG Tabs tablet Commonly known as: XARELTO Take 1 tablet (10 mg total) by mouth daily. Okay to restart this medication on 01/27/2023 What changed: additional instructions   rivaroxaban 10 MG Tabs tablet Commonly known as: XARELTO Take 1 tablet (10 mg total) by mouth daily. Start taking on: January 28, 2023 What changed: You were already taking a medication with the same name, and this prescription was added. Make sure you understand how and when to take each.         Follow-up appointment   02/05/23 with Tobey Grim at [facility]   Discharge Condition:    good  Signed: Leslye Peer 01/27/2023, 6:55 PM

## 2023-01-27 NOTE — Progress Notes (Addendum)
NAME:  Richard Delacruz, MRN:  [id number], DOB:  [birth year], LOS: 0 ADMISSION DATE:  01/26/2023, CONSULTATION DATE:  01/26/2023 REFERRING MD:  Dr. Delton Coombes, CHIEF COMPLAINT:  Post-procedure pneumothorax    History of Present Illness:  72 year old male with PMH prostate cancer, melanoma, HTN, GERD, DVT, HLD CKD stage 3, bipolar. Followed by pulmonary for lung nodules. Presents for scheduled EBUS 4/22. Post-procedurally with left pneumothorax requiring chest tube insertion.   Pertinent  Medical History  prostate cancer, melanoma of R arm, hypertension, polyp of the colon, GERD, history of DVT, hyperlipidemia, CKD stage III, migraines, bipolar disorder   Significant Hospital Events: Including procedures, antibiotic start and stop dates in addition to other pertinent events   4/22 Presents for EBUS. Pneumothorax > chest tube placed  01/27/2023 chest tube placed to waterseal  Interim History / Subjective:  As above   Objective   Blood pressure 127/82, pulse (!) 55, temperature 98.2 F (36.8 C), temperature source Oral, resp. rate 20, height  (1.753 m), weight 104.3 kg, SpO2 97 %.        Intake/Output Summary (Last 24 hours) at 01/27/2023 0851 Last data filed at 01/27/2023 1308 Gross per 24 hour  Intake --  Output 900 ml  Net -900 ml   Filed Weights   01/26/23 0825  Weight: 104.3 kg    Examination: General adult male no acute distress HEENT no JVD or lymphadenopathy Lungs clear to auscultation bilaterally left chest tube in place with no air leaks changed to waterseal Heart sounds are regular underlying rhythm Abdomen soft nontender Extremities warm and dry  Resolved Hospital Problem list     Assessment & Plan:   Left Pneumothorax in post-operative setting  Plan 01/27/2023 suction removed from chest tube pneumothorax resolved still in the early a.m. chest x-ray Repeat chest x-ray ordered for 12 noon 01/27/2023. He may be able to be discharged later today.  DVT on  Xarelto Plan - Restart Xarelto in AM   HTN HLD Plan Continue home statin and metoprolol  Bipolar  Plan Continue home Klonopin, Lamictal and Wellbutrin  Best Practice (right click and "Reselect all SmartList Selections" daily)   Diet/type: Regular consistency (see orders) DVT prophylaxis: DOAC in AM  GI prophylaxis: N/A Lines: N/A Foley:  N/A Code Status:  full code Last date of multidisciplinary goals of care discussion [pending]  Labs   CBC: No results for input(s): "WBC", "NEUTROABS", "HGB", "HCT", "MCV", "PLT" in the last 168 hours.  Basic Metabolic Panel: No results for input(s): "NA", "K", "CL", "CO2", "GLUCOSE", "BUN", "CREATININE", "CALCIUM", "MG", "PHOS" in the last 168 hours. GFR: CrCl cannot be calculated (Patient's most recent lab result is older than the maximum 21 days allowed.). No results for input(s): "PROCALCITON", "WBC", "LATICACIDVEN" in the last 168 hours.  Liver Function Tests: No results for input(s): "AST", "ALT", "ALKPHOS", "BILITOT", "PROT", "ALBUMIN" in the last 168 hours. No results for input(s): "LIPASE", "AMYLASE" in the last 168 hours. No results for input(s): "AMMONIA" in the last 168 hours.  ABG No results found for: "PHART", "PCO2ART", "PO2ART", "HCO3", "TCO2", "ACIDBASEDEF", "O2SAT"   Coagulation Profile: No results for input(s): "INR", "PROTIME" in the last 168 hours.  Cardiac Enzymes: No results for input(s): "CKTOTAL", "CKMB", "CKMBINDEX", "TROPONINI" in the last 168 hours.  HbA1C: No results found for: "HGBA1C"  CBG: Recent Labs  Lab 01/26/23 1638  GLUCAP 108Brett Canales Minor ACNP Acute Care Nurse Practitioner [facility] Please consult Amion 01/27/2023, 8:51 AM  Attending Attestation: Attending Note:  I have examined patient, reviewed labs, studies and notes.   72 yo man s/p robotic navigational bronch 4/22 c/b L PTX. Chest tube in place.  CXR this am with resolution PTX.   Plan: -  change to waterseal now - follow CXR this afternoon. If no reaccumulation PTX then will consider d/c chest tube - once tube removed home if stable.  - ok to restart [facility] today - continue home meds  Levy Pupa, MD, PhD 01/27/2023, 11:35 AM [facility] [phone] or if no answer [phone]

## 2023-01-27 NOTE — Progress Notes (Signed)
PIV discontinued, Site looks  clean and dry.

## 2023-01-28 ENCOUNTER — Encounter (HOSPITAL_COMMUNITY): Payer: Self-pay | Admitting: Emergency Medicine

## 2023-01-28 LAB — ACID FAST SMEAR (AFB, MYCOBACTERIA): Acid Fast Smear: NEGATIVE

## 2023-01-28 LAB — CULTURE, BAL-QUANTITATIVE W GRAM STAIN: Culture: NO GROWTH

## 2023-01-28 LAB — ANAEROBIC CULTURE W GRAM STAIN: Gram Stain: NONE SEEN

## 2023-01-31 LAB — ANAEROBIC CULTURE W GRAM STAIN

## 2023-02-02 ENCOUNTER — Telehealth: Payer: Self-pay | Admitting: Emergency Medicine

## 2023-02-02 DIAGNOSIS — C3491 Malignant neoplasm of unspecified part of right bronchus or lung: Secondary | ICD-10-CM

## 2023-02-02 LAB — FUNGUS CULTURE WITH STAIN

## 2023-02-02 LAB — CYTOLOGY - NON PAP

## 2023-02-02 NOTE — Telephone Encounter (Signed)
Waiting for cytology results from last week's bronchoscopy to result.  Will discuss with him when available.

## 2023-02-04 NOTE — Telephone Encounter (Signed)
Part of his cytology has been resulted.  The right upper lobe pulmonary nodule showed adenocarcinoma.  No results yet from the left-sided nodules.  I called him to discuss the results so far.  He has a follow-up visit in our office this Friday.  I will make a referral for him to see thoracic oncology so we can plan staging and treatment options depending on how the final biopsies result.

## 2023-02-05 ENCOUNTER — Inpatient Hospital Stay: Payer: PPO | Admitting: Nurse Practitioner

## 2023-02-05 ENCOUNTER — Telehealth: Payer: Self-pay | Admitting: Emergency Medicine

## 2023-02-05 NOTE — Telephone Encounter (Signed)
I spoke with pathology today 5/2.  The right upper lobe nodule was diagnostic, adenocarcinoma.  The left upper lobe and lingular nodules have not been officially reported out, but the left upper lobe nodule showed atypical cells (no further staining could be done on that sample).  The lingular nodule was suspicious for malignancy.   Suspect based on this that he has bilateral disease, probably at least 2 synchronous primaries, question 3.  He has follow-up in our office tomorrow to discuss.

## 2023-02-05 NOTE — Progress Notes (Deleted)
@Patient  ID: Richard Delacruz, male    DOB: [birth year], 72 y.o.   MRN: [id number]  No chief complaint on file.   Referring provider: Sigmund Hazel, MD  HPI: 71 year old male, never smoker followed for adenocarcinoma of right lung.   TEST/EVENTS:   Allergies  Allergen Reactions   Atacand [Candesartan] Anaphylaxis   Amlodipine     Swelling, joint and muscle pain    Monopril [Fosinopril] Cough   Naproxen Other (See Comments)    Abdominal pain    Niaspan [Niacin Er] Rash    Immunization History  Administered Date(s) Administered   PFIZER(Purple Top)SARS-COV-2 Vaccination 11/18/2019, 12/13/2019    Past Medical History:  Diagnosis Date   Anxiety    Arthritis    Cancer (HCC)    Prostate and melanoma   Depression    DVT (deep venous thrombosis) (HCC)    right lower leg   GERD (gastroesophageal reflux disease)    Hypertension     Tobacco History: Social History   Tobacco Use  Smoking Status Never   Passive exposure: Past  Smokeless Tobacco Never   Counseling given: Not Answered   Outpatient Medications Prior to Visit  Medication Sig Dispense Refill   acetaminophen (TYLENOL) 500 MG tablet Take 1,000 mg by mouth every 6 (six) hours as needed for moderate pain.     atorvastatin (LIPITOR) 40 MG tablet Take 40 mg by mouth daily.     buPROPion (WELLBUTRIN SR) 150 MG 12 hr tablet Take 150 mg by mouth 3 (three) times daily.     Calcium Citrate-Vitamin D (CALCIUM + D PO) Take 2 tablets by mouth daily.     clonazePAM (KLONOPIN) 1 MG tablet Take 1 mg by mouth 2 (two) times daily.      diphenhydramine-acetaminophen (TYLENOL PM) 25-500 MG TABS tablet Take 2 tablets by mouth at bedtime.     lamoTRIgine (LAMICTAL) 100 MG tablet Take 100 mg by mouth 2 (two) times daily.     metoprolol succinate (TOPROL-XL) 100 MG 24 hr tablet Take 100 mg by mouth daily. Take with or immediately following a meal.     Multiple Vitamin (MULTIVITAMIN WITH MINERALS) TABS tablet Take 1 tablet by  mouth daily.     rivaroxaban (XARELTO) 10 MG TABS tablet Take 1 tablet (10 mg total) by mouth daily. Okay to restart this medication on 01/27/2023 30 tablet    rivaroxaban (XARELTO) 10 MG TABS tablet Take 1 tablet (10 mg total) by mouth daily. 30 tablet 0   No facility-administered medications prior to visit.     Review of Systems:   Constitutional: No weight loss or gain, night sweats, fevers, chills, fatigue, or lassitude. HEENT: No headaches, difficulty swallowing, tooth/dental problems, or sore throat. No sneezing, itching, ear ache, nasal congestion, or post nasal drip CV:  No chest pain, orthopnea, PND, swelling in lower extremities, anasarca, dizziness, palpitations, syncope Resp: No shortness of breath with exertion or at rest. No excess mucus or change in color of mucus. No productive or non-productive. No hemoptysis. No wheezing.  No chest wall deformity GI:  No heartburn, indigestion, abdominal pain, nausea, vomiting, diarrhea, change in bowel habits, loss of appetite, bloody stools.  GU: No dysuria, change in color of urine, urgency or frequency.  No flank pain, no hematuria  Skin: No rash, lesions, ulcerations MSK:  No joint pain or swelling.  No decreased range of motion.  No back pain. Neuro: No dizziness or lightheadedness.  Psych: No depression or anxiety. Mood stable.  Physical Exam:  There were no vitals taken for this visit.  GEN: Pleasant, interactive, well-nourished/chronically-ill appearing/acutely-ill appearing/poorly-nourished/morbidly obese; in no acute distress.****** HEENT:  Normocephalic and atraumatic. EACs patent bilaterally. TM pearly gray with present light reflex bilaterally. PERRLA. Sclera white. Nasal turbinates pink, moist and patent bilaterally. No rhinorrhea present. Oropharynx pink and moist, without exudate or edema. No lesions, ulcerations, or postnasal drip.  NECK:  Supple w/ fair ROM. No JVD present. Normal carotid impulses w/o bruits.  Thyroid symmetrical with no goiter or nodules palpated. No lymphadenopathy.   CV: RRR, no m/r/g, no peripheral edema. Pulses intact, +2 bilaterally. No cyanosis, pallor or clubbing. PULMONARY:  Unlabored, regular breathing. Clear bilaterally A&P w/o wheezes/rales/rhonchi. No accessory muscle use.  GI: BS present and normoactive. Soft, non-tender to palpation. No organomegaly or masses detected. No CVA tenderness. MSK: No erythema, warmth or tenderness. Cap refil <2 sec all extrem. No deformities or joint swelling noted.  Neuro: A/Ox3. No focal deficits noted.   Skin: Warm, no lesions or rashe Psych: Normal affect and behavior. Judgement and thought content appropriate.     Lab Results:  CBC    Component Value Date/Time   WBC 6.8 12/30/2022 1520   RBC 5.14 12/30/2022 1520   HGB 16.1 12/30/2022 1520   HCT 49.8 12/30/2022 1520   PLT 203 12/30/2022 1520   MCV 96.9 12/30/2022 1520   MCH 31.3 12/30/2022 1520   MCHC 32.3 12/30/2022 1520   RDW 12.3 12/30/2022 1520   LYMPHSABS 2.1 12/30/2022 1520   MONOABS 0.6 12/30/2022 1520   EOSABS 0.1 12/30/2022 1520   BASOSABS 0.1 12/30/2022 1520    BMET    Component Value Date/Time   NA 141 12/30/2022 1520   K 4.4 12/30/2022 1520   CL 103 12/30/2022 1520   CO2 24 12/30/2022 1520   GLUCOSE 90 12/30/2022 1520   BUN 16 12/30/2022 1520   CREATININE 1.17 12/30/2022 1520   CALCIUM 9.6 12/30/2022 1520   GFRNONAA >60 12/30/2022 1520   GFRAA 70 (L) 06/08/2014 1653    BNP No results found for: "BNP"   Imaging:  DG CHEST PORT 1 VIEW  Result Date: 01/27/2023 CLINICAL DATA:  Chest tube removal, prior pneumothorax EXAM: PORTABLE CHEST 1 VIEW COMPARISON:  /23/24 at 12:52 p.m. FINDINGS: The pigtail left pleural drainage catheter has been removed. No pneumothorax. Persistent bandlike density in the left mid lung probably from atelectasis. There is also some mild atelectasis or scarring medially at the right lung base. Low lung volumes are present,  causing crowding of the pulmonary vasculature. Atherosclerotic calcification of the aortic arch. Thoracic spondylosis. Degenerative left glenohumeral arthropathy with suspected free osteochondral fragments in the left subscapular recess. The lung nodule seen on recent chest CT are much more conspicuous on that exam. IMPRESSION: 1. Interval removal of the left pleural drainage catheter, without pneumothorax. 2. Persistent bandlike density in the left mid lung probably from atelectasis. 3. Degenerative left glenohumeral arthropathy with suspected free osteochondral fragments in the left subscapular recess. 4. Thoracic spondylosis. 5. Atherosclerotic calcification of the aortic arch. 6. Known lung nodules are more conspicuous on prior chest CT. Electronically Signed   By: Gaylyn Rong M.D.   On: 01/27/2023 17:21   DG CHEST PORT 1 VIEW  Result Date: 01/27/2023 CLINICAL DATA:  Left chest tube follow-up. EXAM: PORTABLE CHEST 1 VIEW COMPARISON:  Earlier today at 6:35 a.m. FINDINGS: 12:56 p.m. Left pleural pigtail catheter is slightly more peripherally positioned on the current exam. Patient rotated minimally left. Midline  trachea. Cardiomegaly accentuated by AP portable technique. Atherosclerosis in the transverse aorta. Mild right hemidiaphragm elevation. No pleural effusion or pneumothorax. Persistent left perihilar and bibasilar atelectasis. IMPRESSION: No pneumothorax with left-sided pigtail pleural catheter remaining in place. Aortic Atherosclerosis (ICD10-I70.0). Electronically Signed   By: Jeronimo Greaves M.D.   On: 01/27/2023 13:41   DG CHEST PORT 1 VIEW  Result Date: 01/27/2023 CLINICAL DATA:  Pneumothorax EXAM: PORTABLE CHEST 1 VIEW COMPARISON:  X-ray 01/26/2023 and older FINDINGS: Left pigtail catheter again seen. The pigtail is incompletely formed on the current x-ray, unchanged from previous. Decreasing left lung base opacity. No pneumothorax on the current x-ray. No edema or effusion. Stable  cardiopericardial silhouette. Degenerative changes of the spine IMPRESSION: Changing configuration of the left pigtail catheter with the pigtail incompletely formed on the current x-ray. No left-sided pneumothorax. Decreasing left lung base opacity Electronically Signed   By: Karen Kays M.D.   On: 01/27/2023 10:23   DG CHEST PORT 1 VIEW  Result Date: 01/26/2023 CLINICAL DATA:  Left pneumothorax EXAM: PORTABLE CHEST 1 VIEW COMPARISON:  Previous studies including the examination done earlier today FINDINGS: There is interval resolution of large left pneumothorax after placement of left chest tube. There is a pigtail left chest tube with its tip in the left parahilar region. There is partial re-expansion of left lung. Residual linear densities are seen in left mid and lower lung fields. Right lung is clear. Left lateral CP angle is indistinct. IMPRESSION: There is interval resolution of large left pneumothorax after placement of left chest tube. There is significant interval improvement in aeration in left lung with residual subsegmental atelectasis in left mid and left lower lung fields. Left lateral CP angle is indistinct which maybe due to small effusion or small infiltrate in the adjacent lung. Electronically Signed   By: Ernie Avena M.D.   On: 01/26/2023 15:42   DG Chest Port 1 View  Result Date: 01/26/2023 CLINICAL DATA:  Post bronchoscopy and biopsy EXAM: PORTABLE CHEST 1 VIEW COMPARISON:  Portable exam 1401 hours compared to 11/19/2021 FINDINGS: Normal heart size mediastinal contours. Large LEFT pneumothorax with subtotal collapse of LEFT lung. No definite mediastinal shift. Mild atelectasis mid to lower RIGHT lung. No pleural effusion or RIGHT pneumothorax. IMPRESSION: Large LEFT pneumothorax with subtotal collapse of LEFT lung. No definite mediastinal shift/evidence of tension. Critical Value/emergent results were called by telephone at the time of interpretation on 01/26/2023 at 1430 hours  to provider Levy Pupa MD, who verbally acknowledged these results. Electronically Signed   By: Ulyses Southward M.D.   On: 01/26/2023 14:31   DG C-ARM BRONCHOSCOPY  Result Date: 01/26/2023 C-ARM BRONCHOSCOPY: Fluoroscopy was utilized by the requesting physician.  No radiographic interpretation.   CT Super D Chest Wo Contrast  Result Date: 01/23/2023 CLINICAL DATA:  History of prostate cancer; * Tracking Code: BO * EXAM: CT CHEST WITHOUT CONTRAST TECHNIQUE: Multidetector CT imaging of the chest was performed using thin slice collimation for electromagnetic bronchoscopy planning purposes, without intravenous contrast. RADIATION DOSE REDUCTION: This exam was performed according to the departmental dose-optimization program which includes automated exposure control, adjustment of the mA and/or kV according to patient size and/or use of iterative reconstruction technique. COMPARISON:  PET-CT dated December 18, 2022 FINDINGS: Cardiovascular: Normal heart size. No pericardial effusion. Normal caliber thoracic aorta with mild calcified plaque. Mild coronary artery calcifications. Mediastinum/Nodes: Esophagus and thyroid are unremarkable. No enlarged lymph nodes seen in the chest. Lungs/Pleura: Central airways are patent. Bilateral solid pulmonary nodules,  are increased in size. Reference solid of the lingula measuring 14 x 9 mm on series 4, image 105, previously 8 mm. Reference solid pulmonary nodule of the right lung apex measuring 9 x 7 mm on image 45, previously 3 mm. No pleural effusion. Upper Abdomen: No acute abnormality. Musculoskeletal: No aggressive appearing osseous lesions. IMPRESSION: 1. Bilateral solid pulmonary nodules are increased in size, concerning for progressive metastatic disease. 2. Coronary artery calcifications and aortic Atherosclerosis (ICD10-I70.0). Electronically Signed   By: Allegra Lai M.D.   On: 01/23/2023 08:45          No data to display          No results found for:  "NITRICOXIDE"      Assessment & Plan:   No problem-specific Assessment & Plan notes found for this encounter.   I spent *** minutes of dedicated to the care of this patient on the date of this encounter to include pre-visit review of records, face-to-face time with the patient discussing conditions above, post visit ordering of testing, clinical documentation with the electronic health record, making appropriate referrals as documented, and communicating necessary findings to members of the patients care team.  Noemi Chapel, NP 02/05/2023  Pt aware and understands NP's role.

## 2023-02-06 ENCOUNTER — Ambulatory Visit: Payer: PPO | Admitting: Nurse Practitioner

## 2023-02-06 ENCOUNTER — Ambulatory Visit (INDEPENDENT_AMBULATORY_CARE_PROVIDER_SITE_OTHER): Payer: PPO

## 2023-02-06 ENCOUNTER — Encounter: Payer: Self-pay | Admitting: Nurse Practitioner

## 2023-02-06 VITALS — BP 146/84 | HR 53 | Temp 98.2°F | Ht 69.0 in | Wt 225.4 lb

## 2023-02-06 DIAGNOSIS — J95811 Postprocedural pneumothorax: Secondary | ICD-10-CM

## 2023-02-06 DIAGNOSIS — C3491 Malignant neoplasm of unspecified part of right bronchus or lung: Secondary | ICD-10-CM

## 2023-02-06 DIAGNOSIS — F4321 Adjustment disorder with depressed mood: Secondary | ICD-10-CM | POA: Diagnosis not present

## 2023-02-06 DIAGNOSIS — Z0389 Encounter for observation for other suspected diseases and conditions ruled out: Secondary | ICD-10-CM | POA: Diagnosis not present

## 2023-02-06 DIAGNOSIS — C3490 Malignant neoplasm of unspecified part of unspecified bronchus or lung: Secondary | ICD-10-CM

## 2023-02-06 NOTE — Progress Notes (Signed)
@Patient  ID: Richard Delacruz, male    DOB: 07-18-1951, 72 y.o.   MRN: [id number]  Chief Complaint  Patient presents with   Follow-up    F/u after bronch.     Referring provider: Sigmund Hazel, MD  HPI: 72 year old male, never smoker followed for adenocarcinoma of the right lung. He is a patient of Dr. Kavin Leech and last seen in office 01/20/2023. Past medical history significant for prostate cancer, malignant melanoma, HLD, bipolar, migraines, hypertension, hx of DVT on Xarelto.   TEST/EVENTS:  12/18/2022 PET PSMA: No signs of PSMA avid disease in the neck, chest, abdomen or pelvis.  Pulmonary nodules, new and enlarging but not PSMA avid. 01/23/2023 super D CT chest: Central airways are patent.  Bilateral solid pulmonary nodules, increased in size.  Lingula measuring 14 x 9 mm, previously 8 mm.  Solid nodule of the right apex measuring 9 x 7 mm, previously 3 mm.  01/20/2023: OV with Dr. Delton Coombes for initial pulmonary consult.  History of pulmonary nodular disease going back to imaging from February 2019.  Deemed stable in size and appearance.  He had a rising PSA which prompted a repeat PET scan done on 12/18/2022 with enlargement in pulmonary nodules noted that were not PSMA hypermetabolic, and consistent with metastatic prostate cancer.  Recommendation was to move forward with tissue diagnosis.  Robotic assisted navigational bronchoscopy was scheduled.  02/06/2023: Today-follow-up Patient presents today for follow-up after bronchoscopy 01/26/2023 complicated by postprocedural left pneumothorax requiring chest tube and admission.  He clinically improved and was discharged on 01/27/2023.  Cytology showed adenocarcinoma of the right upper lobe pulmonary nodule.  Left upper lobe and lingular nodules have not been officially reported out but the left upper lobe nodule showed atypical cells and the lingular nodule was suspicious for malignancy.  Findings are concerning for probably at least 2 synchronous  primaries, possibly 3. Today, he tells me that his breathing has been doing well since he was discharged.  He has not been having any shortness of breath, chest discomfort/tightness, wheezing or cough.  No hemoptysis.  He has had some weight loss since he was here last.  Relates it to decreased appetite due to life stressors.  He unfortunately lost his wife last week and is understandably going through a difficult grieving process.  No SI/HI.  Here today to discuss next steps regarding his bronchoscopy findings.  He was already referred to medical oncology but does not have a upcoming appointment scheduled yet.    Allergies  Allergen Reactions   Atacand [Candesartan] Anaphylaxis   Amlodipine     Swelling, joint and muscle pain    Monopril [Fosinopril] Cough   Naproxen Other (See Comments)    Abdominal pain    Niaspan [Niacin Er] Rash    Immunization History  Administered Date(s) Administered   PFIZER(Purple Top)SARS-COV-2 Vaccination 11/18/2019, 12/13/2019    Past Medical History:  Diagnosis Date   Anxiety    Arthritis    Cancer (HCC)    Prostate and melanoma   Depression    DVT (deep venous thrombosis) (HCC)    right lower leg   GERD (gastroesophageal reflux disease)    Hypertension     Tobacco History: Social History   Tobacco Use  Smoking Status Never   Passive exposure: Past  Smokeless Tobacco Never   Counseling given: Not Answered   Outpatient Medications Prior to Visit  Medication Sig Dispense Refill   acetaminophen (TYLENOL) 500 MG tablet Take 1,000 mg by mouth  every 6 (six) hours as needed for moderate pain.     atorvastatin (LIPITOR) 40 MG tablet Take 40 mg by mouth daily.     buPROPion (WELLBUTRIN SR) 150 MG 12 hr tablet Take 150 mg by mouth 3 (three) times daily.     Calcium Citrate-Vitamin D (CALCIUM + D PO) Take 2 tablets by mouth daily.     clonazePAM (KLONOPIN) 1 MG tablet Take 1 mg by mouth 2 (two) times daily.      diphenhydramine-acetaminophen  (TYLENOL PM) 25-500 MG TABS tablet Take 2 tablets by mouth at bedtime.     lamoTRIgine (LAMICTAL) 100 MG tablet Take 100 mg by mouth 2 (two) times daily.     metoprolol succinate (TOPROL-XL) 100 MG 24 hr tablet Take 100 mg by mouth daily. Take with or immediately following a meal.     Multiple Vitamin (MULTIVITAMIN WITH MINERALS) TABS tablet Take 1 tablet by mouth daily.     rivaroxaban (XARELTO) 10 MG TABS tablet Take 1 tablet (10 mg total) by mouth daily. Okay to restart this medication on 01/27/2023 30 tablet    rivaroxaban (XARELTO) 10 MG TABS tablet Take 1 tablet (10 mg total) by mouth daily. 30 tablet 0   No facility-administered medications prior to visit.     Review of Systems:   Constitutional: No night sweats, fevers, chills, fatigue, or lassitude. + Weight loss HEENT: No headaches, difficulty swallowing, tooth/dental problems, or sore throat. No sneezing, itching, ear ache, nasal congestion, or post nasal drip CV:  No chest pain, orthopnea, PND, swelling in lower extremities, anasarca, dizziness, palpitations, syncope Resp: No shortness of breath with exertion or at rest. No excess mucus or change in color of mucus. No productive or non-productive. No hemoptysis. No wheezing.  No chest wall deformity GI:  No heartburn, indigestion, abdominal pain, nausea, vomiting, diarrhea, change in bowel habits, loss of appetite, bloody stools.  GU: No dysuria, change in color of urine, urgency or frequency.  No flank pain, no hematuria  Skin: No rash, lesions, ulcerations MSK:  No joint pain or swelling.  No decreased range of motion.  No back pain. Neuro: No dizziness or lightheadedness.  Psych: + Grief.  No SI/HI. mood stable.     Physical Exam:  BP (!) 146/84   Pulse (!) 53   Temp 98.2 F (36.8 C) (Oral)   Ht 5\' 9"  (1.753 m)   Wt 225 lb 6.4 oz (102.2 kg)   SpO2 99%   BMI 33.29 kg/m   GEN: Pleasant, interactive, well-kempt; obese; in no acute distress. HEENT:  Normocephalic and  atraumatic. PERRLA. Sclera white. Nasal turbinates pink, moist and patent bilaterally. No rhinorrhea present. Oropharynx pink and moist, without exudate or edema. No lesions, ulcerations, or postnasal drip.  NECK:  Supple w/ fair ROM. No JVD present. Normal carotid impulses w/o bruits. Thyroid symmetrical with no goiter or nodules palpated. No lymphadenopathy.   CV: RRR, no m/r/g, no peripheral edema. Pulses intact, +2 bilaterally. No cyanosis, pallor or clubbing. PULMONARY:  Unlabored, regular breathing. Clear bilaterally A&P w/o wheezes/rales/rhonchi. No accessory muscle use.  GI: BS present and normoactive. Soft, non-tender to palpation. No organomegaly or masses detected.  MSK: No erythema, warmth or tenderness. Cap refil <2 sec all extrem. No deformities or joint swelling noted.  Neuro: A/Ox3. No focal deficits noted.   Skin: Warm, no lesions or rashe Psych: Normal affect and behavior. Judgement and thought content appropriate.     Lab Results:  CBC    Component  Value Date/Time   WBC 6.8 12/30/2022 1520   RBC 5.14 12/30/2022 1520   HGB 16.1 12/30/2022 1520   HCT 49.8 12/30/2022 1520   PLT 203 12/30/2022 1520   MCV 96.9 12/30/2022 1520   MCH 31.3 12/30/2022 1520   MCHC 32.3 12/30/2022 1520   RDW 12.3 12/30/2022 1520   LYMPHSABS 2.1 12/30/2022 1520   MONOABS 0.6 12/30/2022 1520   EOSABS 0.1 12/30/2022 1520   BASOSABS 0.1 12/30/2022 1520    BMET    Component Value Date/Time   NA 141 12/30/2022 1520   K 4.4 12/30/2022 1520   CL 103 12/30/2022 1520   CO2 24 12/30/2022 1520   GLUCOSE 90 12/30/2022 1520   BUN 16 12/30/2022 1520   CREATININE 1.17 12/30/2022 1520   CALCIUM 9.6 12/30/2022 1520   GFRNONAA >60 12/30/2022 1520   GFRAA 70 (L) 06/08/2014 1653    BNP No results found for: "BNP"   Imaging:  DG CHEST PORT 1 VIEW  Result Date: 01/27/2023 CLINICAL DATA:  Chest tube removal, prior pneumothorax EXAM: PORTABLE CHEST 1 VIEW COMPARISON:  /23/24 at 12:52 p.m.  FINDINGS: The pigtail left pleural drainage catheter has been removed. No pneumothorax. Persistent bandlike density in the left mid lung probably from atelectasis. There is also some mild atelectasis or scarring medially at the right lung base. Low lung volumes are present, causing crowding of the pulmonary vasculature. Atherosclerotic calcification of the aortic arch. Thoracic spondylosis. Degenerative left glenohumeral arthropathy with suspected free osteochondral fragments in the left subscapular recess. The lung nodule seen on recent chest CT are much more conspicuous on that exam. IMPRESSION: 1. Interval removal of the left pleural drainage catheter, without pneumothorax. 2. Persistent bandlike density in the left mid lung probably from atelectasis. 3. Degenerative left glenohumeral arthropathy with suspected free osteochondral fragments in the left subscapular recess. 4. Thoracic spondylosis. 5. Atherosclerotic calcification of the aortic arch. 6. Known lung nodules are more conspicuous on prior chest CT. Electronically Signed   By: Gaylyn Rong M.D.   On: 01/27/2023 17:21   DG CHEST PORT 1 VIEW  Result Date: 01/27/2023 CLINICAL DATA:  Left chest tube follow-up. EXAM: PORTABLE CHEST 1 VIEW COMPARISON:  Earlier today at 6:35 a.m. FINDINGS: 12:56 p.m. Left pleural pigtail catheter is slightly more peripherally positioned on the current exam. Patient rotated minimally left. Midline trachea. Cardiomegaly accentuated by AP portable technique. Atherosclerosis in the transverse aorta. Mild right hemidiaphragm elevation. No pleural effusion or pneumothorax. Persistent left perihilar and bibasilar atelectasis. IMPRESSION: No pneumothorax with left-sided pigtail pleural catheter remaining in place. Aortic Atherosclerosis (ICD10-I70.0). Electronically Signed   By: Jeronimo Greaves M.D.   On: 01/27/2023 13:41   DG CHEST PORT 1 VIEW  Result Date: 01/27/2023 CLINICAL DATA:  Pneumothorax EXAM: PORTABLE CHEST 1 VIEW  COMPARISON:  X-ray 01/26/2023 and older FINDINGS: Left pigtail catheter again seen. The pigtail is incompletely formed on the current x-ray, unchanged from previous. Decreasing left lung base opacity. No pneumothorax on the current x-ray. No edema or effusion. Stable cardiopericardial silhouette. Degenerative changes of the spine IMPRESSION: Changing configuration of the left pigtail catheter with the pigtail incompletely formed on the current x-ray. No left-sided pneumothorax. Decreasing left lung base opacity Electronically Signed   By: Karen Kays M.D.   On: 01/27/2023 10:23   DG CHEST PORT 1 VIEW  Result Date: 01/26/2023 CLINICAL DATA:  Left pneumothorax EXAM: PORTABLE CHEST 1 VIEW COMPARISON:  Previous studies including the examination done earlier today FINDINGS: There is interval  resolution of large left pneumothorax after placement of left chest tube. There is a pigtail left chest tube with its tip in the left parahilar region. There is partial re-expansion of left lung. Residual linear densities are seen in left mid and lower lung fields. Right lung is clear. Left lateral CP angle is indistinct. IMPRESSION: There is interval resolution of large left pneumothorax after placement of left chest tube. There is significant interval improvement in aeration in left lung with residual subsegmental atelectasis in left mid and left lower lung fields. Left lateral CP angle is indistinct which maybe due to small effusion or small infiltrate in the adjacent lung. Electronically Signed   By: Ernie Avena M.D.   On: 01/26/2023 15:42   DG Chest Port 1 View  Result Date: 01/26/2023 CLINICAL DATA:  Post bronchoscopy and biopsy EXAM: PORTABLE CHEST 1 VIEW COMPARISON:  Portable exam 1401 hours compared to 11/19/2021 FINDINGS: Normal heart size mediastinal contours. Large LEFT pneumothorax with subtotal collapse of LEFT lung. No definite mediastinal shift. Mild atelectasis mid to lower RIGHT lung. No pleural  effusion or RIGHT pneumothorax. IMPRESSION: Large LEFT pneumothorax with subtotal collapse of LEFT lung. No definite mediastinal shift/evidence of tension. Critical Value/emergent results were called by telephone at the time of interpretation on 01/26/2023 at 1430 hours to provider Levy Pupa MD, who verbally acknowledged these results. Electronically Signed   By: Ulyses Southward M.D.   On: 01/26/2023 14:31   DG C-ARM BRONCHOSCOPY  Result Date: 01/26/2023 C-ARM BRONCHOSCOPY: Fluoroscopy was utilized by the requesting physician.  No radiographic interpretation.   CT Super D Chest Wo Contrast  Result Date: 01/23/2023 CLINICAL DATA:  History of prostate cancer; * Tracking Code: BO * EXAM: CT CHEST WITHOUT CONTRAST TECHNIQUE: Multidetector CT imaging of the chest was performed using thin slice collimation for electromagnetic bronchoscopy planning purposes, without intravenous contrast. RADIATION DOSE REDUCTION: This exam was performed according to the departmental dose-optimization program which includes automated exposure control, adjustment of the mA and/or kV according to patient size and/or use of iterative reconstruction technique. COMPARISON:  PET-CT dated December 18, 2022 FINDINGS: Cardiovascular: Normal heart size. No pericardial effusion. Normal caliber thoracic aorta with mild calcified plaque. Mild coronary artery calcifications. Mediastinum/Nodes: Esophagus and thyroid are unremarkable. No enlarged lymph nodes seen in the chest. Lungs/Pleura: Central airways are patent. Bilateral solid pulmonary nodules, are increased in size. Reference solid of the lingula measuring 14 x 9 mm on series 4, image 105, previously 8 mm. Reference solid pulmonary nodule of the right lung apex measuring 9 x 7 mm on image 45, previously 3 mm. No pleural effusion. Upper Abdomen: No acute abnormality. Musculoskeletal: No aggressive appearing osseous lesions. IMPRESSION: 1. Bilateral solid pulmonary nodules are increased in size,  concerning for progressive metastatic disease. 2. Coronary artery calcifications and aortic Atherosclerosis (ICD10-I70.0). Electronically Signed   By: Allegra Lai M.D.   On: 01/23/2023 08:45          No data to display          No results found for: "NITRICOXIDE"      Assessment & Plan:   Adenocarcinoma of right lung (HCC) Possibly 2 or 3 synchronous primary lung cancers.  Needs standard PET scan and MRI of the brain for staging.  Orders placed today.  Advised him that if he has not heard anything from medical oncology by Monday, to let us know so we can follow-up on referral. He does have weight loss but I think this is more so  situational than related to malignancy. Discussed diet recommendations today.  Patient Instructions  Your biopsies revealed adenocarcinoma of the right lung and cells suspicious for carcinoma (cancer) of the left lung. Possible these are two separate primary cancers. Attend PET scan for staging and MRI of the brain. Referral was previously placed to oncology. Let me know if you haven't heard from them by Monday.   Grief counseling with [facility] - [phone] They have a lot of great programs that I hope will help you through this time   Let us know if we can do anything for you  Try to work on high protein supplements and frequent small meals to avoid further weight loss.  Chest x ray today  Follow up in 3 months with Dr. Delton Coombes or Philis Nettle, or sooner if needed   Grief Related to loss of his wife. He has a strong emotional support system with friends. No local family. Provided with grief counseling information through [employer]. Encouraged him to reach out to [place] or his PCP if he feels like his mood worsens or he is struggling.   I spent 35 minutes of dedicated to the care of this patient on the date of this encounter to include pre-visit review of records, face-to-face time with the patient discussing conditions above, post visit  ordering of testing, clinical documentation with the electronic health record, making appropriate referrals as documented, and communicating necessary findings to members of the patients care team.  Noemi Chapel, NP 02/06/2023  Pt aware and understands NP's role.

## 2023-02-06 NOTE — Assessment & Plan Note (Signed)
Possibly 2 or 3 synchronous primary lung cancers.  Needs standard PET scan and MRI of the brain for staging.  Orders placed today.  Advised him that if he has not heard anything from medical oncology by Monday, to let us know so we can follow-up on referral. He does have weight loss but I think this is more so situational than related to malignancy. Discussed diet recommendations today.  Patient Instructions  Your biopsies revealed adenocarcinoma of the right lung and cells suspicious for carcinoma (cancer) of the left lung. Possible these are two separate primary cancers. Attend PET scan for staging and MRI of the brain. Referral was previously placed to oncology. Let me know if you haven't heard from them by Monday.   Grief counseling with [facility] - [phone] They have a lot of great programs that I hope will help you through this time   Let us know if we can do anything for you  Try to work on high protein supplements and frequent small meals to avoid further weight loss.  Chest x ray today  Follow up in 3 months with Dr. Delton Coombes or Philis Nettle, or sooner if needed

## 2023-02-06 NOTE — Patient Instructions (Addendum)
Your biopsies revealed adenocarcinoma of the right lung and cells suspicious for carcinoma (cancer) of the left lung. Possible these are two separate primary cancers. Attend PET scan for staging and MRI of the brain. Referral was previously placed to oncology. Let me know if you haven't heard from them by Monday.   Grief counseling with [facility] - [phone] They have a lot of great programs that I hope will help you through this time   Let us know if we can do anything for you  Try to work on high protein supplements and frequent small meals to avoid further weight loss.  Chest x ray today  Follow up in 3 months with Dr. Delton Coombes or Philis Nettle, or sooner if needed

## 2023-02-06 NOTE — Assessment & Plan Note (Signed)
Related to loss of his wife. He has a strong emotional support system with friends. No local family. Provided with grief counseling information through [employer]. Encouraged him to reach out to [place] or his PCP if he feels like his mood worsens or he is struggling.

## 2023-02-11 NOTE — Progress Notes (Signed)
CXR clear. Thanks.

## 2023-02-12 ENCOUNTER — Ambulatory Visit
Admission: RE | Admit: 2023-02-12 | Discharge: 2023-02-12 | Disposition: A | Discharge status: Home / Self Care | Payer: PPO | Source: Ambulatory Visit | Attending: Nurse Practitioner | Admitting: Nurse Practitioner

## 2023-02-12 ENCOUNTER — Other Ambulatory Visit: Payer: Self-pay

## 2023-02-12 DIAGNOSIS — G9389 Other specified disorders of brain: Secondary | ICD-10-CM | POA: Diagnosis not present

## 2023-02-12 DIAGNOSIS — C3490 Malignant neoplasm of unspecified part of unspecified bronchus or lung: Secondary | ICD-10-CM

## 2023-02-12 DIAGNOSIS — G319 Degenerative disease of nervous system, unspecified: Secondary | ICD-10-CM | POA: Diagnosis not present

## 2023-02-12 DIAGNOSIS — Z8546 Personal history of malignant neoplasm of prostate: Secondary | ICD-10-CM | POA: Insufficient documentation

## 2023-02-12 DIAGNOSIS — R911 Solitary pulmonary nodule: Secondary | ICD-10-CM | POA: Diagnosis not present

## 2023-02-12 DIAGNOSIS — C3491 Malignant neoplasm of unspecified part of right bronchus or lung: Secondary | ICD-10-CM

## 2023-02-12 LAB — GLUCOSE, CAPILLARY: Glucose-Capillary: 93 mg/dL (ref 70–99)

## 2023-02-12 MED ORDER — FLUDEOXYGLUCOSE F - 18 (FDG) INJECTION
11.7000 | Freq: Once | INTRAVENOUS | Status: AC | PRN
  Administered 2023-02-12: 12.48 via INTRAVENOUS

## 2023-02-12 MED ORDER — GADOBUTROL 1 MMOL/ML IV SOLN
10.0000 mL | Freq: Once | INTRAVENOUS | Status: AC | PRN
  Administered 2023-02-12: 10 mL via INTRAVENOUS

## 2023-02-12 NOTE — Progress Notes (Signed)
The proposed treatment discussed in conference is for discussion purpose only and is not a binding recommendation.  The patients have not been physically examined, or presented with their treatment options.  Therefore, final treatment plans cannot be decided.  

## 2023-02-13 NOTE — Progress Notes (Signed)
No evidence of mets to the brain, which is great news. He has a small cyst under his scalp which is not concerning and he can follow up with his PCP on if it is bothersome. Thanks!

## 2023-02-16 NOTE — Progress Notes (Signed)
No evidence of metastatic disease throughout the body, which is good news. The nodules in his lungs did light up on the PET scan, consistent with cancer as we already knew based on his biopsy. Attend appt with oncology as scheduled. Thanks.

## 2023-02-17 NOTE — Progress Notes (Signed)
Spoke with pt and notified of results per Katie.  Pt verbalized understanding and denied any questions. 

## 2023-02-18 DIAGNOSIS — Z08 Encounter for follow-up examination after completed treatment for malignant neoplasm: Secondary | ICD-10-CM | POA: Diagnosis not present

## 2023-02-18 DIAGNOSIS — Z85820 Personal history of malignant melanoma of skin: Secondary | ICD-10-CM | POA: Diagnosis not present

## 2023-02-18 DIAGNOSIS — D225 Melanocytic nevi of trunk: Secondary | ICD-10-CM | POA: Diagnosis not present

## 2023-02-18 DIAGNOSIS — Z1283 Encounter for screening for malignant neoplasm of skin: Secondary | ICD-10-CM | POA: Diagnosis not present

## 2023-02-20 NOTE — Progress Notes (Unsigned)
[facility] [phone]   [phone]  CONSULT NOTE  REFERRING PHYSICIAN: Dr. Delton Coombes   REASON FOR CONSULTATION:  Lung Cancer   HPI Richard Delacruz is a 72 y.o. male with a past medical history significant for mood disorder, HLD, prostate cancer (dx in 2011 s/p proton beam therapy and androgen depravation) currently followed by Dr. Marlou Porch, hyperlipidemia, DVT on Xarelto, melanoma (dx 5 years ago s/p excision followed by Dr. Charlton Haws from dermatology), and hypertension is referred to the clinic for lung cancer .   He had a PET scan in 12/2022 due to rising PSA (he believes it was around 17) and history of prostate cancer.  He was found to have enlarging pulmonary nodules compared to prior pet on 7/22. These were not PSMA-PET positive (inconsistent with prostate cancer).   The patient then established care with Dr. Delton Coombes on 01/20/23 to discuss navigational bronchoscopy. He underwent this procedure on 01/26/23. This was complicated by postprocedural left pneumothorax requiring chest tube and admission.  He clinically improved and was discharged on 01/27/2023.    The final pathology of the RUL target was positive for adenocarcinoma. The left upper lobe nodule showed atypical cells and the lingular nodule was suspicious for malignancy. Findings are concerning for probably at least 2 synchronous primaries, possibly 3.   His brain MRI was negative for metastatic disease.   His PET scan on 02/12/23 showed hypermetabolic RUL nodule, and hypermetabolic nodules in the LUL and lingula are concerning for synchronous bronchogenic carcinoma. There was no evidence of metastatic mediastinal adenopathy or distant metastatic disease.   His care was discussed at the multidisciplinary conference who recommended radiation per navigation team.   Overall, the patient states he is feeling "okay".  His wife died 3 weeks ago.  Today he denies any fever, chills, or night sweats.   Denies any chest pain, shortness of breath, cough, or hemoptysis.  Denies any nausea, vomiting, diarrhea, or constipation.  Denies any headache or visual changes.  The patient's family history consist of father who had prostate cancer.  The patient's father had COPD and heart problems.  The patient's brothers had passed away secondary to alcohol related complications.  The patient worked at a [employer].  He is widowed.  He does not have any children.  Denies any prior tobacco use.    HPI  Past Medical History:  Diagnosis Date   Anxiety    Arthritis    Cancer (HCC)    Prostate and melanoma   Depression    DVT (deep venous thrombosis) (HCC)    right lower leg   GERD (gastroesophageal reflux disease)    Hypertension     Past Surgical History:  Procedure Laterality Date   ADENOIDECTOMY     BRONCHIAL BIOPSY  01/26/2023   Procedure: BRONCHIAL BIOPSIES;  Surgeon: Leslye Peer, MD;  Location: [facility] ENDOSCOPY;  Service: Pulmonary;;   BRONCHIAL BRUSHINGS  01/26/2023   Procedure: BRONCHIAL BRUSHINGS;  Surgeon: Leslye Peer, MD;  Location: [facility] ENDOSCOPY;  Service: Pulmonary;;   BRONCHIAL NEEDLE ASPIRATION BIOPSY  01/26/2023   Procedure: BRONCHIAL NEEDLE ASPIRATION BIOPSIES;  Surgeon: Leslye Peer, MD;  Location: [facility] ENDOSCOPY;  Service: Pulmonary;;   COLONOSCOPY     TONSILLECTOMY     VIDEO BRONCHOSCOPY WITH RADIAL ENDOBRONCHIAL ULTRASOUND  01/26/2023   Procedure: VIDEO BRONCHOSCOPY WITH RADIAL ENDOBRONCHIAL ULTRASOUND;  Surgeon: Leslye Peer, MD;  Location: [facility] ENDOSCOPY;  Service: Pulmonary;;    No family history on file.  Social History Social History   Tobacco Use   Smoking status: Never    Passive exposure: Past   Smokeless tobacco: Never  Vaping Use   Vaping Use: Never used  Substance Use Topics   Alcohol use: No   Drug use: No    Allergies  Allergen Reactions   Atacand [Candesartan] Anaphylaxis   Amlodipine     Swelling, joint and muscle pain    Monopril  [Fosinopril] Cough   Naproxen Other (See Comments)    Abdominal pain    Niaspan [Niacin Er] Rash    Current Outpatient Medications  Medication Sig Dispense Refill   acetaminophen (TYLENOL) 500 MG tablet Take 1,000 mg by mouth every 6 (six) hours as needed for moderate pain.     atorvastatin (LIPITOR) 40 MG tablet Take 40 mg by mouth daily.     buPROPion (WELLBUTRIN SR) 150 MG 12 hr tablet Take 150 mg by mouth 3 (three) times daily.     Calcium Citrate-Vitamin D (CALCIUM + D PO) Take 2 tablets by mouth daily.     clonazePAM (KLONOPIN) 1 MG tablet Take 1 mg by mouth 2 (two) times daily.      diphenhydramine-acetaminophen (TYLENOL PM) 25-500 MG TABS tablet Take 2 tablets by mouth at bedtime.     lamoTRIgine (LAMICTAL) 100 MG tablet Take 100 mg by mouth 2 (two) times daily.     metoprolol succinate (TOPROL-XL) 100 MG 24 hr tablet Take 100 mg by mouth daily. Take with or immediately following a meal.     Multiple Vitamin (MULTIVITAMIN WITH MINERALS) TABS tablet Take 1 tablet by mouth daily.     rivaroxaban (XARELTO) 10 MG TABS tablet Take 1 tablet (10 mg total) by mouth daily. 30 tablet 0   No current facility-administered medications for this visit.    REVIEW OF SYSTEMS:   Review of Systems  Constitutional: Negative for appetite change, chills, fatigue, fever and unexpected weight change.  HENT:   Negative for mouth sores, nosebleeds, sore throat and trouble swallowing.   Eyes: Negative for eye problems and icterus.  Respiratory: Negative for cough, hemoptysis, shortness of breath and wheezing.   Cardiovascular: Negative for chest pain and leg swelling.  Gastrointestinal: Negative for abdominal pain, constipation, diarrhea, nausea and vomiting.  Genitourinary: Negative for bladder incontinence, difficulty urinating, dysuria, frequency and hematuria.   Musculoskeletal: Negative for back pain, gait problem, neck pain and neck stiffness.  Skin: Negative for itching and rash.  Neurological:  Negative for dizziness, extremity weakness, gait problem, headaches, light-headedness and seizures.  Hematological: Negative for adenopathy. Does not bruise/bleed easily.  Psychiatric/Behavioral: Negative for confusion, depression and sleep disturbance. The patient is not nervous/anxious.     PHYSICAL EXAMINATION:  Blood pressure (!) 169/92, pulse (!) 52, temperature 98.3 F (36.8 C), temperature source Oral, resp. rate 18, height 5\' 10"  (1.778 m), weight 222 lb 6.4 oz (100.9 kg), SpO2 99 %.  ECOG PERFORMANCE STATUS: 1  Physical Exam  Constitutional: Oriented to person, place, and time and well-developed, well-nourished, and in no distress. HENT:  Head: Normocephalic and atraumatic.  Mouth/Throat: Oropharynx is clear and moist. No oropharyngeal exudate.  Eyes: Conjunctivae are normal. Right eye exhibits no discharge. Left eye exhibits no discharge. No scleral icterus.  Neck: Normal range of motion. Neck supple.  Cardiovascular: Normal rate, regular rhythm, normal heart sounds and intact distal pulses.   Pulmonary/Chest: Effort normal and breath sounds normal. No respiratory distress. No wheezes. No rales.  Abdominal: Soft. Bowel sounds are normal. Exhibits  no distension and no mass. There is no tenderness.  Musculoskeletal: Normal range of motion. Exhibits no edema.  Lymphadenopathy:    No cervical adenopathy.  Neurological: Alert and oriented to person, place, and time. Exhibits normal muscle tone. Gait normal. Coordination normal.  Skin: Skin is warm and dry. No rash noted. Not diaphoretic. No erythema. No pallor.  Psychiatric: Mood, memory and judgment normal.  Vitals reviewed.  LABORATORY DATA: Lab Results  Component Value Date   WBC 5.6 02/23/2023   HGB 15.7 02/23/2023   HCT 47.8 02/23/2023   MCV 95.6 02/23/2023   PLT 170 02/23/2023      Chemistry      Component Value Date/Time   NA 142 02/23/2023 0934   K 3.8 02/23/2023 0934   CL 106 02/23/2023 0934   CO2 28  02/23/2023 0934   BUN 14 02/23/2023 0934   CREATININE 1.15 02/23/2023 0934      Component Value Date/Time   CALCIUM 9.5 02/23/2023 0934   ALKPHOS 65 02/23/2023 0934   AST 16 02/23/2023 0934   ALT 19 02/23/2023 0934   BILITOT 0.5 02/23/2023 0934       RADIOGRAPHIC STUDIES: NM PET Image Initial (PI) Skull Base To Thigh  Result Date: 02/16/2023 CLINICAL DATA:  Initial treatment strategy for pulmonary nodules. History of prostate cancer. Non PSMA avid pulmonary nodules. Concern for bronchogenic carcinoma. EXAM: NUCLEAR MEDICINE PET SKULL BASE TO THIGH TECHNIQUE: 12.48 mCi F-18 FDG was injected intravenously. Full-ring PET imaging was performed from the skull base to thigh after the radiotracer. CT data was obtained and used for attenuation correction and anatomic localization. Fasting blood glucose: 93 mg/dl COMPARISON:  PSMA PET scan 12/18/2022, chest CT 01/23/2023 FINDINGS: Mediastinal blood pool activity: SUV max 3.0 Liver activity: SUV max NA NECK: No hypermetabolic lymph nodes in the neck. Incidental CT findings: None. CHEST: Within the medial aspect of the RIGHT upper lobe, oblong nodule measuring 18 mm by 13 mm (image 49/CT series 4)adn has metabolic activity with SUV max equal 4.7. Smoothly lobulated nodule in the inferior lingula measures 13 mm (image 65) with SUV max equal 2.5. (Misregistration related to breathing) . LEFT upper lobe nodule measuring 11 mm (image 47/4) also has metabolic activity with SUV max equal 3.0 No hypermetabolic mediastinal lymph nodes. Incidental CT findings: None. ABDOMEN/PELVIS: No abnormal hypermetabolic activity within the liver, pancreas, adrenal glands, or spleen. No hypermetabolic lymph nodes in the abdomen or pelvis. Incidental CT findings: Simple fluid attenuation cyst of the LEFT kidney without metabolic activity. several LEFT renal calculi without obstruction. Ureters and bladder normal. SKELETON: No focal hypermetabolic activity to suggest skeletal  metastasis. Incidental CT findings: None. IMPRESSION: 1. Hypermetabolic RIGHT upper lobe pulmonary nodule concerning for bronchogenic carcinoma. 2. Hypermetabolic nodules in the LEFT upper lobe and lingula are concerning for synchronous bronchogenic carcinoma. 3. No evidence metastatic mediastinal adenopathy. 4. No evidence distant metastatic disease. Electronically Signed   By: Genevive Bi M.D.   On: 02/16/2023 11:51   MR BRAIN W WO CONTRAST  Result Date: 02/12/2023 CLINICAL DATA:  Provided history: Adenocarcinoma of right lung. Malignant neoplasm of unspecified part of unspecified bronchus or lung. Non-small cell lung cancer, staging. Metastatic disease evaluation. EXAM: MRI HEAD WITHOUT AND WITH CONTRAST TECHNIQUE: Multiplanar, multiecho pulse sequences of the brain and surrounding structures were obtained without and with intravenous contrast. CONTRAST:  10mL GADAVIST GADOBUTROL 1 MMOL/ML IV SOLN COMPARISON:  Head CT 12/30/2022. FINDINGS: Brain: Mild cerebral atrophy. No cortical encephalomalacia is identified. No significant cerebral  white matter disease for age. There is no acute infarct. No evidence of an intracranial mass. No chronic intracranial blood products. No extra-axial fluid collection. No midline shift. No pathologic intracranial enhancement identified. Vascular: Maintained flow voids within the proximal large arterial vessels. Skull and upper cervical spine: No focal suspicious marrow lesion. Sinuses/Orbits: No mass or acute finding within the imaged orbits. Small mucous retention cysts within the right sphenoid sinus. Other: Nonspecific 12 mm cystic appearing cutaneous/subcutaneous lesion within the left retroauricular scalp, unchanged in size from the prior head CT of 12/30/2022. IMPRESSION: 1.  No evidence of intracranial metastatic disease. 2. Mild cerebral atrophy. 3. Nonspecific 12 mm cystic appearing cutaneous/subcutaneous lesion within the left retroauricular scalp, unchanged in  size from the head CT of 12/30/2022. Direct examination recommended. 4. Small mucous retention cyst within the right sphenoid sinus. Electronically Signed   By: Jackey Loge D.O.   On: 02/12/2023 08:45   DG Chest 2 View  Result Date: 02/10/2023 CLINICAL DATA:  PTX f/u EXAM: CHEST - 2 VIEW COMPARISON:  None available. FINDINGS: The heart size and mediastinal contours are within normal limits. Both lungs are clear. No pneumothorax or pleural effusion. There are thoracic degenerative changes. IMPRESSION: No active cardiopulmonary disease. Electronically Signed   By: Layla Maw M.D.   On: 02/10/2023 21:39   DG CHEST PORT 1 VIEW  Result Date: 01/27/2023 CLINICAL DATA:  Chest tube removal, prior pneumothorax EXAM: PORTABLE CHEST 1 VIEW COMPARISON:  /23/24 at 12:52 p.m. FINDINGS: The pigtail left pleural drainage catheter has been removed. No pneumothorax. Persistent bandlike density in the left mid lung probably from atelectasis. There is also some mild atelectasis or scarring medially at the right lung base. Low lung volumes are present, causing crowding of the pulmonary vasculature. Atherosclerotic calcification of the aortic arch. Thoracic spondylosis. Degenerative left glenohumeral arthropathy with suspected free osteochondral fragments in the left subscapular recess. The lung nodule seen on recent chest CT are much more conspicuous on that exam. IMPRESSION: 1. Interval removal of the left pleural drainage catheter, without pneumothorax. 2. Persistent bandlike density in the left mid lung probably from atelectasis. 3. Degenerative left glenohumeral arthropathy with suspected free osteochondral fragments in the left subscapular recess. 4. Thoracic spondylosis. 5. Atherosclerotic calcification of the aortic arch. 6. Known lung nodules are more conspicuous on prior chest CT. Electronically Signed   By: Gaylyn Rong M.D.   On: 01/27/2023 17:21   DG CHEST PORT 1 VIEW  Result Date: 01/27/2023 CLINICAL  DATA:  Left chest tube follow-up. EXAM: PORTABLE CHEST 1 VIEW COMPARISON:  Earlier today at 6:35 a.m. FINDINGS: 12:56 p.m. Left pleural pigtail catheter is slightly more peripherally positioned on the current exam. Patient rotated minimally left. Midline trachea. Cardiomegaly accentuated by AP portable technique. Atherosclerosis in the transverse aorta. Mild right hemidiaphragm elevation. No pleural effusion or pneumothorax. Persistent left perihilar and bibasilar atelectasis. IMPRESSION: No pneumothorax with left-sided pigtail pleural catheter remaining in place. Aortic Atherosclerosis (ICD10-I70.0). Electronically Signed   By: Jeronimo Greaves M.D.   On: 01/27/2023 13:41   DG CHEST PORT 1 VIEW  Result Date: 01/27/2023 CLINICAL DATA:  Pneumothorax EXAM: PORTABLE CHEST 1 VIEW COMPARISON:  X-ray 01/26/2023 and older FINDINGS: Left pigtail catheter again seen. The pigtail is incompletely formed on the current x-ray, unchanged from previous. Decreasing left lung base opacity. No pneumothorax on the current x-ray. No edema or effusion. Stable cardiopericardial silhouette. Degenerative changes of the spine IMPRESSION: Changing configuration of the left pigtail catheter with the pigtail  incompletely formed on the current x-ray. No left-sided pneumothorax. Decreasing left lung base opacity Electronically Signed   By: Karen Kays M.D.   On: 01/27/2023 10:23   DG CHEST PORT 1 VIEW  Result Date: 01/26/2023 CLINICAL DATA:  Left pneumothorax EXAM: PORTABLE CHEST 1 VIEW COMPARISON:  Previous studies including the examination done earlier today FINDINGS: There is interval resolution of large left pneumothorax after placement of left chest tube. There is a pigtail left chest tube with its tip in the left parahilar region. There is partial re-expansion of left lung. Residual linear densities are seen in left mid and lower lung fields. Right lung is clear. Left lateral CP angle is indistinct. IMPRESSION: There is interval  resolution of large left pneumothorax after placement of left chest tube. There is significant interval improvement in aeration in left lung with residual subsegmental atelectasis in left mid and left lower lung fields. Left lateral CP angle is indistinct which maybe due to small effusion or small infiltrate in the adjacent lung. Electronically Signed   By: Ernie Avena M.D.   On: 01/26/2023 15:42   DG Chest Port 1 View  Result Date: 01/26/2023 CLINICAL DATA:  Post bronchoscopy and biopsy EXAM: PORTABLE CHEST 1 VIEW COMPARISON:  Portable exam 1401 hours compared to 11/19/2021 FINDINGS: Normal heart size mediastinal contours. Large LEFT pneumothorax with subtotal collapse of LEFT lung. No definite mediastinal shift. Mild atelectasis mid to lower RIGHT lung. No pleural effusion or RIGHT pneumothorax. IMPRESSION: Large LEFT pneumothorax with subtotal collapse of LEFT lung. No definite mediastinal shift/evidence of tension. Critical Value/emergent results were called by telephone at the time of interpretation on 01/26/2023 at 1430 hours to provider Levy Pupa MD, who verbally acknowledged these results. Electronically Signed   By: Ulyses Southward M.D.   On: 01/26/2023 14:31   DG C-ARM BRONCHOSCOPY  Result Date: 01/26/2023 C-ARM BRONCHOSCOPY: Fluoroscopy was utilized by the requesting physician.  No radiographic interpretation.    ASSESSMENT: This is a very pleasant 72 year old male with possibly 2-3 synchronous primary lung cancers in the RUL, stage IA and possibly stage IIA in the left due to the LUL and lingula. The final pathology of the RUL lesion showed NSCLC, adenocarcinoma. He was diagnosed on April 2024.   Will request moleculars today if enough material.   The patient had a PET scan which showed all of these lesions were hypermetabolic without any nodal or distant metastases.   The case was discussed at the multidisciplinary conference who recommended radiation.   The patient was seen  with Dr. Arbutus Ped. Dr. Arbutus Ped recommended referral to radiation oncology. Given the multiple lesions in different lobes, unable to surgically remove all of these lesions.   I have placed a referral to radiation oncology .   We will see him back in 4 months for evaluation with a surveillance CT of the chest  Of note, he has history of prostate cancer dx in 2011 status post proton beam therapy and androgen deprivation. He also has history of early stage melanoma s/p resection ~5 years ago. Follows with Dr. Charlton Haws from dermatology and recently had annual skin check last week.   The patient voices understanding of current disease status and treatment options and is in agreement with the current care plan.  All questions were answered. The patient knows to call the clinic with any problems, questions or concerns. We can certainly see the patient much sooner if necessary.  Thank you so much for allowing me to participate in  the care of [facility]. I will continue to follow up the patient with you and assist in his care.  Disclaimer: This note was dictated with voice recognition software. Similar sounding words can inadvertently be transcribed and may not be corrected upon review.   Merve Hotard L Emersyn Kotarski Feb 23, 2023, 11:11 AM  ADDENDUM: Hematology/Oncology Attending: I had a face-to-face encounter with the patient today.  I reviewed his record, lab, scan and recommended his care plan.  This is a very pleasant 72 years old white male with past medical history significant for prostate cancer diagnosed in 2011 status post proton beam therapy and androgen deprivation and currently followed by urology.,  History of dyslipidemia, deep venous thrombosis currently on Xarelto, history of melanoma status post excision, hypertension as well as mood disorder.  The patient was seen by his urologist for routine evaluation and had PSMA PET scan on December 18, 2022 because of rising PSA.  The scan showed  no signs of PSMA avid disease in the neck, chest, abdomen or pelvis but there was pulmonary nodules with maximum SUV value of less than 2.0 that would be atypical for prostate cancer metastasis but could represent primary bronchogenic carcinoma.  The patient was referred to Dr. Delton Coombes and CT super D of the chest on 01/23/2023 showed bilateral solid pulmonary nodules increased in size with a reference solid nodule of the lingula measuring 1.4 x 0.9 cm previously measured 0.8 cm.  The reference solid pulmonary nodule of the right lung apex measured 0.9 x 0.7 cm and previously measured 0.3 cm.  On 01/26/2023 the patient underwent video bronchoscopy with robotic assisted bronchoscopic navigation by Dr. Delton Coombes and the final pathology [id number]) of the right upper lobe fine-needle aspiration was positive for adenocarcinoma.  The lingula lesion and the left upper lobe lesion fine-needle aspiration showed atypical cells suspicious for malignancy. The patient had a PET scan on 02/12/2023 and that showed hypermetabolic right upper lobe pulmonary nodule concerning for bronchogenic carcinoma.  There was also hypermetabolic nodules in the left upper lobe and lingula concerning for synchronous bronchogenic carcinoma but there was no evidence of metastatic mediastinal adenopathy or evidence of distant metastatic disease.  MRI of the brain performed on the same day showed no intracranial metastasis. Dr. Delton Coombes kindly referred the patient to me today for evaluation and recommendation regarding treatment of his condition. I had a lengthy discussion with the patient today about his current condition and treatment options. The patient has synchronous stage Ia (T1b, N0, M0) adenocarcinoma of the right upper lobe as well as suspicious stage IIb (T3, N0, M0) non-small cell lung cancer involving the lingula and left upper lobe diagnosed in April 2024. The patient is not a good candidate for surgical resection because of the bilateral  disease. I discussed with him referral to radiation oncology for consideration of SBRT to the 3 pulmonary nodules.  The patient asked about proton therapy and I explained to him that there is only 1 center in [place] that in the process of installing proton therapy machine but I am not sure if it is working at this point. I will refer him to radiation oncology and they can discuss with him his treatment options. I will see him back for follow-up visit in 4 months for evaluation and repeat CT scan of the chest for restaging of his disease.  He was advised to call immediately if he has any other concerning symptoms in the interval. The total time spent in the appointment  was 60 minutes. Disclaimer: This note was dictated with voice recognition software. Similar sounding words can inadvertently be transcribed and may be missed upon review. Lajuana Matte, MD

## 2023-02-23 ENCOUNTER — Other Ambulatory Visit: Payer: Self-pay | Admitting: Physician Assistant

## 2023-02-23 ENCOUNTER — Telehealth: Payer: Self-pay | Admitting: Radiation Oncology

## 2023-02-23 ENCOUNTER — Inpatient Hospital Stay: Payer: PPO

## 2023-02-23 ENCOUNTER — Inpatient Hospital Stay: Payer: PPO | Attending: Physician Assistant | Admitting: Physician Assistant

## 2023-02-23 VITALS — BP 169/92 | HR 52 | Temp 98.3°F | Resp 18 | Ht 70.0 in | Wt 222.4 lb

## 2023-02-23 DIAGNOSIS — Z8546 Personal history of malignant neoplasm of prostate: Secondary | ICD-10-CM

## 2023-02-23 DIAGNOSIS — C3491 Malignant neoplasm of unspecified part of right bronchus or lung: Secondary | ICD-10-CM

## 2023-02-23 DIAGNOSIS — Z7901 Long term (current) use of anticoagulants: Secondary | ICD-10-CM

## 2023-02-23 DIAGNOSIS — Z85820 Personal history of malignant melanoma of skin: Secondary | ICD-10-CM | POA: Diagnosis not present

## 2023-02-23 DIAGNOSIS — C3411 Malignant neoplasm of upper lobe, right bronchus or lung: Secondary | ICD-10-CM

## 2023-02-23 DIAGNOSIS — Z86718 Personal history of other venous thrombosis and embolism: Secondary | ICD-10-CM

## 2023-02-23 LAB — CMP (CANCER CENTER ONLY)
ALT: 19 U/L (ref 0–44)
AST: 16 U/L (ref 15–41)
Albumin: 4.5 g/dL (ref 3.5–5.0)
Alkaline Phosphatase: 65 U/L (ref 38–126)
Anion gap: 8 (ref 5–15)
BUN: 14 mg/dL (ref 8–23)
CO2: 28 mmol/L (ref 22–32)
Calcium: 9.5 mg/dL (ref 8.9–10.3)
Chloride: 106 mmol/L (ref 98–111)
Creatinine: 1.15 mg/dL (ref 0.61–1.24)
GFR, Estimated: >60 mL/min (ref >60)
Glucose, Bld: 100 mg/dL — ABNORMAL HIGH (ref 70–99)
Potassium: 3.8 mmol/L (ref 3.5–5.1)
Sodium: 142 mmol/L (ref 135–145)
Total Bilirubin: 0.5 mg/dL (ref 0.3–1.2)
Total Protein: 7.2 g/dL (ref 6.5–8.1)

## 2023-02-23 LAB — CBC WITH DIFFERENTIAL (CANCER CENTER ONLY)
Abs Immature Granulocytes: 0.02 10*3/uL (ref 0.00–0.07)
Basophils Absolute: 0 10*3/uL (ref 0.0–0.1)
Basophils Relative: 1 %
Eosinophils Absolute: 0.1 10*3/uL (ref 0.0–0.5)
Eosinophils Relative: 1 %
HCT: 47.8 % (ref 39.0–52.0)
Hemoglobin: 15.7 g/dL (ref 13.0–17.0)
Immature Granulocytes: 0 %
Lymphocytes Relative: 25 %
Lymphs Abs: 1.4 10*3/uL (ref 0.7–4.0)
MCH: 31.4 pg (ref 26.0–34.0)
MCHC: 32.8 g/dL (ref 30.0–36.0)
MCV: 95.6 fL (ref 80.0–100.0)
Monocytes Absolute: 0.4 10*3/uL (ref 0.1–1.0)
Monocytes Relative: 7 %
Neutro Abs: 3.7 10*3/uL (ref 1.7–7.7)
Neutrophils Relative %: 66 %
Platelet Count: 170 10*3/uL (ref 150–400)
RBC: 5 MIL/uL (ref 4.22–5.81)
RDW: 13.1 % (ref 11.5–15.5)
WBC Count: 5.6 10*3/uL (ref 4.0–10.5)
nRBC: 0 % (ref 0.0–0.2)

## 2023-02-23 NOTE — Telephone Encounter (Signed)
Left message for patient to call back to schedule consult per 5/20 referral.

## 2023-02-24 NOTE — Progress Notes (Signed)
Thoracic Location of Tumor / Histology: RIGHT upper lobe  CT Super D Chest Wo Contrast 01/23/2023  IMPRESSION: 1. Bilateral solid pulmonary nodules are increased in size, concerning for progressive metastatic disease. 2. Coronary artery calcifications and aortic Atherosclerosis (ICD10-I70.0).  NM PET Image Initial (PI) Skull Base To Thigh 02-12-23 IMPRESSION: 1. Hypermetabolic RIGHT upper lobe pulmonary nodule concerning for bronchogenic carcinoma. 2. Hypermetabolic nodules in the LEFT upper lobe and lingula are concerning for synchronous bronchogenic carcinoma. 3. No evidence metastatic mediastinal adenopathy. 4. No evidence distant metastatic disease.  Patient presented with symptoms of: found on PET scan,  (Per Dr. Kavin Leech note on 01-20-23) He has a history of pulmonary nodular disease going back to imaging February 2019.  These had been deemed stable in size and appearance.  He has a rising PSA and this prompted a repeat PET scan done on 12/18/2022.  He is referred today to discuss the PSMA-PET scan findings.   Biopsies revealed:  01-26-23 FINAL MICROSCOPIC DIAGNOSIS:  E. LUNG, RUL TARGET #3, FINE NEEDLE ASPIRATION:  - Positive for adenocarcinoma.   Note: The cells present on the cell block have a mucinous/enteric  appearance.  There is limited material on the cell block for IHC, but  TTF1, CDX2 and CK20 were performed with all being negative.  Dr.  Venetia Night has peer reviewed the case and agrees with the diagnosis of  malignancy.   F. LUNG, RUL TARGET #3, BRUSHING:  - Rare Atypical cells present    SPECIMEN ADEQUACY:  E. Satisfactory for Evaluation  F. Satisfactory for Evaluation, limited cellularity    Tobacco/Marijuana/Snuff/ETOH use:Pt has never smoked.  Second hand smoke exposure. Some asbestos exposure 50 yrs ago (per Dr. Norm Salt note on 01-20-23)  Past/Anticipated interventions by cardiothoracic surgery, if any:  Video Bronchoscopy with Robotic Assisted  Bronchoscopic Navigation    Date of Operation: 01/26/2023    Pre-op Diagnosis: Bilateral pulmonary nodules   Post-op Diagnosis: Same   Surgeon: Levy Pupa  Past/Anticipated interventions by medical oncology, if any:  Heilingoetter, Cassandra L, PA-C 02-23-23 HPI Richard Delacruz is a 72 y.o. male with a past medical history significant for mood disorder, HLD, prostate cancer (dx in 2011 s/p proton beam therapy and androgen depravation) currently followed by Dr. Marlou Porch, hyperlipidemia, DVT on Xarelto, melanoma (dx 5 years ago s/p excision followed by Dr. Charlton Haws from dermatology), and hypertension is referred to the clinic for lung cancer .   ASSESSMENT: This is a very pleasant 72 year old male with possibly 2-3 synchronous primary lung cancers in the RUL, stage IA and possibly stage IIA in the left due to the LUL and lingula. The final pathology of the RUL lesion showed NSCLC, adenocarcinoma. He was diagnosed on April 2024.    Will request moleculars today if enough material.    The patient had a PET scan which showed all of these lesions were hypermetabolic without any nodal or distant metastases.    The case was discussed at the multidisciplinary conference who recommended radiation.    The patient was seen with Dr. Arbutus Ped. Dr. Arbutus Ped recommended referral to radiation oncology. Given the multiple lesions in different lobes, unable to surgically remove all of these lesions.    I have placed a referral to radiation oncology .    We will see him back in 4 months for evaluation with a surveillance CT of the chest   Of note, he has history of prostate cancer dx in 2011 status post proton beam therapy and androgen deprivation. He  also has history of early stage melanoma s/p resection ~5 years ago. Follows with Dr. Charlton Haws from dermatology and recently had annual skin check last week.    The patient voices understanding of current disease status and treatment options and is in  agreement with the current care plan.   Signs/Symptoms Weight changes, if any: Denies  Wt Readings from Last 3 Encounters:  03/05/23 217 lb 12.8 oz (98.8 kg)  02/23/23 222 lb 6.4 oz (100.9 kg)  02/06/23 225 lb 6.4 oz (102.2 kg)   Respiratory complaints, if any: Denies any shortness of breath with activity or issues with a dry/productive cough Hemoptysis, if any: Denies Pain issues, if any:  Denies  SAFETY ISSUES: Prior radiation? Yes, Of note, he has history of prostate cancer dx in 2011 status post proton beam therapy [place], [place]) and androgen deprivation. Pacemaker/ICD?  No Possible current pregnancy? na Is the patient on methotrexate? no  Current Complaints / other details:  Nothing else of note

## 2023-02-26 LAB — FUNGUS CULTURE WITH STAIN

## 2023-02-26 LAB — FUNGUS CULTURE RESULT

## 2023-02-26 LAB — FUNGAL ORGANISM REFLEX

## 2023-03-05 ENCOUNTER — Ambulatory Visit
Admission: RE | Admit: 2023-03-05 | Discharge: 2023-03-05 | Disposition: A | Discharge status: Home / Self Care | Payer: PPO | Source: Ambulatory Visit | Attending: Radiation Oncology | Admitting: Radiation Oncology

## 2023-03-05 ENCOUNTER — Telehealth: Payer: Self-pay

## 2023-03-05 ENCOUNTER — Encounter: Payer: Self-pay | Admitting: Radiation Oncology

## 2023-03-05 VITALS — BP 165/85 | HR 56 | Temp 97.6°F | Resp 20 | Ht 70.0 in | Wt 217.8 lb

## 2023-03-05 DIAGNOSIS — C3411 Malignant neoplasm of upper lobe, right bronchus or lung: Secondary | ICD-10-CM

## 2023-03-05 DIAGNOSIS — C3412 Malignant neoplasm of upper lobe, left bronchus or lung: Secondary | ICD-10-CM

## 2023-03-05 DIAGNOSIS — C3491 Malignant neoplasm of unspecified part of right bronchus or lung: Secondary | ICD-10-CM

## 2023-03-05 DIAGNOSIS — Z923 Personal history of irradiation: Secondary | ICD-10-CM | POA: Diagnosis not present

## 2023-03-05 DIAGNOSIS — I10 Essential (primary) hypertension: Secondary | ICD-10-CM | POA: Diagnosis not present

## 2023-03-05 DIAGNOSIS — Z85820 Personal history of malignant melanoma of skin: Secondary | ICD-10-CM | POA: Diagnosis not present

## 2023-03-05 DIAGNOSIS — K219 Gastro-esophageal reflux disease without esophagitis: Secondary | ICD-10-CM | POA: Diagnosis not present

## 2023-03-05 DIAGNOSIS — Z79899 Other long term (current) drug therapy: Secondary | ICD-10-CM | POA: Diagnosis not present

## 2023-03-05 DIAGNOSIS — Z7901 Long term (current) use of anticoagulants: Secondary | ICD-10-CM | POA: Diagnosis not present

## 2023-03-05 DIAGNOSIS — Z86718 Personal history of other venous thrombosis and embolism: Secondary | ICD-10-CM | POA: Insufficient documentation

## 2023-03-05 NOTE — Telephone Encounter (Signed)
CSW attempted to contact patient to asess psychosocial needs per medical provider referral.  Left vm.

## 2023-03-05 NOTE — Progress Notes (Signed)
Radiation Oncology         [phone] ________________________________  Initial Outpatient Consultation  Name: Richard Delacruz MRN: [id number]  Date: 03/05/2023  DOB: 1951/09/09  WU:[id number], Misty Stanley, MD  Si Gaul, MD   REFERRING PHYSICIAN: Si Gaul, MD  DIAGNOSIS: The primary encounter diagnosis was Malignant neoplasm of right upper lobe of lung (HCC). Diagnoses of Malignant neoplasm of upper lobe bronchus, left (HCC) and Adenocarcinoma of right lung [facility]) were also pertinent to this visit.  Synchronous stage Ia (T1b, N0, M0) adenocarcinoma of the right upper lobe as well as suspicious stage IIb (T3, N0, M0) non-small cell lung cancer involving the lingula and left upper lobe diagnosed in April 2024.   HISTORY OF PRESENT ILLNESS::Richard Delacruz is a 72 y.o. male who is seen as a courtesy of Dr. Arbutus Ped for an opinion concerning radiation therapy as part of management for his recently diagnosed bilateral lung cancers.   The patient has a history of pulmonary nodules dating back to 2019. Prior to recent history, these remained relatively stable in size and appearance per follow-up imaging studies. He also has a history significant for melanoma and prostate cancer diagnosed in 2011, s/p proton beam therapy and androgen deprivation. He continues to follow with urology under surveillance.   This past March, the patient was found to have rising PSA and his PCP recommended a PET scan to rule out disease recurrence. Subsequent PET scan on 12/18/22 demonstrated no signs of PSMA avid disease in the neck, chest, abdomen or pelvis. PET however incidentally revealed an increase in size of several pulmonary nodules in the LUL, LLL, and lingula. A new nodule in the LUL was also noted measuring 9 mm (new when compared to imaging performed in 2020, and developing on imaging performed in July of 2022). Given that the nodules showed no PSMA-PET positivity, metastatic prostate cancer was deemed  unlikely. A non-tracer avid RUL nodule was also appreciated which appeared similar but more conspicuous when compared to prior imaging.   Subsequently, the patent was referred to Dr. Delton Coombes who recommended proceeding with a dedicated chest CT for biopsy planning.   Subsequent super-D chest CT on 01/23/23 demonstrated an increase in size of multiple bilateral solid pulmonary nodules includes a lingular nodule measuring 14 x 9 mm, previously 8 mm, and a solid pulmonary nodule of the right lung apex measuring 9 x 7 mm, previously 3 mm.   The patient opted to proceed with bronchoscopy and biopsies on 01/26/23 under the care of Dr. Delton Coombes. Biopsies of the LUL and a lingular biopsy showed atypical cells present. Biopsy of the RUL showed findings consistent with adenocarcinoma. (RUL lavage showed no malignant cells).   Bronchoscopy was complicated by post-procedural left pneumothorax requiring chest tube placement and admission. He clinically improved and was discharged on 01/27/2023.   Staging work-up studies are detailed as follows:  -- PET scan on 02/12/23 showed a hypermetabolic RUL nodule, and hypermetabolic nodules in the LUL and lingula concerning for synchronous bronchogenic carcinoma. PET otherwise showed no evidence of metastatic mediastinal adenopathy or evidence of distant metastatic disease.  -- MRI of the brain on 02/12/23 demonstrated no evidence of intracranial metastatic disease.   His case was presented at the multidisciplinary conference held on 02/12/23. Recommended disposition is to radiation therapy.   He was promptly referred to medical oncology and met with PA Heilingoetter on 02/23/23. Based on his bilateral disease, the patient is not a good candidate for resection. In light of this, Dr. Arbutus Ped  recommends consideration of SBRT to the 3 pulmonary nodules.  Dr. Asa Lente office would like to see him again in 4 months for re-evaluation and repeat imaging of the chest for restaging of  his disease.   Of note: The patient's wife unfortunately passed away around the end of 28-Jan-2023 while he was undergoing work-up. He has had some weight loss/decreased p.o intake which he attributes to stress from his wife's passing and his recent diagnosis.   Today, patient states his breathing is fine overall. He denies any shortness of breath, cough, chest pain, or other issues with his breathing.   PREVIOUS RADIATION THERAPY: Yes   Proton therapy for his prostate in 2011 [place], [place])  PAST MEDICAL HISTORY:  Past Medical History:  Diagnosis Date   Anxiety    Arthritis    Cancer (HCC)    Prostate and melanoma   Depression    DVT (deep venous thrombosis) (HCC)    right lower leg   GERD (gastroesophageal reflux disease)    Hypertension     PAST SURGICAL HISTORY: Past Surgical History:  Procedure Laterality Date   ADENOIDECTOMY     BRONCHIAL BIOPSY  01/26/2023   Procedure: BRONCHIAL BIOPSIES;  Surgeon: Leslye Peer, MD;  Location: [facility] ENDOSCOPY;  Service: Pulmonary;;   BRONCHIAL BRUSHINGS  01/26/2023   Procedure: BRONCHIAL BRUSHINGS;  Surgeon: Leslye Peer, MD;  Location: [facility] ENDOSCOPY;  Service: Pulmonary;;   BRONCHIAL NEEDLE ASPIRATION BIOPSY  01/26/2023   Procedure: BRONCHIAL NEEDLE ASPIRATION BIOPSIES;  Surgeon: Leslye Peer, MD;  Location: [facility] ENDOSCOPY;  Service: Pulmonary;;   COLONOSCOPY     TONSILLECTOMY     VIDEO BRONCHOSCOPY WITH RADIAL ENDOBRONCHIAL ULTRASOUND  01/26/2023   Procedure: VIDEO BRONCHOSCOPY WITH RADIAL ENDOBRONCHIAL ULTRASOUND;  Surgeon: Leslye Peer, MD;  Location: [facility] ENDOSCOPY;  Service: Pulmonary;;    FAMILY HISTORY: No family history on file.  SOCIAL HISTORY:  Social History   Tobacco Use   Smoking status: Never    Passive exposure: Past   Smokeless tobacco: Never  Vaping Use   Vaping Use: Never used  Substance Use Topics   Alcohol use: No   Drug use: No    ALLERGIES:  Allergies  Allergen Reactions   Atacand [Candesartan]  Anaphylaxis   Amlodipine     Swelling, joint and muscle pain    Monopril [Fosinopril] Cough   Naproxen Other (See Comments)    Abdominal pain    Niaspan [Niacin Er] Rash    MEDICATIONS:  Current Outpatient Medications  Medication Sig Dispense Refill   acetaminophen (TYLENOL) 500 MG tablet Take 1,000 mg by mouth every 6 (six) hours as needed for moderate pain.     atorvastatin (LIPITOR) 40 MG tablet Take 40 mg by mouth daily.     buPROPion (WELLBUTRIN SR) 150 MG 12 hr tablet Take 150 mg by mouth 3 (three) times daily.     Calcium Citrate-Vitamin D (CALCIUM + D PO) Take 2 tablets by mouth daily.     clonazePAM (KLONOPIN) 1 MG tablet Take 1 mg by mouth 2 (two) times daily.      diphenhydramine-acetaminophen (TYLENOL PM) 25-500 MG TABS tablet Take 2 tablets by mouth at bedtime.     lamoTRIgine (LAMICTAL) 100 MG tablet Take 100 mg by mouth 2 (two) times daily.     metoprolol succinate (TOPROL-XL) 100 MG 24 hr tablet Take 100 mg by mouth daily. Take with or immediately following a meal.     Multiple Vitamin (MULTIVITAMIN WITH MINERALS) TABS tablet  Take 1 tablet by mouth daily.     rivaroxaban (XARELTO) 10 MG TABS tablet Take 1 tablet (10 mg total) by mouth daily. 30 tablet 0   No current facility-administered medications for this encounter.    REVIEW OF SYSTEMS:  As per HPI   PHYSICAL EXAM:  height is 5\' 10"  (1.778 m) and weight is 217 lb 12.8 oz (98.8 kg). His temperature is 97.6 F (36.4 C). His blood pressure is 165/85 (abnormal) and his pulse is 56 (abnormal). His respiration is 20 and oxygen saturation is 100%.   General: Alert and oriented, in no acute distress HEENT: Head is normocephalic. Extraocular movements are intact.  Neck: Neck is supple, no palpable cervical or supraclavicular lymphadenopathy. Heart: Regular in rate and rhythm with no murmurs, rubs, or gallops. Chest: Clear to auscultation bilaterally, with no rhonchi, wheezes, or rales. Abdomen: Soft, nontender,  nondistended, with no rigidity or guarding. Extremities: No cyanosis or edema. Lymphatics: see Neck Exam Skin: No concerning lesions. Musculoskeletal: symmetric strength and muscle tone throughout. Neurologic: Cranial nerves II through XII are grossly intact. No obvious focalities. Speech is fluent. Coordination is intact. Psychiatric: Judgment and insight are intact. Depressed affect   ECOG = 0  0 - Asymptomatic (Fully active, able to carry on all predisease activities without restriction)  1 - Symptomatic but completely ambulatory (Restricted in physically strenuous activity but ambulatory and able to carry out work of a light or sedentary nature. For example, light housework, office work)  2 - Symptomatic, <50% in bed during the day (Ambulatory and capable of all self care but unable to carry out any work activities. Up and about more than 50% of waking hours)  3 - Symptomatic, >50% in bed, but not bedbound (Capable of only limited self-care, confined to bed or chair 50% or more of waking hours)  4 - Bedbound (Completely disabled. Cannot carry on any self-care. Totally confined to bed or chair)  5 - Death   Santiago Glad MM, Creech RH, Tormey DC, et al. [phone]). "Toxicity and response criteria of the [facility]". Am. Evlyn Clines. Oncol. 5 (6): 649-55  LABORATORY DATA:  Lab Results  Component Value Date   WBC 5.6 02/23/2023   HGB 15.7 02/23/2023   HCT 47.8 02/23/2023   MCV 95.6 02/23/2023   PLT 170 02/23/2023   NEUTROABS 3.7 02/23/2023   Lab Results  Component Value Date   NA 142 02/23/2023   K 3.8 02/23/2023   CL 106 02/23/2023   CO2 28 02/23/2023   GLUCOSE 100 (H) 02/23/2023   BUN 14 02/23/2023   CREATININE 1.15 02/23/2023   CALCIUM 9.5 02/23/2023      RADIOGRAPHY: NM PET Image Initial (PI) Skull Base To Thigh  Result Date: 02/16/2023 CLINICAL DATA:  Initial treatment strategy for pulmonary nodules. History of prostate cancer. Non PSMA avid pulmonary  nodules. Concern for bronchogenic carcinoma. EXAM: NUCLEAR MEDICINE PET SKULL BASE TO THIGH TECHNIQUE: 12.48 mCi F-18 FDG was injected intravenously. Full-ring PET imaging was performed from the skull base to thigh after the radiotracer. CT data was obtained and used for attenuation correction and anatomic localization. Fasting blood glucose: 93 mg/dl COMPARISON:  PSMA PET scan 12/18/2022, chest CT 01/23/2023 FINDINGS: Mediastinal blood pool activity: SUV max 3.0 Liver activity: SUV max NA NECK: No hypermetabolic lymph nodes in the neck. Incidental CT findings: None. CHEST: Within the medial aspect of the RIGHT upper lobe, oblong nodule measuring 18 mm by 13 mm (image 49/CT series 4)adn has metabolic  activity with SUV max equal 4.7. Smoothly lobulated nodule in the inferior lingula measures 13 mm (image 65) with SUV max equal 2.5. (Misregistration related to breathing) . LEFT upper lobe nodule measuring 11 mm (image 47/4) also has metabolic activity with SUV max equal 3.0 No hypermetabolic mediastinal lymph nodes. Incidental CT findings: None. ABDOMEN/PELVIS: No abnormal hypermetabolic activity within the liver, pancreas, adrenal glands, or spleen. No hypermetabolic lymph nodes in the abdomen or pelvis. Incidental CT findings: Simple fluid attenuation cyst of the LEFT kidney without metabolic activity. several LEFT renal calculi without obstruction. Ureters and bladder normal. SKELETON: No focal hypermetabolic activity to suggest skeletal metastasis. Incidental CT findings: None. IMPRESSION: 1. Hypermetabolic RIGHT upper lobe pulmonary nodule concerning for bronchogenic carcinoma. 2. Hypermetabolic nodules in the LEFT upper lobe and lingula are concerning for synchronous bronchogenic carcinoma. 3. No evidence metastatic mediastinal adenopathy. 4. No evidence distant metastatic disease. Electronically Signed   By: Genevive Bi M.D.   On: 02/16/2023 11:51   MR BRAIN W WO CONTRAST  Result Date:  02/12/2023 CLINICAL DATA:  Provided history: Adenocarcinoma of right lung. Malignant neoplasm of unspecified part of unspecified bronchus or lung. Non-small cell lung cancer, staging. Metastatic disease evaluation. EXAM: MRI HEAD WITHOUT AND WITH CONTRAST TECHNIQUE: Multiplanar, multiecho pulse sequences of the brain and surrounding structures were obtained without and with intravenous contrast. CONTRAST:  10mL GADAVIST GADOBUTROL 1 MMOL/ML IV SOLN COMPARISON:  Head CT 12/30/2022. FINDINGS: Brain: Mild cerebral atrophy. No cortical encephalomalacia is identified. No significant cerebral white matter disease for age. There is no acute infarct. No evidence of an intracranial mass. No chronic intracranial blood products. No extra-axial fluid collection. No midline shift. No pathologic intracranial enhancement identified. Vascular: Maintained flow voids within the proximal large arterial vessels. Skull and upper cervical spine: No focal suspicious marrow lesion. Sinuses/Orbits: No mass or acute finding within the imaged orbits. Small mucous retention cysts within the right sphenoid sinus. Other: Nonspecific 12 mm cystic appearing cutaneous/subcutaneous lesion within the left retroauricular scalp, unchanged in size from the prior head CT of 12/30/2022. IMPRESSION: 1.  No evidence of intracranial metastatic disease. 2. Mild cerebral atrophy. 3. Nonspecific 12 mm cystic appearing cutaneous/subcutaneous lesion within the left retroauricular scalp, unchanged in size from the head CT of 12/30/2022. Direct examination recommended. 4. Small mucous retention cyst within the right sphenoid sinus. Electronically Signed   By: Jackey Loge D.O.   On: 02/12/2023 08:45   DG Chest 2 View  Result Date: 02/10/2023 CLINICAL DATA:  PTX f/u EXAM: CHEST - 2 VIEW COMPARISON:  None available. FINDINGS: The heart size and mediastinal contours are within normal limits. Both lungs are clear. No pneumothorax or pleural effusion. There are  thoracic degenerative changes. IMPRESSION: No active cardiopulmonary disease. Electronically Signed   By: Layla Maw M.D.   On: 02/10/2023 21:39     Dr. Roselind Messier personally reviewed the images.     IMPRESSION: Synchronous stage IA (T1b, N0, M0) adenocarcinoma of the right upper lobe as well as suspicious stage IIb (T3, N0, M0) non-small cell lung cancer involving the lingula and left upper lobe diagnosed in April 2024.  Dr. Arbutus Ped evaluated the patient and determined that, due to bilateral disease, he is not a candidate for surgery. Dr. Roselind Messier recommends stereotactic body radiation (SBRT) to the biopsy confirmed adenocarcinoma of the right upper lobe lung nodule and suspicious left lung nodules.   Today, I talked to the patient about the findings and work-up thus far.  We discussed the natural  history of early stage lung cancer and general treatment, highlighting the role of radiotherapy in the management.  We discussed the available radiation techniques, and focused on the details of logistics and delivery.  We reviewed the anticipated acute and late sequelae associated with radiation in this setting.  The patient was encouraged to ask questions that I answered to the best of my ability.  A patient consent form was discussed and signed.  We retained a copy for our records.  The patient would like to proceed with radiation and will be scheduled for CT simulation.  PLAN: Patient is scheduled for CT simulation on 03/12/23. Dr. Roselind Messier anticipates 3-5 fractions of SBRT to the bilateral lung nodules.     60 minutes of total time was spent for this patient encounter, including preparation, face-to-face counseling with the patient and coordination of care, physical exam, and documentation of the encounter.   ------------------------------------------------   Joyice Faster, PA-C   Billie Lade, PhD, MD  This document serves as a record of services personally performed by Antony Blackbird, MD. It was  created on his behalf by Neena Rhymes, a trained medical scribe. The creation of this record is based on the scribe's personal observations and the provider's statements to them. This document has been checked and approved by the attending provider.

## 2023-03-12 ENCOUNTER — Ambulatory Visit
Admission: RE | Admit: 2023-03-12 | Discharge: 2023-03-12 | Disposition: A | Discharge status: Home / Self Care | Payer: PPO | Source: Ambulatory Visit | Attending: Radiation Oncology | Admitting: Radiation Oncology

## 2023-03-12 ENCOUNTER — Other Ambulatory Visit: Payer: Self-pay

## 2023-03-12 DIAGNOSIS — C3411 Malignant neoplasm of upper lobe, right bronchus or lung: Secondary | ICD-10-CM | POA: Diagnosis not present

## 2023-03-12 DIAGNOSIS — C3432 Malignant neoplasm of lower lobe, left bronchus or lung: Secondary | ICD-10-CM | POA: Diagnosis not present

## 2023-03-12 DIAGNOSIS — Z510 Encounter for antineoplastic radiation therapy: Secondary | ICD-10-CM | POA: Diagnosis not present

## 2023-03-12 DIAGNOSIS — R918 Other nonspecific abnormal finding of lung field: Secondary | ICD-10-CM

## 2023-03-12 DIAGNOSIS — C3412 Malignant neoplasm of upper lobe, left bronchus or lung: Secondary | ICD-10-CM | POA: Diagnosis not present

## 2023-03-13 LAB — ACID FAST CULTURE WITH REFLEXED SENSITIVITIES (MYCOBACTERIA): Acid Fast Culture: NEGATIVE

## 2023-03-18 ENCOUNTER — Encounter (HOSPITAL_COMMUNITY): Payer: Self-pay | Admitting: *Deleted

## 2023-03-18 ENCOUNTER — Ambulatory Visit (HOSPITAL_COMMUNITY)
Admission: EM | Admit: 2023-03-18 | Discharge: 2023-03-18 | Disposition: A | Discharge status: Home / Self Care | Payer: PPO | Attending: Internal Medicine | Admitting: Internal Medicine

## 2023-03-18 DIAGNOSIS — R109 Unspecified abdominal pain: Secondary | ICD-10-CM | POA: Diagnosis not present

## 2023-03-18 LAB — POCT URINALYSIS DIP (MANUAL ENTRY)
Glucose, UA: NEGATIVE mg/dL
Leukocytes, UA: NEGATIVE
Nitrite, UA: NEGATIVE
Protein Ur, POC: 100 mg/dL — AB
Spec Grav, UA: 1.025 (ref 1.010–1.025)
Urobilinogen, UA: 0.2 E.U./dL
pH, UA: 5.5 (ref 5.0–8.0)

## 2023-03-18 MED ORDER — KETOROLAC TROMETHAMINE 30 MG/ML IJ SOLN
30.0000 mg | Freq: Once | INTRAMUSCULAR | Status: AC
  Administered 2023-03-18: 30 mg via INTRAMUSCULAR

## 2023-03-18 MED ORDER — KETOROLAC TROMETHAMINE 30 MG/ML IJ SOLN
INTRAMUSCULAR | Status: AC
  Filled 2023-03-18: qty 1

## 2023-03-18 MED ORDER — TAMSULOSIN HCL 0.4 MG PO CAPS: 0.4000 mg | ORAL_CAPSULE | Freq: Every day | ORAL | 0 refills | Status: DC

## 2023-03-18 NOTE — Discharge Instructions (Signed)
I suspect that you may have a kidney stone.  I gave you a shot of ketorolac in the clinic which is a strong anti-inflammatory medication that helps to treat kidney stone pain.  Take tamsulosin once daily for the next 3 to 5 days until your pain improves to help you pass the stone.  I have sent your urine for culture to investigate for possible infection to the urine, however I do not think this is likely.  Return to urgent care or go to the nearest ER if your symptoms do not get better in the next 24-48 hours.  Lean on those around you during this difficult time.  [facility] is a GREAT place to get started.  [email or web address]  [address] [place], [place] [address]  2.1 mi [phone]  Schedule an appointment with Dr. Hyacinth Meeker to discuss medication therapy. Schedule an appointment with [facility] for follow-up.   I'll be holding you and Casimiro Needle in the light.

## 2023-03-18 NOTE — ED Notes (Signed)
Urine strainer provided

## 2023-03-18 NOTE — ED Triage Notes (Signed)
Pt states he woke up this morning with left side pain and it has been getting worse since then to the point it is making him nauseous. He took two tylenol this morning without relief.   Pt was recently dx with lung cancer and hasn't started treatment yet.

## 2023-03-18 NOTE — ED Provider Notes (Signed)
[facility]    CSN: [id number] Arrival date & time: 03/18/23  1112      History   Chief Complaint Chief Complaint  Patient presents with   Flank Pain   Nausea    HPI Richard Delacruz is a 72 y.o. male.   Patient presents to urgent care for evaluation of pain to the left lateral lower abdomen that started this morning on waking. States the pain is constant, achy, and sometimes throbbing. Pain worsens in intensity then eases up slightly. States pain improved slightly after he voided while at urgent care to provide a sample. He did not notice any blood in his urine this morning when he first urinated but states he saw 4-5 drops of blood in his urine with "particles" in the urine when providing urine sample at urgent care. No recent fevers, chills, suprapubic pain, upper back pain, low back pain, urinary hesitancy, urinary frequency, or urinary urgency. Reports slight nausea without vomiting. No new urinary incontinence. Reports bowel movements are normal. No blood/mucous to the stools. He was recently diagnosed with lung cancer 7 weeks ago and starts treatment for this next week. History of prostate cancer. Denies personal and family history of kidney stones. Denies history of UTI/pyelonephritis. No recent antibiotic/steroid use. Denies frequent intake of known urinary irritants. Tylenol this morning did not help with pain.    Flank Pain    Past Medical History:  Diagnosis Date   Anxiety    Arthritis    Cancer (HCC)    Prostate and melanoma   Depression    DVT (deep venous thrombosis) (HCC)    right lower leg   GERD (gastroesophageal reflux disease)    Hypertension     Patient Active Problem List   Diagnosis Date Noted   Adenocarcinoma of right lung (HCC) 02/06/2023   Grief 02/06/2023   Pneumothorax 01/26/2023   Pulmonary nodules 01/20/2023    Past Surgical History:  Procedure Laterality Date   ADENOIDECTOMY     BRONCHIAL BIOPSY  01/26/2023   Procedure:  BRONCHIAL BIOPSIES;  Surgeon: Leslye Peer, MD;  Location: [facility];  Service: Pulmonary;;   BRONCHIAL BRUSHINGS  01/26/2023   Procedure: BRONCHIAL BRUSHINGS;  Surgeon: Leslye Peer, MD;  Location: [facility] END[facility];  Service: Pulmonary;;   BRONCHIAL NEEDLE ASPIRATION BIOPSY  01/26/2023   Procedure: BRONCHIAL NEEDLE ASPIRATION BIOPSIES;  Surgeon: Leslye Peer, MD;  Location: [facility];  Service: Pulmonary;;   COLONOSCOPY     TONSILLECTOMY     VIDEO BRONCHOSCOPY WITH RADIAL ENDOBRONCHIAL ULTRASOUND  01/26/2023   Procedure: VIDEO BRONCHOSCOPY WITH RADIAL ENDOBRONCHIAL ULTRASOUND;  Surgeon: Leslye Peer, MD;  Location: [facility];  Service: Pulmonary;;       Home Medications    Prior to Admission medications   Medication Sig Start Date End Date Taking? Authorizing Provider  acetaminophen (TYLENOL) 500 MG tablet Take 1,000 mg by mouth every 6 (six) hours as needed for moderate pain.   Yes [provider]  atorvastatin (LIPITOR) 40 MG tablet Take 40 mg by mouth daily. 02/13/16  Yes [provider]  buPROPion (WELLBUTRIN SR) 150 MG 12 hr tablet Take 150 mg by mouth 3 (three) times daily. 04/25/14  Yes [provider]  Calcium Citrate-Vitamin D (CALCIUM + D PO) Take 2 tablets by mouth daily.   Yes [provider]  clonazePAM (KLONOPIN) 1 MG tablet Take 1 mg by mouth 2 (two) times daily.  05/26/14  Yes [provider]  diphenhydramine-acetaminophen (TYLENOL PM)  25-500 MG TABS tablet Take 2 tablets by mouth at bedtime.   Yes [provider]  lamoTRIgine (LAMICTAL) 100 MG tablet Take 100 mg by mouth 2 (two) times daily. 03/06/14  Yes [provider]  metoprolol succinate (TOPROL-XL) 100 MG 24 hr tablet Take 100 mg by mouth daily. Take with or immediately following a meal.   Yes [provider]  Multiple Vitamin (MULTIVITAMIN WITH MINERALS) TABS tablet Take 1 tablet by mouth daily.   Yes [provider]   rivaroxaban (XARELTO) 10 MG TABS tablet Take 1 tablet (10 mg total) by mouth daily. 01/28/23  Yes Leslye Peer, MD  tamsulosin (FLOMAX) 0.4 MG CAPS capsule Take 1 capsule (0.4 mg total) by mouth daily. 03/18/23  Yes Carlisle Beers, FNP    Family History History reviewed. No pertinent family history.  Social History Social History   Tobacco Use   Smoking status: Never    Passive exposure: Past   Smokeless tobacco: Never  Vaping Use   Vaping Use: Never used  Substance Use Topics   Alcohol use: No   Drug use: No     Allergies   Atacand [candesartan], Amlodipine, Monopril [fosinopril], Naproxen, and Niaspan [niacin er]   Review of Systems Review of Systems  Genitourinary:  Positive for flank pain.     Physical Exam Triage Vital Signs ED Triage Vitals  Enc Vitals Group     BP 03/18/23 1136 (!) 179/91     Pulse Rate 03/18/23 1136 62     Resp 03/18/23 1136 18     Temp 03/18/23 1136 98.5 F (36.9 C)     Temp Source 03/18/23 1136 Oral     SpO2 03/18/23 1136 97 %     Weight --      Height --      Head Circumference --      Peak Flow --      Pain Score 03/18/23 1135 9     Pain Loc --      Pain Edu? --      Excl. in GC? --    No data found.  Updated Vital Signs BP (!) 179/91 (BP Location: Right Arm)   Pulse 62   Temp 98.5 F (36.9 C) (Oral)   Resp 18   SpO2 97%   Visual Acuity Right Eye Distance:   Left Eye Distance:   Bilateral Distance:    Right Eye Near:   Left Eye Near:    Bilateral Near:     Physical Exam Vitals and nursing note reviewed.  Constitutional:      Appearance: He is not ill-appearing or toxic-appearing.  HENT:     Head: Normocephalic and atraumatic.     Right Ear: Hearing and external ear normal.     Left Ear: Hearing and external ear normal.     Nose: Nose normal.     Mouth/Throat:     Lips: Pink.  Eyes:     General: Lids are normal. Vision grossly intact. Gaze aligned appropriately.     Extraocular Movements:  Extraocular movements intact.     Conjunctiva/sclera: Conjunctivae normal.  Pulmonary:     Effort: Pulmonary effort is normal.  Abdominal:     General: Abdomen is flat. Bowel sounds are normal.     Palpations: Abdomen is soft.     Tenderness: There is abdominal tenderness. There is no right CVA tenderness, left CVA tenderness or guarding.       Comments: Mild tenderness to palpation of  the left lateral abdomen. No CVA tenderness bilaterally. No overlying rash or erythema to skin.   Musculoskeletal:     Cervical back: Neck supple.  Skin:    General: Skin is warm and dry.     Capillary Refill: Capillary refill takes less than 2 seconds.     Findings: No rash.  Neurological:     General: No focal deficit present.     Mental Status: He is alert and oriented to person, place, and time. Mental status is at baseline.     Cranial Nerves: No dysarthria or facial asymmetry.  Psychiatric:        Mood and Affect: Mood normal.        Speech: Speech normal.        Behavior: Behavior normal.        Thought Content: Thought content normal.        Judgment: Judgment normal.      UC Treatments / Results  Labs (all labs ordered are listed, but only abnormal results are displayed) Labs Reviewed  POCT URINALYSIS DIP (MANUAL ENTRY) - Abnormal; Notable for the following components:      Result Value   Color, UA orange (*)    Clarity, UA cloudy (*)    Bilirubin, UA small (*)    Ketones, POC UA trace (5) (*)    Blood, UA large (*)    Protein Ur, POC =100 (*)    All other components within normal limits  URINE CULTURE    EKG   Radiology No results found.  Procedures Procedures (including critical care time)  Medications Ordered in UC Medications  ketorolac (TORADOL) 30 MG/ML injection 30 mg (30 mg Intramuscular Given 03/18/23 1243)    Initial Impression / Assessment and Plan / UC Course  I have reviewed the triage vital signs and the nursing notes.  Pertinent labs & imaging  results that were available during my care of the patient were reviewed by me and considered in my medical decision making (see chart for details).   1. Acute left flank pain Urinalysis shows cloudy urine with blood in the urine. No leukocytes. Low suspicion for infectious etiology, however urine culture pending due to symptoms and history of immunosuppression. Patient states pain improved significantly after he voided while in the clinic. Urine provided strained and there is evidence of a particle to the urine, however suspect this is a blood clot. High suspicion for nephrolithiasis etiology. He is overall well-appearing with hemodynamically stable vital signs. Afebrile. No systemic signs/symptoms of infection. Patient was likely able to pass part of/all of the suspected stone while he was in the clinic. Ketorolac 30mg  IM given. He may start tamsulosin 0.4mg  QD today and take this for the next 3-5 days until pain improves. Strict ER and urgent care return precautions discussed. He is agreeable with plan.   Discussed physical exam and available lab work findings in clinic with patient.  Counseled patient regarding appropriate use of medications and potential side effects for all medications recommended or prescribed today. Discussed red flag signs and symptoms of worsening condition,when to call the PCP office, return to urgent care, and when to seek higher level of care in the emergency department. Patient verbalizes understanding and agreement with plan. All questions answered. Patient discharged in stable condition.    Final Clinical Impressions(s) / UC Diagnoses   Final diagnoses:  Acute left flank pain     Discharge Instructions      I suspect that you may have  a kidney stone.  I gave you a shot of ketorolac in the clinic which is a strong anti-inflammatory medication that helps to treat kidney stone pain.  Take tamsulosin once daily for the next 3 to 5 days until your pain improves to  help you pass the stone.  I have sent your urine for culture to investigate for possible infection to the urine, however I do not think this is likely.  Return to urgent care or go to the nearest ER if your symptoms do not get better in the next 24-48 hours.  Lean on those around you during this difficult time.  [facility] is a GREAT place to get started.  [email or web address]  [address], [place], [place] [address]  2.1 mi [phone]  Schedule an appointment with Dr. Hyacinth Meeker to discuss medication therapy. Schedule an appointment with [facility] for follow-up.   I'll be holding you and Casimiro Needle in the light.     ED Prescriptions     Medication Sig Dispense Auth. Provider   tamsulosin (FLOMAX) 0.4 MG CAPS capsule Take 1 capsule (0.4 mg total) by mouth daily. 14 capsule Carlisle Beers, FNP      PDMP not reviewed this encounter.   Carlisle Beers, [place] 03/18/23 1303

## 2023-03-19 LAB — URINE CULTURE: Culture: NO GROWTH

## 2023-03-20 ENCOUNTER — Encounter (HOSPITAL_COMMUNITY): Payer: Self-pay

## 2023-03-20 ENCOUNTER — Ambulatory Visit (HOSPITAL_COMMUNITY)
Admission: RE | Admit: 2023-03-20 | Discharge: 2023-03-20 | Disposition: A | Discharge status: Home / Self Care | Payer: PPO | Source: Ambulatory Visit | Attending: Emergency Medicine | Admitting: Emergency Medicine

## 2023-03-20 VITALS — BP 114/67 | HR 64 | Temp 97.9°F | Resp 17

## 2023-03-20 DIAGNOSIS — R109 Unspecified abdominal pain: Secondary | ICD-10-CM | POA: Diagnosis not present

## 2023-03-20 DIAGNOSIS — R110 Nausea: Secondary | ICD-10-CM

## 2023-03-20 LAB — POCT URINALYSIS DIP (MANUAL ENTRY)
Bilirubin, UA: NEGATIVE
Glucose, UA: NEGATIVE mg/dL
Leukocytes, UA: NEGATIVE
Nitrite, UA: NEGATIVE
Protein Ur, POC: NEGATIVE mg/dL
Spec Grav, UA: 1.01 (ref 1.010–1.025)
Urobilinogen, UA: 0.2 E.U./dL
pH, UA: 5.5 (ref 5.0–8.0)

## 2023-03-20 MED ORDER — ONDANSETRON 4 MG PO TBDP
4.0000 mg | ORAL_TABLET | Freq: Once | ORAL | Status: AC
  Administered 2023-03-20: 4 mg via ORAL

## 2023-03-20 MED ORDER — ONDANSETRON 4 MG PO TBDP
ORAL_TABLET | ORAL | Status: AC
  Filled 2023-03-20: qty 1

## 2023-03-20 MED ORDER — ONDANSETRON 4 MG PO TBDP: 4.0000 mg | ORAL_TABLET | Freq: Three times a day (TID) | ORAL | 0 refills | Status: DC | PRN

## 2023-03-20 MED ORDER — KETOROLAC TROMETHAMINE 30 MG/ML IJ SOLN
INTRAMUSCULAR | Status: AC
  Filled 2023-03-20: qty 1

## 2023-03-20 MED ORDER — KETOROLAC TROMETHAMINE 30 MG/ML IJ SOLN
30.0000 mg | Freq: Once | INTRAMUSCULAR | Status: AC
  Administered 2023-03-20: 30 mg via INTRAMUSCULAR

## 2023-03-20 NOTE — Discharge Instructions (Addendum)
Your symptoms could be caused by kidney stone.  Your urine does not show signs of infection. Please ensure you are taking the Flomax and drinking at least 64 ounces of water daily.  We have given you a Toradol injection in clinic for your pain.  You can take the Zofran as needed for nausea.   Please seek immediate care at the nearest emergency department if you develop fever, vomiting, worsening flank pain, or inability to urinate.

## 2023-03-20 NOTE — ED Provider Notes (Signed)
[facility]    CSN: [id number] Arrival date & time: 03/20/23  4098      History   Chief Complaint Chief Complaint  Patient presents with   Flank Pain    HPI Richard Delacruz is a 72 y.o. male.   Patient presents to clinic for continued left flank pain.  Reports he does have nausea, denies emesis.  When he was in clinic Wednesday, the Toradol helped greatly with his pain.  He has been taking the Flomax.  Reports his urine has since improved, no more orange urine, no more brown or bloody flecks.  Reports his left flank area is tender to palpation, reports pain with twisting and bending.  Denies any recent heavy lifting, he was sitting down when his pain started.  Pain is a constant 8 out of 10.  Hx of prostate and lung cancer.     The history is provided by the patient and medical records.  Flank Pain    Past Medical History:  Diagnosis Date   Anxiety    Arthritis    Cancer (HCC)    Prostate and melanoma   Depression    DVT (deep venous thrombosis) (HCC)    right lower leg   GERD (gastroesophageal reflux disease)    Hypertension     Patient Active Problem List   Diagnosis Date Noted   Adenocarcinoma of right lung (HCC) 02/06/2023   Grief 02/06/2023   Pneumothorax 01/26/2023   Pulmonary nodules 01/20/2023    Past Surgical History:  Procedure Laterality Date   ADENOIDECTOMY     BRONCHIAL BIOPSY  01/26/2023   Procedure: BRONCHIAL BIOPSIES;  Surgeon: Leslye Peer, MD;  Location: [facility];  Service: Pulmonary;;   BRONCHIAL BRUSHINGS  01/26/2023   Procedure: BRONCHIAL BRUSHINGS;  Surgeon: Leslye Peer, MD;  Location: [facility] ENDOSCOPY;  Service: Pulmonary;;   BRONCHIAL NEEDLE ASPIRATION BIOPSY  01/26/2023   Procedure: BRONCHIAL NEEDLE ASPIRATION BIOPSIES;  Surgeon: Leslye Peer, MD;  Location: [facility];  Service: Pulmonary;;   COLONOSCOPY     TONSILLECTOMY     VIDEO BRONCHOSCOPY WITH RADIAL ENDOBRONCHIAL ULTRASOUND  01/26/2023   Procedure:  VIDEO BRONCHOSCOPY WITH RADIAL ENDOBRONCHIAL ULTRASOUND;  Surgeon: Leslye Peer, MD;  Location: [facility];  Service: Pulmonary;;       Home Medications    Prior to Admission medications   Medication Sig Start Date End Date Taking? Authorizing Provider  ondansetron (ZOFRAN-ODT) 4 MG disintegrating tablet Take 1 tablet (4 mg total) by mouth every 8 (eight) hours as needed for nausea or vomiting. 03/20/23  Yes Rinaldo Ratel, [place], FNP  acetaminophen (TYLENOL) 500 MG tablet Take 1,000 mg by mouth every 6 (six) hours as needed for moderate pain.    [provider]  atorvastatin (LIPITOR) 40 MG tablet Take 40 mg by mouth daily. 02/13/16   [provider]  buPROPion (WELLBUTRIN SR) 150 MG 12 hr tablet Take 150 mg by mouth 3 (three) times daily. 04/25/14   [provider]  Calcium Citrate-Vitamin D (CALCIUM + D PO) Take 2 tablets by mouth daily.    [provider]  clonazePAM (KLONOPIN) 1 MG tablet Take 1 mg by mouth 2 (two) times daily.  05/26/14   [provider]  diphenhydramine-acetaminophen (TYLENOL PM) 25-500 MG TABS tablet Take 2 tablets by mouth at bedtime.    [provider]  lamoTRIgine (LAMICTAL) 100 MG tablet Take 100 mg by mouth 2 (two) times daily. 03/06/14   [provider]  metoprolol succinate (TOPROL-XL) 100 MG 24 hr tablet Take 100 mg by mouth daily. Take with or immediately following a meal.    [provider]  Multiple Vitamin (MULTIVITAMIN WITH MINERALS) TABS tablet Take 1 tablet by mouth daily.    [provider]  rivaroxaban (XARELTO) 10 MG TABS tablet Take 1 tablet (10 mg total) by mouth daily. 01/28/23   Leslye Peer, MD  tamsulosin (FLOMAX) 0.4 MG CAPS capsule Take 1 capsule (0.4 mg total) by mouth daily. 03/18/23   Carlisle Beers, FNP    Family History No family history on file.  Social History Social History   Tobacco Use   Smoking status: Never    Passive exposure: Past    Smokeless tobacco: Never  Vaping Use   Vaping Use: Never used  Substance Use Topics   Alcohol use: No   Drug use: No     Allergies   Atacand [candesartan], Amlodipine, Monopril [fosinopril], Naproxen, and Niaspan [niacin er]   Review of Systems Review of Systems  Genitourinary:  Positive for flank pain.     Physical Exam Triage Vital Signs ED Triage Vitals [03/20/23 0842]  Enc Vitals Group     BP 114/67     Pulse Rate 64     Resp 17     Temp 97.9 F (36.6 C)     Temp Source Oral     SpO2 98 %     Weight      Height      Head Circumference      Peak Flow      Pain Score 9     Pain Loc      Pain Edu?      Excl. in GC?    No data found.  Updated Vital Signs BP 114/67 (BP Location: Left Arm)   Pulse 64   Temp 97.9 F (36.6 C) (Oral)   Resp 17   SpO2 98%   Visual Acuity Right Eye Distance:   Left Eye Distance:   Bilateral Distance:    Right Eye Near:   Left Eye Near:    Bilateral Near:     Physical Exam Vitals and nursing note reviewed.  Constitutional:      Appearance: Normal appearance.  HENT:     Head: Normocephalic and atraumatic.     Right Ear: External ear normal.     Left Ear: External ear normal.     Nose: Nose normal.     Mouth/Throat:     Mouth: Mucous membranes are moist.  Eyes:     Conjunctiva/sclera: Conjunctivae normal.  Cardiovascular:     Rate and Rhythm: Normal rate.     Heart sounds: Normal heart sounds. No murmur heard. Pulmonary:     Effort: Pulmonary effort is normal. No respiratory distress.     Breath sounds: Normal breath sounds.  Abdominal:     General: Abdomen is flat.     Palpations: Abdomen is soft.     Tenderness: There is no right CVA tenderness or left CVA tenderness.  Musculoskeletal:        General: No swelling. Normal range of motion.  Skin:    General: Skin is warm and dry.  Neurological:     General: No focal deficit present.     Mental Status: He is alert.  Psychiatric:        Mood and Affect:  Mood normal.      UC Treatments / Results  Labs (all labs ordered are  listed, but only abnormal results are displayed) Labs Reviewed  POCT URINALYSIS DIP (MANUAL ENTRY) - Abnormal; Notable for the following components:      Result Value   Ketones, POC UA small (15) (*)    Blood, UA moderate (*)    All other components within normal limits    EKG   Radiology No results found.  Procedures Procedures (including critical care time)  Medications Ordered in UC Medications  ondansetron (ZOFRAN-ODT) disintegrating tablet 4 mg (has no administration in time range)  ketorolac (TORADOL) 30 MG/ML injection 30 mg (30 mg Intramuscular Given 03/20/23 0934)    Initial Impression / Assessment and Plan / UC Course  I have reviewed the triage vital signs and the nursing notes.  Pertinent labs & imaging results that were available during my care of the patient were reviewed by me and considered in my medical decision making (see chart for details).  Vitals and triage reviewed, patient is hemodynamically stable.  Left flank pain continues since Wednesday. Abdomen is soft and non-tender. Initially Toradol injection was helpful, will give again.  Has been taking Flomax.  Urine has improved since Wednesday, now has trace ketones and moderate red blood cells.  Discussed that we cannot confirm that it is a kidney stone, as we do not have the appropriate imaging available at this clinic.  Discussed red flag symptoms that would warrant emergent follow-up.  Encouraged to keep [facility] appointment that he is set for Tuesday.  Continue Flomax.  Patient verbalized understanding, no questions at this time.     Final Clinical Impressions(s) / UC Diagnoses   Final diagnoses:  Left flank pain  Nausea without vomiting     Discharge Instructions      Your symptoms could be caused by kidney stone.  Your urine does not show signs of infection. Please ensure you are taking the Flomax and drinking at  least 64 ounces of water daily.  We have given you a Toradol injection in clinic for your pain.  You can take the Zofran as needed for nausea.   Please seek immediate care at the nearest emergency department if you develop fever, vomiting, worsening flank pain, or inability to urinate.     ED Prescriptions     Medication Sig Dispense Auth. Provider   ondansetron (ZOFRAN-ODT) 4 MG disintegrating tablet Take 1 tablet (4 mg total) by mouth every 8 (eight) hours as needed for nausea or vomiting. 20 tablet Florella Mcneese, [place], [place]      PDMP not reviewed this encounter.   Rinaldo Ratel [place], [place] 03/20/23 [phone]

## 2023-03-20 NOTE — ED Triage Notes (Signed)
Pt c/o left flank pain that started back up this morning that is constant. Reports was seen here Wed for same pains that went away after he was discharged. Denies urinary problems.

## 2023-03-23 ENCOUNTER — Other Ambulatory Visit: Payer: Self-pay

## 2023-03-23 ENCOUNTER — Observation Stay (HOSPITAL_BASED_OUTPATIENT_CLINIC_OR_DEPARTMENT_OTHER): Payer: PPO | Admitting: Anesthesiology

## 2023-03-23 ENCOUNTER — Observation Stay (HOSPITAL_COMMUNITY)
Admission: EM | Admit: 2023-03-23 | Discharge: 2023-03-24 | Disposition: A | Discharge status: Home / Self Care | Payer: PPO | Attending: Internal Medicine | Admitting: Internal Medicine

## 2023-03-23 ENCOUNTER — Emergency Department (HOSPITAL_COMMUNITY): Payer: PPO

## 2023-03-23 ENCOUNTER — Observation Stay (HOSPITAL_COMMUNITY): Payer: PPO

## 2023-03-23 ENCOUNTER — Encounter (HOSPITAL_COMMUNITY)
Admission: EM | Disposition: A | Discharge status: Home / Self Care | Payer: Self-pay | Source: Home / Self Care | Attending: Emergency Medicine

## 2023-03-23 ENCOUNTER — Encounter (HOSPITAL_COMMUNITY): Payer: Self-pay

## 2023-03-23 ENCOUNTER — Observation Stay (HOSPITAL_COMMUNITY): Payer: PPO | Admitting: Anesthesiology

## 2023-03-23 DIAGNOSIS — Z79899 Other long term (current) drug therapy: Secondary | ICD-10-CM | POA: Insufficient documentation

## 2023-03-23 DIAGNOSIS — Z7722 Contact with and (suspected) exposure to environmental tobacco smoke (acute) (chronic): Secondary | ICD-10-CM | POA: Insufficient documentation

## 2023-03-23 DIAGNOSIS — N390 Urinary tract infection, site not specified: Secondary | ICD-10-CM | POA: Diagnosis not present

## 2023-03-23 DIAGNOSIS — N200 Calculus of kidney: Secondary | ICD-10-CM | POA: Diagnosis not present

## 2023-03-23 DIAGNOSIS — Z8546 Personal history of malignant neoplasm of prostate: Secondary | ICD-10-CM | POA: Diagnosis not present

## 2023-03-23 DIAGNOSIS — K5730 Diverticulosis of large intestine without perforation or abscess without bleeding: Secondary | ICD-10-CM | POA: Diagnosis not present

## 2023-03-23 DIAGNOSIS — N179 Acute kidney failure, unspecified: Secondary | ICD-10-CM

## 2023-03-23 DIAGNOSIS — R509 Fever, unspecified: Secondary | ICD-10-CM | POA: Diagnosis present

## 2023-03-23 DIAGNOSIS — I10 Essential (primary) hypertension: Secondary | ICD-10-CM

## 2023-03-23 DIAGNOSIS — F418 Other specified anxiety disorders: Secondary | ICD-10-CM

## 2023-03-23 DIAGNOSIS — N201 Calculus of ureter: Secondary | ICD-10-CM | POA: Diagnosis not present

## 2023-03-23 DIAGNOSIS — Z85828 Personal history of other malignant neoplasm of skin: Secondary | ICD-10-CM | POA: Diagnosis not present

## 2023-03-23 DIAGNOSIS — N132 Hydronephrosis with renal and ureteral calculous obstruction: Secondary | ICD-10-CM | POA: Insufficient documentation

## 2023-03-23 DIAGNOSIS — Z86718 Personal history of other venous thrombosis and embolism: Secondary | ICD-10-CM | POA: Insufficient documentation

## 2023-03-23 DIAGNOSIS — N135 Crossing vessel and stricture of ureter without hydronephrosis: Secondary | ICD-10-CM

## 2023-03-23 DIAGNOSIS — N281 Cyst of kidney, acquired: Secondary | ICD-10-CM | POA: Diagnosis not present

## 2023-03-23 DIAGNOSIS — Z7901 Long term (current) use of anticoagulants: Secondary | ICD-10-CM | POA: Insufficient documentation

## 2023-03-23 HISTORY — PX: CYSTOSCOPY/URETEROSCOPY/HOLMIUM LASER/STENT PLACEMENT: SHX6546

## 2023-03-23 LAB — URINALYSIS, ROUTINE W REFLEX MICROSCOPIC
Bilirubin Urine: NEGATIVE
Glucose, UA: NEGATIVE mg/dL
Hgb urine dipstick: NEGATIVE
Ketones, ur: 5 mg/dL — AB
Leukocytes,Ua: NEGATIVE
Nitrite: NEGATIVE
Protein, ur: NEGATIVE mg/dL
Specific Gravity, Urine: 1.012 (ref 1.005–1.030)
pH: 5 (ref 5.0–8.0)

## 2023-03-23 LAB — COMPREHENSIVE METABOLIC PANEL
ALT: 17 U/L (ref 0–44)
AST: 20 U/L (ref 15–41)
Albumin: 3 g/dL — ABNORMAL LOW (ref 3.5–5.0)
Alkaline Phosphatase: 63 U/L (ref 38–126)
Anion gap: 12 (ref 5–15)
BUN: 22 mg/dL (ref 8–23)
CO2: 21 mmol/L — ABNORMAL LOW (ref 22–32)
Calcium: 8.8 mg/dL — ABNORMAL LOW (ref 8.9–10.3)
Chloride: 101 mmol/L (ref 98–111)
Creatinine, Ser: 2.08 mg/dL — ABNORMAL HIGH (ref 0.61–1.24)
GFR, Estimated: 34 mL/min — ABNORMAL LOW (ref >60)
Glucose, Bld: 112 mg/dL — ABNORMAL HIGH (ref 70–99)
Potassium: 4.3 mmol/L (ref 3.5–5.1)
Sodium: 134 mmol/L — ABNORMAL LOW (ref 135–145)
Total Bilirubin: 0.7 mg/dL (ref 0.3–1.2)
Total Protein: 6.6 g/dL (ref 6.5–8.1)

## 2023-03-23 LAB — CBC WITH DIFFERENTIAL/PLATELET
Abs Immature Granulocytes: 0.05 10*3/uL (ref 0.00–0.07)
Basophils Absolute: 0 10*3/uL (ref 0.0–0.1)
Basophils Relative: 0 %
Eosinophils Absolute: 0 10*3/uL (ref 0.0–0.5)
Eosinophils Relative: 0 %
HCT: 43.3 % (ref 39.0–52.0)
Hemoglobin: 14.2 g/dL (ref 13.0–17.0)
Immature Granulocytes: 1 %
Lymphocytes Relative: 6 %
Lymphs Abs: 0.6 10*3/uL — ABNORMAL LOW (ref 0.7–4.0)
MCH: 31.7 pg (ref 26.0–34.0)
MCHC: 32.8 g/dL (ref 30.0–36.0)
MCV: 96.7 fL (ref 80.0–100.0)
Monocytes Absolute: 1 10*3/uL (ref 0.1–1.0)
Monocytes Relative: 10 %
Neutro Abs: 8.4 10*3/uL — ABNORMAL HIGH (ref 1.7–7.7)
Neutrophils Relative %: 83 %
Platelets: 187 10*3/uL (ref 150–400)
RBC: 4.48 MIL/uL (ref 4.22–5.81)
RDW: 12.9 % (ref 11.5–15.5)
WBC: 10 10*3/uL (ref 4.0–10.5)
nRBC: 0 % (ref 0.0–0.2)

## 2023-03-23 LAB — SURGICAL PCR SCREEN
MRSA, PCR: NEGATIVE
Staphylococcus aureus: NEGATIVE

## 2023-03-23 SURGERY — CYSTOSCOPY/URETEROSCOPY/HOLMIUM LASER/STENT PLACEMENT
Anesthesia: General | Laterality: Left

## 2023-03-23 MED ORDER — ATORVASTATIN CALCIUM 40 MG PO TABS: 40.0000 mg | ORAL_TABLET | Freq: Every day | ORAL | Status: DC

## 2023-03-23 MED ORDER — CHLORHEXIDINE GLUCONATE 0.12 % MT SOLN
15.0000 mL | Freq: Once | OROMUCOSAL | Status: AC
  Administered 2023-03-23: 15 mL via OROMUCOSAL

## 2023-03-23 MED ORDER — BUPROPION HCL ER (SR) 150 MG PO TB12: 150.0000 mg | ORAL_TABLET | Freq: Three times a day (TID) | ORAL | Status: DC

## 2023-03-23 MED ORDER — BUPROPION HCL ER (SR) 100 MG PO TB12
100.0000 mg | ORAL_TABLET | Freq: Two times a day (BID) | ORAL | Status: DC
  Administered 2023-03-23 – 2023-03-24 (×2): 100 mg via ORAL
  Filled 2023-03-23 (×4): qty 1

## 2023-03-23 MED ORDER — MORPHINE SULFATE (PF) 4 MG/ML IV SOLN
6.0000 mg | Freq: Once | INTRAVENOUS | Status: AC
  Administered 2023-03-23: 6 mg via INTRAVENOUS
  Filled 2023-03-23: qty 2

## 2023-03-23 MED ORDER — HYDROMORPHONE HCL 1 MG/ML IJ SOLN: 0.5000 mg | INTRAMUSCULAR | Status: DC | PRN

## 2023-03-23 MED ORDER — SODIUM CHLORIDE 0.9 % IV SOLN
INTRAVENOUS | Status: AC
  Administered 2023-03-23: 999 mL via INTRAVENOUS

## 2023-03-23 MED ORDER — DEXAMETHASONE SODIUM PHOSPHATE 10 MG/ML IJ SOLN
INTRAMUSCULAR | Status: DC | PRN
  Administered 2023-03-23: 10 mg via INTRAVENOUS

## 2023-03-23 MED ORDER — HYDROMORPHONE HCL 1 MG/ML IJ SOLN
0.5000 mg | INTRAMUSCULAR | Status: DC | PRN
  Administered 2023-03-23 – 2023-03-24 (×2): 0.5 mg via INTRAVENOUS
  Filled 2023-03-23 (×2): qty 0.5

## 2023-03-23 MED ORDER — IOHEXOL 300 MG/ML  SOLN
INTRAMUSCULAR | Status: DC | PRN
  Administered 2023-03-23: 4 mL

## 2023-03-23 MED ORDER — FENTANYL CITRATE (PF) 100 MCG/2ML IJ SOLN
INTRAMUSCULAR | Status: DC | PRN
  Administered 2023-03-23 (×2): 50 ug via INTRAVENOUS

## 2023-03-23 MED ORDER — LACTATED RINGERS IV BOLUS
1000.0000 mL | Freq: Once | INTRAVENOUS | Status: AC
  Administered 2023-03-23: 1000 mL via INTRAVENOUS

## 2023-03-23 MED ORDER — ONDANSETRON HCL 4 MG/2ML IJ SOLN
4.0000 mg | Freq: Once | INTRAMUSCULAR | Status: AC
  Administered 2023-03-23: 4 mg via INTRAVENOUS
  Filled 2023-03-23: qty 2

## 2023-03-23 MED ORDER — FENTANYL CITRATE (PF) 100 MCG/2ML IJ SOLN
INTRAMUSCULAR | Status: AC
  Filled 2023-03-23: qty 2

## 2023-03-23 MED ORDER — METOPROLOL SUCCINATE ER 100 MG PO TB24
100.0000 mg | ORAL_TABLET | Freq: Every day | ORAL | Status: DC
  Administered 2023-03-24: 100 mg via ORAL
  Filled 2023-03-23: qty 1

## 2023-03-23 MED ORDER — HEPARIN SODIUM (PORCINE) 5000 UNIT/ML IJ SOLN
5000.0000 [IU] | Freq: Three times a day (TID) | INTRAMUSCULAR | Status: DC
  Administered 2023-03-23 – 2023-03-24 (×2): 5000 [IU] via SUBCUTANEOUS
  Filled 2023-03-23 (×3): qty 1

## 2023-03-23 MED ORDER — DEXMEDETOMIDINE HCL IN NACL 80 MCG/20ML IV SOLN
INTRAVENOUS | Status: DC | PRN
  Administered 2023-03-23: 8 ug via INTRAVENOUS

## 2023-03-23 MED ORDER — PROPOFOL 500 MG/50ML IV EMUL
INTRAVENOUS | Status: DC | PRN
  Administered 2023-03-23: 150 mg via INTRAVENOUS

## 2023-03-23 MED ORDER — CLONAZEPAM 1 MG PO TABS
1.0000 mg | ORAL_TABLET | Freq: Two times a day (BID) | ORAL | Status: DC
  Administered 2023-03-23 – 2023-03-24 (×2): 1 mg via ORAL
  Filled 2023-03-23 (×2): qty 1

## 2023-03-23 MED ORDER — HYDROMORPHONE HCL 1 MG/ML IJ SOLN
0.5000 mg | INTRAMUSCULAR | Status: DC | PRN
  Administered 2023-03-23: 0.5 mg via INTRAVENOUS
  Filled 2023-03-23: qty 1

## 2023-03-23 MED ORDER — SODIUM CHLORIDE 0.9 % IV SOLN
2.0000 g | INTRAVENOUS | Status: DC
  Administered 2023-03-23: 2 g via INTRAVENOUS
  Filled 2023-03-23: qty 20

## 2023-03-23 MED ORDER — SODIUM CHLORIDE 0.9 % IR SOLN
Status: DC | PRN
  Administered 2023-03-23: 1000 mL via INTRAVESICAL

## 2023-03-23 MED ORDER — ACETAMINOPHEN 500 MG PO TABS
1000.0000 mg | ORAL_TABLET | Freq: Four times a day (QID) | ORAL | Status: DC | PRN
  Administered 2023-03-24: 1000 mg via ORAL
  Filled 2023-03-23 (×2): qty 2

## 2023-03-23 MED ORDER — LACTATED RINGERS IV SOLN: INTRAVENOUS | Status: DC

## 2023-03-23 MED ORDER — ONDANSETRON HCL 4 MG/2ML IJ SOLN: 4.0000 mg | Freq: Four times a day (QID) | INTRAMUSCULAR | Status: DC | PRN

## 2023-03-23 MED ORDER — TAMSULOSIN HCL 0.4 MG PO CAPS
0.4000 mg | ORAL_CAPSULE | Freq: Every day | ORAL | Status: DC
  Administered 2023-03-24: 0.4 mg via ORAL
  Filled 2023-03-23: qty 1

## 2023-03-23 MED ORDER — FENTANYL CITRATE PF 50 MCG/ML IJ SOSY: 25.0000 ug | PREFILLED_SYRINGE | INTRAMUSCULAR | Status: DC | PRN

## 2023-03-23 MED ORDER — LIDOCAINE 2% (20 MG/ML) 5 ML SYRINGE
INTRAMUSCULAR | Status: DC | PRN
  Administered 2023-03-23: 80 mg via INTRAVENOUS

## 2023-03-23 MED ORDER — ONDANSETRON HCL 4 MG/2ML IJ SOLN
INTRAMUSCULAR | Status: DC | PRN
  Administered 2023-03-23: 4 mg via INTRAVENOUS

## 2023-03-23 SURGICAL SUPPLY — 23 items
BAG URO CATCHER STRL LF (MISCELLANEOUS) ×1 IMPLANT
BASKET STONE NCOMPASS (UROLOGICAL SUPPLIES) IMPLANT
CATH URETERAL DUAL LUMEN 10F (MISCELLANEOUS) IMPLANT
CATH URETL OPEN 5X70 (CATHETERS) IMPLANT
CLOTH BEACON ORANGE TIMEOUT ST (SAFETY) ×1 IMPLANT
EXTRACTOR STONE NITINOL NGAGE (UROLOGICAL SUPPLIES) IMPLANT
GLOVE SURG SS PI 8.0 STRL IVOR (GLOVE) ×1 IMPLANT
GOWN STRL REUS W/ TWL XL LVL3 (GOWN DISPOSABLE) ×1 IMPLANT
GOWN STRL REUS W/TWL XL LVL3 (GOWN DISPOSABLE) ×1
GUIDEWIRE STR DUAL SENSOR (WIRE) ×1 IMPLANT
IV NS IRRIG 3000ML ARTHROMATIC (IV SOLUTION) ×1 IMPLANT
KIT TURNOVER KIT A (KITS) IMPLANT
LASER FIB FLEXIVA PULSE ID 365 (Laser) IMPLANT
LASER FIB FLEXIVA PULSE ID 550 (Laser) IMPLANT
LASER FIB FLEXIVA PULSE ID 910 (Laser) IMPLANT
MANIFOLD NEPTUNE II (INSTRUMENTS) ×1 IMPLANT
PACK CYSTO (CUSTOM PROCEDURE TRAY) ×1 IMPLANT
SHEATH NAVIGATOR HD 11/13X36 (SHEATH) IMPLANT
STENT POLARIS LOOP 6FR X 24 CM (STENTS) IMPLANT
TRACTIP FLEXIVA PULS ID 200XHI (Laser) IMPLANT
TRACTIP FLEXIVA PULSE ID 200 (Laser) ×1
TUBING CONNECTING 10 (TUBING) ×1 IMPLANT
TUBING UROLOGY SET (TUBING) ×1 IMPLANT

## 2023-03-23 NOTE — ED Notes (Signed)
Signature pad was broken. But got the verbal consent from patient that it was ok to initial transfer request for him. Read document so patient is aware of transfer to [place]

## 2023-03-23 NOTE — Plan of Care (Signed)
  Problem: Education: Goal: Knowledge of General Education information will improve Description Including pain rating scale, medication(s)/side effects and non-pharmacologic comfort measures Outcome: Progressing   Problem: Health Behavior/Discharge Planning: Goal: Ability to manage health-related needs will improve Outcome: Progressing   

## 2023-03-23 NOTE — Anesthesia Procedure Notes (Signed)
Procedure Name: Intubation Date/Time: 03/23/2023 7:17 PM  Performed by: Basilio Cairo, CRNAPre-anesthesia Checklist: Patient identified, Patient being monitored, Timeout performed, Emergency Drugs available and Suction available Patient Re-evaluated:Patient Re-evaluated prior to induction Oxygen Delivery Method: Circle system utilized Preoxygenation: Pre-oxygenation with 100% oxygen Induction Type: IV induction Ventilation: Mask ventilation without difficulty LMA Size: 4.0 Laryngoscope Size: Mac and 3 Grade View: Grade I Tube type: Oral Tube size: 7.0 mm Number of attempts: 1 Airway Equipment and Method: Stylet Placement Confirmation: ETT inserted through vocal cords under direct vision, positive ETCO2 and breath sounds checked- equal and bilateral Secured at: 21 cm Tube secured with: Tape Dental Injury: Teeth and Oropharynx as per pre-operative assessment

## 2023-03-23 NOTE — Consult Note (Signed)
Urology Consult  Referring physician: Dr. Doran Durand Reason for referral: ureteral stone  Chief Complaint: flank pain  History of Present Illness: Richard Delacruz is a 72 year old male presenting to [facility] emergency department for ongoing flank pain.  He has been seen by an urgent care on 6/12 and 6/14 respectively following a 4 mm left ureteral stone.  He was scheduled with urology tomorrow but was unable to make it and presented for acute pain management.  Patient is known to our practice and has been followed by Dr. Marlou Porch.  PMH significant for prostate cancer in 2011-s/p XRT, ADT for approximately 2 years in 2015, melanoma, and most recently diagnosed with lung cancer which she has beginning radiation for next week.  On my assessment patient was alert, oriented, and in moderate discomfort.  He was a good historian.  Process of ureteral stent and laser lithotripsy was discussed, as well as the possibility of percutaneous nephrostomy tube if necessary.  Patient is amenable to all of the above and last ate sometime yesterday.  Past Medical History:  Diagnosis Date   Anxiety    Arthritis    Cancer (HCC)    Prostate and melanoma   Depression    DVT (deep venous thrombosis) (HCC)    right lower leg   GERD (gastroesophageal reflux disease)    Hypertension    Past Surgical History:  Procedure Laterality Date   ADENOIDECTOMY     BRONCHIAL BIOPSY  01/26/2023   Procedure: BRONCHIAL BIOPSIES;  Surgeon: Leslye Peer, MD;  Location: [facility] ENDOSCOPY;  Service: Pulmonary;;   BRONCHIAL BRUSHINGS  01/26/2023   Procedure: BRONCHIAL BRUSHINGS;  Surgeon: Leslye Peer, MD;  Location: [facility] ENDOSCOPY;  Service: Pulmonary;;   BRONCHIAL NEEDLE ASPIRATION BIOPSY  01/26/2023   Procedure: BRONCHIAL NEEDLE ASPIRATION BIOPSIES;  Surgeon: Leslye Peer, MD;  Location: [facility] ENDOSCOPY;  Service: Pulmonary;;   COLONOSCOPY     TONSILLECTOMY     VIDEO BRONCHOSCOPY WITH RADIAL ENDOBRONCHIAL ULTRASOUND   01/26/2023   Procedure: VIDEO BRONCHOSCOPY WITH RADIAL ENDOBRONCHIAL ULTRASOUND;  Surgeon: Leslye Peer, MD;  Location: [facility] ENDOSCOPY;  Service: Pulmonary;;    Medications: I have reviewed the patient's current medications. Allergies:  Allergies  Allergen Reactions   Atacand [Candesartan] Anaphylaxis   Amlodipine     Swelling, joint and muscle pain    Monopril [Fosinopril] Cough   Naproxen Other (See Comments)    Abdominal pain    Niaspan [Niacin Er] Rash    History reviewed. No pertinent family history. Social History:  reports that he has never smoked. He has been exposed to tobacco smoke. He has never used smokeless tobacco. He reports that he does not drink alcohol and does not use drugs.  Review of Systems  Genitourinary:  Positive for flank pain.     Physical Exam:  Vital signs in last 24 hours: Temp:  [98.9 F (37.2 C)] 98.9 F (37.2 C) (06/17 0846) Pulse Rate:  [81] 81 (06/17 0846) Resp:  [18] 18 (06/17 0846) BP: (146)/(89) 146/89 (06/17 0846) SpO2:  [100 %] 100 % (06/17 0846) Weight:  [97.5 kg] 97.5 kg (06/17 0847)  Cardiovascular: Skin warm; not flushed Respiratory: Breaths quiet; no shortness of breath Abdomen: No masses, soft, no guarding or rebound Neurological: Normal sensation to touch Musculoskeletal: Normal motor function arms and legs Skin: No rashes   Laboratory Data:  Results for orders placed or performed during the hospital encounter of 03/23/23 (from the past 72 hour(s))  CBC with Differential  Status: Abnormal   Collection Time: 03/23/23  8:56 AM  Result Value Ref Range   WBC 10.0 4.0 - 10.5 K/uL   RBC 4.48 4.22 - 5.81 MIL/uL   Hemoglobin 14.2 13.0 - 17.0 g/dL   HCT 78.4 69.6 - 29.5 %   MCV 96.7 80.0 - 100.0 fL   MCH 31.7 26.0 - 34.0 pg   MCHC 32.8 30.0 - 36.0 g/dL   RDW 28.4 13.2 - 44.0 %   Platelets 187 150 - 400 K/uL   nRBC 0.0 0.0 - 0.2 %   Neutrophils Relative % 83 %   Neutro Abs 8.4 (H) 1.7 - 7.7 K/uL   Lymphocytes  Relative 6 %   Lymphs Abs 0.6 (L) 0.7 - 4.0 K/uL   Monocytes Relative 10 %   Monocytes Absolute 1.0 0.1 - 1.0 K/uL   Eosinophils Relative 0 %   Eosinophils Absolute 0.0 0.0 - 0.5 K/uL   Basophils Relative 0 %   Basophils Absolute 0.0 0.0 - 0.1 K/uL   Immature Granulocytes 1 %   Abs Immature Granulocytes 0.05 0.00 - 0.07 K/uL    Comment: Performed at [facility], [address], [place], [place] [address]  Comprehensive metabolic panel     Status: Abnormal   Collection Time: 03/23/23  8:56 AM  Result Value Ref Range   Sodium 134 (L) 135 - 145 mmol/L   Potassium 4.3 3.5 - 5.1 mmol/L   Chloride 101 98 - 111 mmol/L   CO2 21 (L) 22 - 32 mmol/L   Glucose, Bld 112 (H) 70 - 99 mg/dL    Comment: Glucose reference range applies only to samples taken after fasting for at least 8 hours.   BUN 22 8 - 23 mg/dL   Creatinine, Ser 5.36 (H) 0.61 - 1.24 mg/dL   Calcium 8.8 (L) 8.9 - 10.3 mg/dL   Total Protein 6.6 6.5 - 8.1 g/dL   Albumin 3.0 (L) 3.5 - 5.0 g/dL   AST 20 15 - 41 U/L   ALT 17 0 - 44 U/L   Alkaline Phosphatase 63 38 - 126 U/L   Total Bilirubin 0.7 0.3 - 1.2 mg/dL   GFR, Estimated 34 (L) >60 mL/min    Comment: (NOTE) Calculated using the CKD-EPI Creatinine Equation (2021)    Anion gap 12 5 - 15    Comment: Performed at [facility], [address] [place], [place] [address]   Recent Results (from the past 240 hour(s))  Urine Culture     Status: None   Collection Time: 03/18/23 12:38 PM   Specimen: Urine, Clean Catch  Result Value Ref Range Status   Specimen Description URINE, CLEAN CATCH  Final   Special Requests NONE  Final   Culture   Final    NO GROWTH Performed at [facility], [address] [place], [place] [address]    Report Status 03/19/2023 FINAL  Final   Creatinine: Recent Labs    03/23/23 0856  CREATININE 2.08*    Imaging: See report/chart  Independent review notes significant left side hydronephrosis to the level of obstructing 4-5  mm ureteral stone  Assessment/Plan:  Patient is normothermic with normal WBC.  Urinalysis unremarkable.  Not suspicious of systemic infectious process. Pain level around 9-10.  Having a hard time controlling with IV medications Serum creatinine 2.06.  Baseline around 1.10.  Patient reports that he has not been eating or drinking very much so could be dehydration versus legitimate kidney  injury.  Specific gravity does not show dehydration, but was collected's after IV fluid bolus. N.P.O. Transfer to [facility].  To the OR with Dr. Annabell Howells for ureteral stent placement.   Scherrie Bateman Anija Brickner 03/23/2023, 11:21 AM  Pager: [phone]

## 2023-03-23 NOTE — Op Note (Signed)
Procedure: 1.  Cystoscopy with left retrograde pyelogram with interpretation. 2.  Left ureteroscopy with holmium laser application,, stone extraction and insertion of left double-J stent. 3.  Application of fluoroscopy.  Preop diagnosis: 4 mm left proximal stone with AKI and pain.  Postop diagnosis: Same.  Surgeon: Dr. Bjorn Pippin.  Anesthesia: General.  Specimen: Stone fragments.  Drains: 6 French by 24 cm left contour double-J stent without tether.  EBL: None.  Complications: None.  Indications: The patient is a 72 year old male who presented to the ER with left flank pain and was found to have a 4 mm obstructing stone with AKI.  It was felt that cystoscopy with stent placement and possible ureteroscopy were indicated.  Procedure: He was taken the operating room was given antibiotic.  A general anesthetic was induced.  He was placed in lithotomy position and fitted with PAS hose.  His perineum and genitalia were prepped with Betadine solution and draped in usual sterile fashion.  Cystoscopy was performed using a 21 [place] scope and 30 degree lens.  Examination revealed a normal urethra.  The external sphincter was intact.  The prostatic urethra was short without obstruction but there was radiation changes with neovascularity.  Similar radiation neovascularity was noted in the bladder which was otherwise smooth wall.  Ureteral orifices were somewhat delicate.  The left ureteral orifice was initially attempted to be cannulated with a 5 [place] open-ended catheter but this was unsuccessful due to a stenosis.  I then used a sensor wire through the open-ended catheter to successfully cannulate the ureter.  The open-ended catheter was removed and Omnipaque was instilled for retrograde.  Retrograde pyelogram reveals a somewhat delicate but otherwise normal caliber ureter up to a filling defect in the proximal ureter consistent with a stone with hydronephrosis above that.  The sensor wire was  then advanced the kidney by the stone and the open-ended catheter was removed.  The 37 French inner core of a 36 cm digital access sheath was then passed over the wire and I was able to successfully get up to the level of the stone.  This was then followed by the assembled sheath.  Once the sheath was in position the inner core and wire removed.  A single-lumen digital flexible scope was then advanced to the kidney through the sheath and the stone was identified.  The stone was then fragmented using the 242 m laser fiber with 0.3 J and 60 Hz on the left pedal and 0.8 J and 10 hz on the right pedal.  The stone readily fragmented.  The fragments were then removed using an engage basket.  Once all significant fragments had been removed, a sensor wire was passed the ureteroscope and the ureteroscope was backed out leaving the wire in place.  The cystoscope was then reinserted over the wire and a 6 [place] by 24 cm contour double-J stent without tether was passed the kidney under fluoroscopic guidance.  The wire was removed, a good coil in the kidney and a good coil in the bladder.  The bladder was drained and the cystoscope was removed.   He was taken down from lithotomy position, his anesthetic was reversed and he was moved recovery in stable condition.  There were no complications.  Stone fragments be given to the patient to bring to the office for analysis.

## 2023-03-23 NOTE — Anesthesia Preprocedure Evaluation (Signed)
Anesthesia Evaluation  Patient identified by MRN, date of birth, ID band Patient awake    Reviewed: Allergy & Precautions, NPO status , Patient's Chart, lab work & pertinent test results  Airway Mallampati: II  TM Distance: >3 FB Neck ROM: Full    Dental  (+) Dental Advisory Given, Chipped, Missing, Poor Dentition   Pulmonary neg pulmonary ROS   Pulmonary exam normal breath sounds clear to auscultation       Cardiovascular hypertension, Pt. on medications Normal cardiovascular exam Rhythm:Regular Rate:Normal     Neuro/Psych  PSYCHIATRIC DISORDERS Anxiety Depression    negative neurological ROS     GI/Hepatic Neg liver ROS,GERD  Controlled,,  Endo/Other  negative endocrine ROS    Renal/GU negative Renal ROS     Musculoskeletal  (+) Arthritis ,    Abdominal  (+) + obese  Peds  Hematology negative hematology ROS (+)   Anesthesia Other Findings Adenocarcinoma of right lung  Reproductive/Obstetrics                             Anesthesia Physical Anesthesia Plan  ASA: 3  Anesthesia Plan: General   Post-op Pain Management: Minimal or no pain anticipated, Tylenol PO (pre-op)* and Celebrex PO (pre-op)*   Induction: Intravenous  PONV Risk Score and Plan: 2 and Ondansetron, Dexamethasone, Treatment may vary due to age or medical condition, TIVA and Propofol infusion  Airway Management Planned: Oral ETT and LMA  Additional Equipment:   Intra-op Plan:   Post-operative Plan: Extubation in OR  Informed Consent: I have reviewed the patients History and Physical, chart, labs and discussed the procedure including the risks, benefits and alternatives for the proposed anesthesia with the patient or authorized representative who has indicated his/her understanding and acceptance.     Dental advisory given  Plan Discussed with: CRNA  Anesthesia Plan Comments:        Anesthesia Quick  Evaluation

## 2023-03-23 NOTE — Discharge Instructions (Signed)
Please bring the stone fragments to the office at f/u.

## 2023-03-23 NOTE — Transfer of Care (Signed)
Immediate Anesthesia Transfer of Care Note  Patient: Richard Delacruz  Procedure(s) Performed: Procedure(s): CYSTOSCOPY/URETEROSCOPY/HOLMIUM LASER/STENT PLACEMENT (Left)  Patient Location: PACU  Anesthesia Type:General  Level of Consciousness: Alert, Awake, Oriented  Airway & Oxygen Therapy: Patient Spontanous Breathing  Post-op Assessment: Report given to RN  Post vital signs: Reviewed and stable  Last Vitals:  Vitals:   03/23/23 1458 03/23/23 1559  BP: (!) 143/82 (!) 144/82  Pulse: 66 78  Resp: 15 16  Temp: 36.9 C 37.1 C  SpO2: 100% 97%    Complications: No apparent anesthesia complications

## 2023-03-23 NOTE — ED Notes (Signed)
ED TO INPATIENT HANDOFF REPORT  ED Nurse Name and Phone #: Braedan Meuth [phone]  S Name/Age/Gender Richard Delacruz 72 y.o. male Room/Bed: 004C/004C  Code Status   Code Status: Full Code  Home/SNF/Other Home Patient oriented to: self, place, time, and situation Is this baseline? Yes   Triage Complete: Triage complete  Chief Complaint Ureter obstruction [N13.5]  Triage Note Patient came in by POC today for kidney stones. Patient states he went to the urgent care on Wednesday and thursday last week. He states they told him it was noted in the left kidney. Patient states he is unable to pass the stone. He has an appointment tomorrow with Urology but he states the pain is too bad. Patient states he is unable to sleep due to the pain    Allergies Allergies  Allergen Reactions   Atacand [Candesartan] Anaphylaxis   Amlodipine     Swelling, joint and muscle pain    Monopril [Fosinopril] Cough   Naproxen Other (See Comments)    Abdominal pain    Niaspan [Niacin Er] Rash    Level of Care/Admitting Diagnosis ED Disposition     ED Disposition  Admit   Condition  --   Comment  Hospital Area: [facility] [[id number]]  Level of Care: Med-Surg [16]  May place patient in observation at [facility] or [facility] if equivalent level of care is available:: No  Covid Evaluation: Asymptomatic - no recent exposure (last 10 days) testing not required  Diagnosis: Ureter obstruction [207041]  Admitting Physician: Emeline General [[id number]]  Attending Physician: Emeline General [[id number]]          B Medical/Surgery History Past Medical History:  Diagnosis Date   Anxiety    Arthritis    Cancer (HCC)    Prostate and melanoma   Depression    DVT (deep venous thrombosis) (HCC)    right lower leg   GERD (gastroesophageal reflux disease)    Hypertension    Past Surgical History:  Procedure Laterality Date   ADENOIDECTOMY     BRONCHIAL BIOPSY  01/26/2023    Procedure: BRONCHIAL BIOPSIES;  Surgeon: Leslye Peer, MD;  Location: [facility] ENDOSCOPY;  Service: Pulmonary;;   BRONCHIAL BRUSHINGS  01/26/2023   Procedure: BRONCHIAL BRUSHINGS;  Surgeon: Leslye Peer, MD;  Location: [facility] ENDOSCOPY;  Service: Pulmonary;;   BRONCHIAL NEEDLE ASPIRATION BIOPSY  01/26/2023   Procedure: BRONCHIAL NEEDLE ASPIRATION BIOPSIES;  Surgeon: Leslye Peer, MD;  Location: [facility] ENDOSCOPY;  Service: Pulmonary;;   COLONOSCOPY     TONSILLECTOMY     VIDEO BRONCHOSCOPY WITH RADIAL ENDOBRONCHIAL ULTRASOUND  01/26/2023   Procedure: VIDEO BRONCHOSCOPY WITH RADIAL ENDOBRONCHIAL ULTRASOUND;  Surgeon: Leslye Peer, MD;  Location: [facility] ENDOSCOPY;  Service: Pulmonary;;     A IV Location/Drains/Wounds Patient Lines/Drains/Airways Status     Active Line/Drains/Airways     Name Placement date Placement time Site Days   Peripheral IV 03/23/23 20 G Left Wrist 03/23/23  0922  Wrist  less than 1            Intake/Output Last 24 hours  Intake/Output Summary (Last 24 hours) at 03/23/2023 1332 Last data filed at 03/23/2023 1107 Gross per 24 hour  Intake 1000 ml  Output --  Net 1000 ml    Labs/Imaging Results for orders placed or performed during the hospital encounter of 03/23/23 (from the past 48 hour(s))  CBC with Differential     Status: Abnormal   Collection Time: 03/23/23  8:56 AM  Result Value Ref Range   WBC 10.0 4.0 - 10.5 K/uL   RBC 4.48 4.22 - 5.81 MIL/uL   Hemoglobin 14.2 13.0 - 17.0 g/dL   HCT 16.1 09.6 - 04.5 %   MCV 96.7 80.0 - 100.0 fL   MCH 31.7 26.0 - 34.0 pg   MCHC 32.8 30.0 - 36.0 g/dL   RDW 40.9 81.1 - 91.4 %   Platelets 187 150 - 400 K/uL   nRBC 0.0 0.0 - 0.2 %   Neutrophils Relative % 83 %   Neutro Abs 8.4 (H) 1.7 - 7.7 K/uL   Lymphocytes Relative 6 %   Lymphs Abs 0.6 (L) 0.7 - 4.0 K/uL   Monocytes Relative 10 %   Monocytes Absolute 1.0 0.1 - 1.0 K/uL   Eosinophils Relative 0 %   Eosinophils Absolute 0.0 0.0 - 0.5 K/uL   Basophils Relative 0 %    Basophils Absolute 0.0 0.0 - 0.1 K/uL   Immature Granulocytes 1 %   Abs Immature Granulocytes 0.05 0.00 - 0.07 K/uL    Comment: Performed at [facility], [address], [place], [place] [address]  Comprehensive metabolic panel     Status: Abnormal   Collection Time: 03/23/23  8:56 AM  Result Value Ref Range   Sodium 134 (L) 135 - 145 mmol/L   Potassium 4.3 3.5 - 5.1 mmol/L   Chloride 101 98 - 111 mmol/L   CO2 21 (L) 22 - 32 mmol/L   Glucose, Bld 112 (H) 70 - 99 mg/dL    Comment: Glucose reference range applies only to samples taken after fasting for at least 8 hours.   BUN 22 8 - 23 mg/dL   Creatinine, Ser 6.21 (H) 0.61 - 1.24 mg/dL   Calcium 8.8 (L) 8.9 - 10.3 mg/dL   Total Protein 6.6 6.5 - 8.1 g/dL   Albumin 3.0 (L) 3.5 - 5.0 g/dL   AST 20 15 - 41 U/L   ALT 17 0 - 44 U/L   Alkaline Phosphatase 63 38 - 126 U/L   Total Bilirubin 0.7 0.3 - 1.2 mg/dL   GFR, Estimated 34 (L) >60 mL/min    Comment: (NOTE) Calculated using the CKD-EPI Creatinine Equation (2021)    Anion gap 12 5 - 15    Comment: Performed at [facility], [address] [place], [place] [address]  Urinalysis, Routine w reflex microscopic -Urine, Clean Catch     Status: Abnormal   Collection Time: 03/23/23 11:36 AM  Result Value Ref Range   Color, Urine YELLOW YELLOW   APPearance CLEAR CLEAR   Specific Gravity, Urine 1.012 1.005 - 1.030   pH 5.0 5.0 - 8.0   Glucose, UA NEGATIVE NEGATIVE mg/dL   Hgb urine dipstick NEGATIVE NEGATIVE   Bilirubin Urine NEGATIVE NEGATIVE   Ketones, ur 5 (A) NEGATIVE mg/dL   Protein, ur NEGATIVE NEGATIVE mg/dL   Nitrite NEGATIVE NEGATIVE   Leukocytes,Ua NEGATIVE NEGATIVE    Comment: Performed at [facility], [address], [place], [place] [address]   CT Renal Stone Study  Result Date: 03/23/2023 CLINICAL DATA:  Urolithiasis, symptomatic EXAM: CT ABDOMEN AND PELVIS WITHOUT CONTRAST TECHNIQUE: Multidetector CT imaging of the abdomen and pelvis was performed  following the standard protocol without IV contrast. RADIATION DOSE REDUCTION: This exam was performed according to the departmental dose-optimization program which includes automated exposure control, adjustment of the mA and/or kV according to patient size and/or use of iterative reconstruction  technique. COMPARISON:  PET CT 02/12/23 FINDINGS: Lower chest: Lung bases are clear. Note that the assessment of the abdominal and pelvic solid organs is limited in the absence of IV contrast Hepatobiliary: Liver has a normal contour. No perihepatic fluid. No evidence of cholelithiasis or cholecystitis. Pancreas: No evidence of peripancreatic fat stranding to suggest pancreatitis. Spleen: Normal in size without focal abnormality. Adrenals/Urinary Tract: Bilateral adrenal glands are in appearance. The right kidney without evidence of hydronephrosis or nephrolithiasis. There is moderate to severe left-sided hydronephrosis secondary to an obstructing 4 mm stone in the proximal left ureter. Additional small nonobstructing renal stone is also so seen in the upper pole of the left kidney redemonstrated is a hypodense posteriorly projecting renal cyst of the upper pole of the left kidney. Urinary bladder is fluid-filled without focal abnormality. No free fluid in the pelvis. Stomach/Bowel: No evidence of bowel obstruction. There is diverticulosis without evidence of diverticulitis. Appendix is normal in appearance. Vascular/Lymphatic: Aortic atherosclerosis. No enlarged abdominal or pelvic lymph nodes. Reproductive: Prostate is unremarkable. Other: No abdominal wall hernia or abnormality. No abdominopelvic ascites. Musculoskeletal: No fracture is seen. IMPRESSION: Moderate to severe left-sided hydronephrosis secondary to an obstructing 4 mm stone in the proximal left ureter. Aortic Atherosclerosis (ICD10-I70.0). Electronically Signed   By: Lorenza Cambridge M.D.   On: 03/23/2023 10:42    Pending Labs Unresulted Labs (From admission,  onward)     Start     Ordered   03/24/23 0500  CBC  Tomorrow morning,   R        03/23/23 1306   03/24/23 0500  Basic metabolic panel  Tomorrow morning,   R        03/23/23 1306   03/23/23 1308  Culture, blood (Routine X 2) w Reflex to ID Panel  BLOOD CULTURE X 2,   R (with TIMED occurrences)      03/23/23 1307   03/23/23 1305  HIV Antibody (routine testing w rflx)  (HIV Antibody (Routine testing w reflex) panel)  Once,   R        03/23/23 1306            Vitals/Pain Today's Vitals   03/23/23 0846 03/23/23 0847 03/23/23 1108 03/23/23 1158  BP: (!) 146/89   (!) 140/85  Pulse: 81   80  Resp: 18   18  Temp: 98.9 F (37.2 C)   98.9 F (37.2 C)  TempSrc: Oral   Oral  SpO2: 100%   100%  Weight:  97.5 kg    Height:  5\' 9"  (1.753 m)    PainSc:  9  7      Isolation Precautions No active isolations  Medications Medications  acetaminophen (TYLENOL) tablet 1,000 mg (has no administration in time range)  atorvastatin (LIPITOR) tablet 40 mg (has no administration in time range)  metoprolol succinate (TOPROL-XL) 24 hr tablet 100 mg (has no administration in time range)  buPROPion (WELLBUTRIN SR) 12 hr tablet 150 mg (has no administration in time range)  tamsulosin (FLOMAX) capsule 0.4 mg (has no administration in time range)  clonazePAM (KLONOPIN) tablet 1 mg (has no administration in time range)  ondansetron (ZOFRAN) injection 4 mg (has no administration in time range)  0.9 %  sodium chloride infusion (has no administration in time range)  heparin injection 5,000 Units (has no administration in time range)  cefTRIAXone (ROCEPHIN) 2 g in sodium chloride 0.9 % 100 mL IVPB (has no administration in time range)  lactated ringers bolus 1,000  mL (0 mLs Intravenous Stopped 03/23/23 1107)  morphine (PF) 4 MG/ML injection 6 mg (6 mg Intravenous Given 03/23/23 0927)  ondansetron (ZOFRAN) injection 4 mg (4 mg Intravenous Given 03/23/23 2956)    Mobility walks     Focused  Assessments    R Recommendations: See Admitting Provider Note  Report given to:   Additional Notes:

## 2023-03-23 NOTE — H&P (Signed)
History and Physical    Richard Delacruz NWG:[id number] DOB: 05/23/1951 DOA: 03/23/2023  PCP: Sigmund Hazel, MD (Confirm with patient/family/NH records and if not entered, this has to be entered at [facility] point of entry) Patient coming from: Home  I have personally briefly reviewed patient's old medical records in [facility]  Chief Complaint: Left flank pain, fever  HPI: Richard Delacruz is a 72 y.o. male with medical history significant of prostate cancer status post radiation and hormone therapy, DVT on Xarelto, HTN, right arm melanoma, anxiety/depression HLD, presented with persistent left flank pain and fever.  Symptoms started 1 week ago, with new onset of left flank pain, sharp, associated with nauseous vomiting went to see urgent care initially on 6/12 and study found deformity improved left obstructive ureteral stone and patient was reassured and discharged home with Flomax and pain medications.  Over the last 3 days, symptoms has become persistent associated with worsening of left flank pain and fever Tmax 101.0 for last 3 days for which patient has been taking rhonchal Tylenol, and nauseous vomiting became worse and not able to eat or drink much since yesterday and became very dehydrated.  Denies any dysuria no chest pain or shortness of breath.  ED Course: Temperature 98.9, no tachycardia nonhypotensive.  Repeat CT renal stone study showed obstructive left ureteral stone with moderate left hydronephrosis.  Blood work showed AKI with creatinine 2.2 compared to baseline of 1.1, WBC 10.0, K4.3 bicarb 21.  Review of Systems: As per HPI otherwise 14 point review of systems negative.    Past Medical History:  Diagnosis Date   Anxiety    Arthritis    Cancer (HCC)    Prostate and melanoma   Depression    DVT (deep venous thrombosis) (HCC)    right lower leg   GERD (gastroesophageal reflux disease)    Hypertension     Past Surgical History:  Procedure Laterality Date    ADENOIDECTOMY     BRONCHIAL BIOPSY  01/26/2023   Procedure: BRONCHIAL BIOPSIES;  Surgeon: Leslye Peer, MD;  Location: [facility] ENDOSCOPY;  Service: Pulmonary;;   BRONCHIAL BRUSHINGS  01/26/2023   Procedure: BRONCHIAL BRUSHINGS;  Surgeon: Leslye Peer, MD;  Location: [facility] ENDOSCOPY;  Service: Pulmonary;;   BRONCHIAL NEEDLE ASPIRATION BIOPSY  01/26/2023   Procedure: BRONCHIAL NEEDLE ASPIRATION BIOPSIES;  Surgeon: Leslye Peer, MD;  Location: [facility] ENDOSCOPY;  Service: Pulmonary;;   COLONOSCOPY     TONSILLECTOMY     VIDEO BRONCHOSCOPY WITH RADIAL ENDOBRONCHIAL ULTRASOUND  01/26/2023   Procedure: VIDEO BRONCHOSCOPY WITH RADIAL ENDOBRONCHIAL ULTRASOUND;  Surgeon: Leslye Peer, MD;  Location: [facility] ENDOSCOPY;  Service: Pulmonary;;     reports that he has never smoked. He has been exposed to tobacco smoke. He has never used smokeless tobacco. He reports that he does not drink alcohol and does not use drugs.  Allergies  Allergen Reactions   Atacand [Candesartan] Anaphylaxis   Amlodipine     Swelling, joint and muscle pain    Monopril [Fosinopril] Cough   Naproxen Other (See Comments)    Abdominal pain    Niaspan [Niacin Er] Rash    History reviewed. No pertinent family history.  Prior to Admission medications   Medication Sig Start Date End Date Taking? Authorizing Provider  acetaminophen (TYLENOL) 500 MG tablet Take 1,000 mg by mouth every 6 (six) hours as needed for moderate pain.    [provider]  atorvastatin (LIPITOR) 40 MG tablet Take 40 mg by mouth  daily. 02/13/16   [provider]  buPROPion (WELLBUTRIN SR) 150 MG 12 hr tablet Take 150 mg by mouth 3 (three) times daily. 04/25/14   [provider]  Calcium Citrate-Vitamin D (CALCIUM + D PO) Take 2 tablets by mouth daily.    [provider]  clonazePAM (KLONOPIN) 1 MG tablet Take 1 mg by mouth 2 (two) times daily.  05/26/14   [provider]  diphenhydramine-acetaminophen (TYLENOL PM) 25-500 MG  TABS tablet Take 2 tablets by mouth at bedtime.    [provider]  lamoTRIgine (LAMICTAL) 100 MG tablet Take 100 mg by mouth 2 (two) times daily. 03/06/14   [provider]  metoprolol succinate (TOPROL-XL) 100 MG 24 hr tablet Take 100 mg by mouth daily. Take with or immediately following a meal.    [provider]  Multiple Vitamin (MULTIVITAMIN WITH MINERALS) TABS tablet Take 1 tablet by mouth daily.    [provider]  ondansetron (ZOFRAN-ODT) 4 MG disintegrating tablet Take 1 tablet (4 mg total) by mouth every 8 (eight) hours as needed for nausea or vomiting. 03/20/23   Garrison, [place] N, FNP  rivaroxaban (XARELTO) 10 MG TABS tablet Take 1 tablet (10 mg total) by mouth daily. 01/28/23   Leslye Peer, MD  tamsulosin (FLOMAX) 0.4 MG CAPS capsule Take 1 capsule (0.4 mg total) by mouth daily. 03/18/23   Carlisle Beers, FNP    Physical Exam: Vitals:   03/23/23 0846 03/23/23 0847 03/23/23 1158  BP: (!) 146/89  (!) 140/85  Pulse: 81  80  Resp: 18  18  Temp: 98.9 F (37.2 C)  98.9 F (37.2 C)  TempSrc: Oral  Oral  SpO2: 100%  100%  Weight:  97.5 kg   Height:  5\' 9"  (1.753 m)     Constitutional: NAD, calm, comfortable Vitals:   03/23/23 0846 03/23/23 0847 03/23/23 1158  BP: (!) 146/89  (!) 140/85  Pulse: 81  80  Resp: 18  18  Temp: 98.9 F (37.2 C)  98.9 F (37.2 C)  TempSrc: Oral  Oral  SpO2: 100%  100%  Weight:  97.5 kg   Height:  5\' 9"  (1.753 m)    Eyes: PERRL, lids and conjunctivae normal ENMT: Mucous membranes are dry. Posterior pharynx clear of any exudate or lesions.Normal dentition.  Neck: normal, supple, no masses, no thyromegaly Respiratory: clear to auscultation bilaterally, no wheezing, no crackles. Normal respiratory effort. No accessory muscle use.  Cardiovascular: Regular rate and rhythm, no murmurs / rubs / gallops. No extremity edema. 2+ pedal pulses. No carotid bruits.  Abdomen: Left CVA tenderness, no masses  palpated. No hepatosplenomegaly. Bowel sounds positive.  Musculoskeletal: no clubbing / cyanosis. No joint deformity upper and lower extremities. Good ROM, no contractures. Normal muscle tone.  Skin: no rashes, lesions, ulcers. No induration Neurologic: CN 2-12 grossly intact. Sensation intact, DTR normal. Strength 5/5 in all 4.  Psychiatric: Normal judgment and insight. Alert and oriented x 3. Normal mood.     Labs on Admission: I have personally reviewed following labs and imaging studies  CBC: Recent Labs  Lab 03/23/23 0856  WBC 10.0  NEUTROABS 8.4*  HGB 14.2  HCT 43.3  MCV 96.7  PLT 187   Basic Metabolic Panel: Recent Labs  Lab 03/23/23 0856  NA 134*  K 4.3  CL 101  CO2 21*  GLUCOSE 112*  BUN 22  CREATININE 2.08*  CALCIUM 8.8*   GFR: Estimated Creatinine Clearance: 38 mL/min (A) (by C-G  formula based on SCr of 2.08 mg/dL (H)). Liver Function Tests: Recent Labs  Lab 03/23/23 0856  AST 20  ALT 17  ALKPHOS 63  BILITOT 0.7  PROT 6.6  ALBUMIN 3.0*   No results for input(s): "LIPASE", "AMYLASE" in the last 168 hours. No results for input(s): "AMMONIA" in the last 168 hours. Coagulation Profile: No results for input(s): "INR", "PROTIME" in the last 168 hours. Cardiac Enzymes: No results for input(s): "CKTOTAL", "CKMB", "CKMBINDEX", "TROPONINI" in the last 168 hours. BNP (last 3 results) No results for input(s): "PROBNP" in the last 8760 hours. HbA1C: No results for input(s): "HGBA1C" in the last 72 hours. CBG: No results for input(s): "GLUCAP" in the last 168 hours. Lipid Profile: No results for input(s): "CHOL", "HDL", "LDLCALC", "TRIG", "CHOLHDL", "LDLDIRECT" in the last 72 hours. Thyroid Function Tests: No results for input(s): "TSH", "T4TOTAL", "FREET4", "T3FREE", "THYROIDAB" in the last 72 hours. Anemia Panel: No results for input(s): "VITAMINB12", "FOLATE", "FERRITIN", "TIBC", "IRON", "RETICCTPCT" in the last 72 hours. Urine analysis:    Component  Value Date/Time   COLORURINE YELLOW 03/23/2023 1136   APPEARANCEUR CLEAR 03/23/2023 1136   LABSPEC 1.012 03/23/2023 1136   PHURINE 5.0 03/23/2023 1136   GLUCOSEU NEGATIVE 03/23/2023 1136   HGBUR NEGATIVE 03/23/2023 1136   BILIRUBINUR NEGATIVE 03/23/2023 1136   BILIRUBINUR negative 03/20/2023 0941   KETONESUR 5 (A) 03/23/2023 1136   PROTEINUR NEGATIVE 03/23/2023 1136   UROBILINOGEN 0.2 03/20/2023 0941   NITRITE NEGATIVE 03/23/2023 1136   LEUKOCYTESUR NEGATIVE 03/23/2023 1136    Radiological Exams on Admission: CT Renal Stone Study  Result Date: 03/23/2023 CLINICAL DATA:  Urolithiasis, symptomatic EXAM: CT ABDOMEN AND PELVIS WITHOUT CONTRAST TECHNIQUE: Multidetector CT imaging of the abdomen and pelvis was performed following the standard protocol without IV contrast. RADIATION DOSE REDUCTION: This exam was performed according to the departmental dose-optimization program which includes automated exposure control, adjustment of the mA and/or kV according to patient size and/or use of iterative reconstruction technique. COMPARISON:  PET CT 02/12/23 FINDINGS: Lower chest: Lung bases are clear. Note that the assessment of the abdominal and pelvic solid organs is limited in the absence of IV contrast Hepatobiliary: Liver has a normal contour. No perihepatic fluid. No evidence of cholelithiasis or cholecystitis. Pancreas: No evidence of peripancreatic fat stranding to suggest pancreatitis. Spleen: Normal in size without focal abnormality. Adrenals/Urinary Tract: Bilateral adrenal glands are in appearance. The right kidney without evidence of hydronephrosis or nephrolithiasis. There is moderate to severe left-sided hydronephrosis secondary to an obstructing 4 mm stone in the proximal left ureter. Additional small nonobstructing renal stone is also so seen in the upper pole of the left kidney redemonstrated is a hypodense posteriorly projecting renal cyst of the upper pole of the left kidney. Urinary bladder  is fluid-filled without focal abnormality. No free fluid in the pelvis. Stomach/Bowel: No evidence of bowel obstruction. There is diverticulosis without evidence of diverticulitis. Appendix is normal in appearance. Vascular/Lymphatic: Aortic atherosclerosis. No enlarged abdominal or pelvic lymph nodes. Reproductive: Prostate is unremarkable. Other: No abdominal wall hernia or abnormality. No abdominopelvic ascites. Musculoskeletal: No fracture is seen. IMPRESSION: Moderate to severe left-sided hydronephrosis secondary to an obstructing 4 mm stone in the proximal left ureter. Aortic Atherosclerosis (ICD10-I70.0). Electronically Signed   By: Lorenza Cambridge M.D.   On: 03/23/2023 10:42    EKG: Independently reviewed.  Sinus rhythm, no acute ST changes.  Assessment/Plan Principal Problem:   Ureter obstruction Active Problems:   Obstruction of left ureter   Fever  (  please populate well all problems here in Problem List. (For example, if patient is on BP meds at home and you resume or decide to hold them, it is a problem that needs to be her. Same for CAD, COPD, HLD and so on)  Acute left obstructive uropathy -Secondary to left ureter obstructive stone.  Urology consultation appreciated, plan is for patient to transfer to [facility] for left ureter stent placement this afternoon -Continue Flomax  AKI -Prerenal secondary to left obstructive uropathy, management as above -Clinically appears to be volume contracted, start IV fluid  Complicated UTI Fever -Evidenced by persistent fever and left-sided obstructive uropathy, UA probably less representative of left-sided obstructed urine -Given there is a persistent fever and urine obstruction, decided to start IV ceftriaxone.  Blood cultures x 2 sent  HTN -Continue metoprolol  History of DVT, unprovoked -Hold off Xarelto -Chemical DVT prophylaxis   DVT prophylaxis: Heparin subcu Code Status: Full code Family Communication: None at bedside Disposition  Plan: Expect less than 2 midnight hospital stay Consults called: Urology Admission status: MedSurg observation   Emeline General MD [facility] Pager [phone] 03/23/2023, 1:07 PM

## 2023-03-23 NOTE — ED Provider Notes (Signed)
[place] EMERGENCY DEPARTMENT AT [facility] Provider Note   CSN: [id number] Arrival date & time: 03/23/23  0831     History No chief complaint on file.   HPI Richard Delacruz is a 72 y.o. male presenting for complaint of left-sided flank pain.  It has been present for 7 days.  It has been progressive in nature.  Met relatively minimal medical history.  Today he started having difficulty with urination as well.  Denies fevers chills nausea vomiting syncope or shortness of breath.  Severe pain that has been progressive over the last 72 hours in particular.   Patient's recorded medical, surgical, social, medication list and allergies were reviewed in the Snapshot window as part of the initial history.   Review of Systems   Review of Systems  Genitourinary:  Positive for flank pain.    Physical Exam Updated Vital Signs There were no vitals taken for this visit. Physical Exam Vitals and nursing note reviewed.  Constitutional:      General: He is not in acute distress.    Appearance: He is well-developed.  HENT:     Head: Normocephalic and atraumatic.  Eyes:     Conjunctiva/sclera: Conjunctivae normal.  Cardiovascular:     Rate and Rhythm: Normal rate and regular rhythm.     Heart sounds: No murmur heard. Pulmonary:     Effort: Pulmonary effort is normal. No respiratory distress.     Breath sounds: Normal breath sounds.  Abdominal:     Palpations: Abdomen is soft.     Tenderness: There is no abdominal tenderness. There is left CVA tenderness.  Musculoskeletal:        General: No swelling.     Cervical back: Neck supple.  Skin:    General: Skin is warm and dry.     Capillary Refill: Capillary refill takes less than 2 seconds.  Neurological:     Mental Status: He is alert.  Psychiatric:        Mood and Affect: Mood normal.      ED Course/ Medical Decision Making/ A&P    Procedures Procedures   Medications Ordered in ED Medications - No data to  display Medical Decision Making:   MECCA PUFF is a 72 y.o. male who presented to the ED today with left sided flank pain, detailed above.    Patient's presentation is complicated by their history of multiple comorbid medical problems.  Patient placed on continuous vitals and telemetry monitoring while in ED which was reviewed periodically.  Complete initial physical exam performed, notably the patient  was HDS in NAD.     Reviewed and confirmed nursing documentation for past medical history, family history, social history.    Initial Assessment:   With the patient's presentation of flank pain, most likely diagnosis is Urologic pathology (including UL vs UTI vs pyelo) vs MSK etiology. Other diagnoses were considered including (but not limited to) gastroenteritis, colitis, small bowel obstruction, appendicitis, cholecystitis, pancreatitis. These are considered less likely due to history of present illness and physical exam findings.   This is most consistent with an acute life/limb threatening illness complicated by underlying chronic conditions.   Initial Plan:  CBC/ Metabolic Panel  to evaluate for underlying infectious/metabolic etiology for patient's abdominal pain  Lipase to evaluate for pancreatitis  EKG to evaluate for cardiac source of pain  CT Ab/pelvis without contrast due to favored urologic over GI etiology for patient's abdominal pain  Urinalysis and repeat physical assessment to evaluate  for UTI/Pyelonpehritis  Empiric management of symptoms with escalating pain control and antiemetics as needed.   Initial Study Results:   Laboratory  All laboratory results reviewed without evidence of clinically relevant pathology.   Exceptions include: Creatinine rise from 1.1-2.0   EKG EKG was reviewed independently. Rate, rhythm, axis, intervals all examined and without medically relevant abnormality. ST segments without concerns for elevations.    Radiology All images reviewed  independently. Agree with radiology report at this time.   CT Renal Stone Study  Result Date: 03/23/2023 CLINICAL DATA:  Urolithiasis, symptomatic EXAM: CT ABDOMEN AND PELVIS WITHOUT CONTRAST TECHNIQUE: Multidetector CT imaging of the abdomen and pelvis was performed following the standard protocol without IV contrast. RADIATION DOSE REDUCTION: This exam was performed according to the departmental dose-optimization program which includes automated exposure control, adjustment of the mA and/or kV according to patient size and/or use of iterative reconstruction technique. COMPARISON:  PET CT 02/12/23 FINDINGS: Lower chest: Lung bases are clear. Note that the assessment of the abdominal and pelvic solid organs is limited in the absence of IV contrast Hepatobiliary: Liver has a normal contour. No perihepatic fluid. No evidence of cholelithiasis or cholecystitis. Pancreas: No evidence of peripancreatic fat stranding to suggest pancreatitis. Spleen: Normal in size without focal abnormality. Adrenals/Urinary Tract: Bilateral adrenal glands are in appearance. The right kidney without evidence of hydronephrosis or nephrolithiasis. There is moderate to severe left-sided hydronephrosis secondary to an obstructing 4 mm stone in the proximal left ureter. Additional small nonobstructing renal stone is also so seen in the upper pole of the left kidney redemonstrated is a hypodense posteriorly projecting renal cyst of the upper pole of the left kidney. Urinary bladder is fluid-filled without focal abnormality. No free fluid in the pelvis. Stomach/Bowel: No evidence of bowel obstruction. There is diverticulosis without evidence of diverticulitis. Appendix is normal in appearance. Vascular/Lymphatic: Aortic atherosclerosis. No enlarged abdominal or pelvic lymph nodes. Reproductive: Prostate is unremarkable. Other: No abdominal wall hernia or abnormality. No abdominopelvic ascites. Musculoskeletal: No fracture is seen. IMPRESSION:  Moderate to severe left-sided hydronephrosis secondary to an obstructing 4 mm stone in the proximal left ureter. Aortic Atherosclerosis (ICD10-I70.0). Electronically Signed   By: Lorenza Cambridge M.D.   On: 03/23/2023 10:42     Consults: Case discussed with Urology NP (working with Dr. Annabell Howells).  They recommended [facility] admission for definitive stone management.  Recommended hospitalist admission for AKI observation following procedure.  Disposition:   Based on the above findings, I believe this patient is stable for admission.    Patient/family educated about specific findings on our evaluation and explained exact reasons for admission.  Patient/family educated about clinical situation and time was allowed to answer questions.   Admission team communicated with and agreed with need for admission. Patient admitted. Patient  ready to move at this time.     Emergency Department Medication Summary:   Medications  HYDROmorphone (DILAUDID) injection 0.5 mg (0.5 mg Intravenous Given 03/23/23 1107)  lactated ringers bolus 1,000 mL (0 mLs Intravenous Stopped 03/23/23 1107)  morphine (PF) 4 MG/ML injection 6 mg (6 mg Intravenous Given 03/23/23 0927)  ondansetron (ZOFRAN) injection 4 mg (4 mg Intravenous Given 03/23/23 0927)             Clinical Impression: No diagnosis found.   Data Unavailable   Final Clinical Impression(s) / ED Diagnoses Final diagnoses:  None    Rx / DC Orders ED Discharge Orders     None  Glyn Ade, MD 03/23/23 1635

## 2023-03-23 NOTE — Anesthesia Postprocedure Evaluation (Signed)
Anesthesia Post Note  Patient: Richard Delacruz  Procedure(s) Performed: CYSTOSCOPY/URETEROSCOPY/HOLMIUM LASER/STENT PLACEMENT (Left)     Patient location during evaluation: PACU Anesthesia Type: General Level of consciousness: awake and alert Pain management: pain level controlled Vital Signs Assessment: post-procedure vital signs reviewed and stable Respiratory status: spontaneous breathing, nonlabored ventilation, respiratory function stable and patient connected to nasal cannula oxygen Cardiovascular status: blood pressure returned to baseline and stable Postop Assessment: no apparent nausea or vomiting Anesthetic complications: no  No notable events documented.  Last Vitals:  Vitals:   03/23/23 2015 03/23/23 2030  BP: 138/79 117/64  Pulse: 82 72  Resp: 13 14  Temp: 36.8 C   SpO2: 100% 97%    Last Pain:  Vitals:   03/23/23 2030  TempSrc:   PainSc: 0-No pain                 Albi Rappaport,W. EDMOND

## 2023-03-23 NOTE — ED Triage Notes (Signed)
Patient came in by POC today for kidney stones. Patient states he went to the urgent care on Wednesday and thursday last week. He states they told him it was noted in the left kidney. Patient states he is unable to pass the stone. He has an appointment tomorrow with Urology but he states the pain is too bad. Patient states he is unable to sleep due to the pain

## 2023-03-24 ENCOUNTER — Encounter (HOSPITAL_COMMUNITY): Payer: Self-pay | Admitting: Urology

## 2023-03-24 DIAGNOSIS — N200 Calculus of kidney: Secondary | ICD-10-CM | POA: Diagnosis not present

## 2023-03-24 DIAGNOSIS — N201 Calculus of ureter: Secondary | ICD-10-CM | POA: Diagnosis not present

## 2023-03-24 LAB — BASIC METABOLIC PANEL
Anion gap: 10 (ref 5–15)
BUN: 21 mg/dL (ref 8–23)
CO2: 22 mmol/L (ref 22–32)
Calcium: 8.6 mg/dL — ABNORMAL LOW (ref 8.9–10.3)
Chloride: 104 mmol/L (ref 98–111)
Creatinine, Ser: 1.48 mg/dL — ABNORMAL HIGH (ref 0.61–1.24)
GFR, Estimated: 51 mL/min — ABNORMAL LOW (ref >60)
Glucose, Bld: 140 mg/dL — ABNORMAL HIGH (ref 70–99)
Potassium: 4.8 mmol/L (ref 3.5–5.1)
Sodium: 136 mmol/L (ref 135–145)

## 2023-03-24 LAB — CBC
HCT: 41 % (ref 39.0–52.0)
Hemoglobin: 13.1 g/dL (ref 13.0–17.0)
MCH: 31.2 pg (ref 26.0–34.0)
MCHC: 32 g/dL (ref 30.0–36.0)
MCV: 97.6 fL (ref 80.0–100.0)
Platelets: 201 10*3/uL (ref 150–400)
RBC: 4.2 MIL/uL — ABNORMAL LOW (ref 4.22–5.81)
RDW: 12.9 % (ref 11.5–15.5)
WBC: 7.4 10*3/uL (ref 4.0–10.5)
nRBC: 0 % (ref 0.0–0.2)

## 2023-03-24 LAB — HIV ANTIBODY (ROUTINE TESTING W REFLEX): HIV Screen 4th Generation wRfx: NONREACTIVE

## 2023-03-24 LAB — CULTURE, BLOOD (ROUTINE X 2)

## 2023-03-24 MED ORDER — ATORVASTATIN CALCIUM 40 MG PO TABS: 40.0000 mg | ORAL_TABLET | ORAL | Status: DC

## 2023-03-24 MED ORDER — ENSURE ENLIVE PO LIQD
237.0000 mL | Freq: Two times a day (BID) | ORAL | Status: DC
  Administered 2023-03-24: 237 mL via ORAL

## 2023-03-24 NOTE — Progress Notes (Signed)
1 Day Post-Op Subjective: 6/18: NAEON.  Looks significantly better compared to when I saw him in the emergency department yesterday.  Patient states he feels well and is eager to discharge home.  Objective: Vital signs in last 24 hours: Temp:  [97.5 F (36.4 C)-99.3 F (37.4 C)] 97.5 F (36.4 C) (06/18 0835) Pulse Rate:  [62-82] 63 (06/18 0835) Resp:  [13-20] 17 (06/18 0835) BP: (112-144)/(64-85) 141/84 (06/18 0835) SpO2:  [96 %-100 %] 99 % (06/18 0835) Weight:  [97.5 kg] 97.5 kg (06/17 1559)  Intake/Output from previous day: 06/17 0701 - 06/18 0700 In: 2623.1 [I.V.:1623.1; IV Piggyback:1000] Out: 225 [Urine:225]  Intake/Output this shift: No intake/output data recorded.  Physical Exam:  General: Alert and oriented CV: No cyanosis Lungs: equal chest rise Abdomen: Soft, NTND, no rebound or guarding   Lab Results: Recent Labs    03/23/23 0856 03/24/23 0507  HGB 14.2 13.1  HCT 43.3 41.0   BMET Recent Labs    03/23/23 0856 03/24/23 0507  NA 134* 136  K 4.3 4.8  CL 101 104  CO2 21* 22  GLUCOSE 112* 140*  BUN 22 21  CREATININE 2.08* 1.48*  CALCIUM 8.8* 8.6*     Studies/Results: DG C-Arm 1-60 Min-No Report  Result Date: 03/23/2023 Fluoroscopy was utilized by the requesting physician.  No radiographic interpretation.   CT Renal Stone Study  Result Date: 03/23/2023 CLINICAL DATA:  Urolithiasis, symptomatic EXAM: CT ABDOMEN AND PELVIS WITHOUT CONTRAST TECHNIQUE: Multidetector CT imaging of the abdomen and pelvis was performed following the standard protocol without IV contrast. RADIATION DOSE REDUCTION: This exam was performed according to the departmental dose-optimization program which includes automated exposure control, adjustment of the mA and/or kV according to patient size and/or use of iterative reconstruction technique. COMPARISON:  PET CT 02/12/23 FINDINGS: Lower chest: Lung bases are clear. Note that the assessment of the abdominal and pelvic solid organs  is limited in the absence of IV contrast Hepatobiliary: Liver has a normal contour. No perihepatic fluid. No evidence of cholelithiasis or cholecystitis. Pancreas: No evidence of peripancreatic fat stranding to suggest pancreatitis. Spleen: Normal in size without focal abnormality. Adrenals/Urinary Tract: Bilateral adrenal glands are in appearance. The right kidney without evidence of hydronephrosis or nephrolithiasis. There is moderate to severe left-sided hydronephrosis secondary to an obstructing 4 mm stone in the proximal left ureter. Additional small nonobstructing renal stone is also so seen in the upper pole of the left kidney redemonstrated is a hypodense posteriorly projecting renal cyst of the upper pole of the left kidney. Urinary bladder is fluid-filled without focal abnormality. No free fluid in the pelvis. Stomach/Bowel: No evidence of bowel obstruction. There is diverticulosis without evidence of diverticulitis. Appendix is normal in appearance. Vascular/Lymphatic: Aortic atherosclerosis. No enlarged abdominal or pelvic lymph nodes. Reproductive: Prostate is unremarkable. Other: No abdominal wall hernia or abnormality. No abdominopelvic ascites. Musculoskeletal: No fracture is seen. IMPRESSION: Moderate to severe left-sided hydronephrosis secondary to an obstructing 4 mm stone in the proximal left ureter. Aortic Atherosclerosis (ICD10-I70.0). Electronically Signed   By: Lorenza Cambridge M.D.   On: 03/23/2023 10:42    Assessment/Plan: # Ureteral stone # AKI S/p laser lithotripsy with Dr. Annabell Howells on 6/17. Patient has stones in specimen cup and will bring these to his first office visit to send off for stone analysis Interval improvement in AKI 2.08-1.48 Patient will need to schedule office follow-up within a couple of weeks for reassessment and ureteral stent removal Patient is okay to discharge from urologic perspective.  LOS: 0 days   Elmon Kirschner, NP [facility] Pager: [phone]  03/24/2023, 9:30 AM

## 2023-03-24 NOTE — Plan of Care (Signed)
  Problem: Education: Goal: Knowledge of General Education information will improve Description: Including pain rating scale, medication(s)/side effects and non-pharmacologic comfort measures Outcome: Completed/Met   Problem: Health Behavior/Discharge Planning: Goal: Ability to manage health-related needs will improve Outcome: Progressing   Problem: Clinical Measurements: Goal: Ability to maintain clinical measurements within normal limits will improve Outcome: Progressing Goal: Will remain free from infection Outcome: Progressing Goal: Diagnostic test results will improve Outcome: Progressing Goal: Respiratory complications will improve Outcome: Adequate for Discharge Goal: Cardiovascular complication will be avoided Outcome: Adequate for Discharge   Problem: Activity: Goal: Risk for activity intolerance will decrease Outcome: Adequate for Discharge   Problem: Nutrition: Goal: Adequate nutrition will be maintained Outcome: Adequate for Discharge   Problem: Coping: Goal: Level of anxiety will decrease Outcome: Adequate for Discharge   Problem: Elimination: Goal: Will not experience complications related to bowel motility Outcome: Adequate for Discharge Goal: Will not experience complications related to urinary retention Outcome: Adequate for Discharge   Problem: Pain Managment: Goal: General experience of comfort will improve Outcome: Adequate for Discharge   Problem: Safety: Goal: Ability to remain free from injury will improve Outcome: Adequate for Discharge   Problem: Skin Integrity: Goal: Risk for impaired skin integrity will decrease Outcome: Adequate for Discharge

## 2023-03-24 NOTE — Discharge Summary (Signed)
Physician Discharge Summary  Richard Delacruz WGN:[id number] DOB: July 22, 1951 DOA: 03/23/2023  PCP: Sigmund Hazel, MD  Admit date: 03/23/2023 Discharge date: 03/24/2023  Admitted From: Home Disposition: Home  Recommendations for Outpatient Follow-up:  Follow up with PCP in 1-2 weeks Please obtain BMP/CBC in one week Please follow up with urology  Home Health: None Equipment/Devices: None  Discharge Condition: Stable CODE STATUS: Full Diet recommendation: Cardiac  Brief/Interim Summary:  Richard Delacruz is a 72 y.o. male with medical history significant of prostate cancer status post radiation and hormone therapy, DVT on Xarelto, HTN, right arm melanoma, anxiety/depression HLD, presented with persistent left flank pain and fever.   Symptoms started 1 week ago, with new onset of left flank pain, sharp, associated with nauseous vomiting went to see urgent care initially on 6/12 and study found deformity improved left obstructive ureteral stone and patient was reassured and discharged home with Flomax and pain medications.  Over the last 3 days, symptoms has become persistent associated with worsening of left flank pain and fever Tmax 101.0 for last 3 days for which patient has been taking Tylenol, and nauseous vomiting became worse and not able to eat or drink much since yesterday and became very dehydrated.  Denies any dysuria no chest pain or shortness of breath.   ED Course: Temperature 98.9, no tachycardia nonhypotensive.  Repeat CT renal stone study showed obstructive left ureteral stone with moderate left hydronephrosis.  Blood work showed AKI with creatinine 2.2 compared to baseline of 1.1, WBC 10.0, K4.3 bicarb 21  Discharge Diagnoses:  Principal Problem:   Ureter obstruction Active Problems:   Obstruction of left ureter   Fever    Acute left obstructive uropathy -Secondary to left ureter obstructive stone.  Status post laser lithotripsy Dr. Annabell Howells 03/23/2023.  Renal functions  improved.  He will follow-up with urology in couple of weeks for reassessment and stent removal.-Continue Flomax Cefdinir 300 mg twice a day [facility] [phone] for 1 week supply.  AKI -Prerenal secondary to left obstructive uropathy, resolved with IV fluids and lithotripsy.  Complicated UTI-UA essentially was negative.  He was febrile on admission.  Cefdinir 300 mg twice a day for 1 week.   HTN -Continue metoprolol   History of DVT continue Xarelto   Estimated body mass index is 31.75 kg/m as calculated from the following:   Height as of this encounter: 5\' 9"  (1.753 m).   Weight as of this encounter: 97.5 kg.  Discharge Instructions  Discharge Instructions     Diet - low sodium heart healthy   Complete by: As directed    Increase activity slowly   Complete by: As directed       Allergies as of 03/24/2023       Reactions   Atacand [candesartan] Anaphylaxis   Amlodipine    Swelling, joint and muscle pain    Monopril [fosinopril] Cough   Naproxen Other (See Comments)   Abdominal pain    Niaspan [niacin Er] Rash        Medication List     TAKE these medications    acetaminophen 500 MG tablet Commonly known as: TYLENOL Take 1,000 mg by mouth every 6 (six) hours as needed for moderate pain.   atorvastatin 40 MG tablet Commonly known as: LIPITOR Take 40 mg by mouth daily.   buPROPion 150 MG 12 hr tablet Commonly known as: WELLBUTRIN SR Take 150 mg by mouth 3 (three) times daily.   CALCIUM + D PO  Take 2 tablets by mouth daily.   clonazePAM 1 MG tablet Commonly known as: KLONOPIN Take 1 mg by mouth 2 (two) times daily.   diphenhydramine-acetaminophen 25-500 MG Tabs tablet Commonly known as: TYLENOL PM Take 2 tablets by mouth at bedtime.   lamoTRIgine 100 MG tablet Commonly known as: LAMICTAL Take 100 mg by mouth 2 (two) times daily.   metoprolol succinate 100 MG 24 hr tablet Commonly known as: TOPROL-XL Take 100 mg by mouth  daily. Take with or immediately following a meal.   multivitamin with minerals Tabs tablet Take 1 tablet by mouth daily.   ondansetron 4 MG disintegrating tablet Commonly known as: ZOFRAN-ODT Take 1 tablet (4 mg total) by mouth every 8 (eight) hours as needed for nausea or vomiting.   rivaroxaban 10 MG Tabs tablet Commonly known as: XARELTO Take 1 tablet (10 mg total) by mouth daily.   tamsulosin 0.4 MG Caps capsule Commonly known as: FLOMAX Take 1 capsule (0.4 mg total) by mouth daily.        Follow-up Information     [facility] Follow up.   Why: I have sent a message to the office to schedule follow up for you in a week.  The stent will need to be removed in the office at that time.  Please bring your stone fragments with you. Contact information: [address] [place] [address] [phone]        Sigmund Hazel, MD Follow up.   Specialty: Family Medicine Contact information: [address] [place] [address] [phone]                Allergies  Allergen Reactions   Atacand [Candesartan] Anaphylaxis   Amlodipine     Swelling, joint and muscle pain    Monopril [Fosinopril] Cough   Naproxen Other (See Comments)    Abdominal pain    Niaspan [Niacin Er] Rash    Consultations: urology   Procedures/Studies: DG C-Arm 1-60 Min-No Report  Result Date: 03/23/2023 Fluoroscopy was utilized by the requesting physician.  No radiographic interpretation.   CT Renal Stone Study  Result Date: 03/23/2023 CLINICAL DATA:  Urolithiasis, symptomatic EXAM: CT ABDOMEN AND PELVIS WITHOUT CONTRAST TECHNIQUE: Multidetector CT imaging of the abdomen and pelvis was performed following the standard protocol without IV contrast. RADIATION DOSE REDUCTION: This exam was performed according to the departmental dose-optimization program which includes automated exposure control, adjustment of the mA and/or kV according to patient size  and/or use of iterative reconstruction technique. COMPARISON:  PET CT 02/12/23 FINDINGS: Lower chest: Lung bases are clear. Note that the assessment of the abdominal and pelvic solid organs is limited in the absence of IV contrast Hepatobiliary: Liver has a normal contour. No perihepatic fluid. No evidence of cholelithiasis or cholecystitis. Pancreas: No evidence of peripancreatic fat stranding to suggest pancreatitis. Spleen: Normal in size without focal abnormality. Adrenals/Urinary Tract: Bilateral adrenal glands are in appearance. The right kidney without evidence of hydronephrosis or nephrolithiasis. There is moderate to severe left-sided hydronephrosis secondary to an obstructing 4 mm stone in the proximal left ureter. Additional small nonobstructing renal stone is also so seen in the upper pole of the left kidney redemonstrated is a hypodense posteriorly projecting renal cyst of the upper pole of the left kidney. Urinary bladder is fluid-filled without focal abnormality. No free fluid in the pelvis. Stomach/Bowel: No evidence of bowel obstruction. There is diverticulosis without evidence of diverticulitis. Appendix is normal in appearance. Vascular/Lymphatic: Aortic  atherosclerosis. No enlarged abdominal or pelvic lymph nodes. Reproductive: Prostate is unremarkable. Other: No abdominal wall hernia or abnormality. No abdominopelvic ascites. Musculoskeletal: No fracture is seen. IMPRESSION: Moderate to severe left-sided hydronephrosis secondary to an obstructing 4 mm stone in the proximal left ureter. Aortic Atherosclerosis (ICD10-I70.0). Electronically Signed   By: Lorenza Cambridge M.D.   On: 03/23/2023 10:42   (Echo, Carotid, EGD, Colonoscopy, ERCP)    Subjective: No c/o  Discharge Exam: Vitals:   03/24/23 0835 03/24/23 1156  BP: (!) 141/84 117/67  Pulse: 63 61  Resp: 17 16  Temp: (!) 97.5 F (36.4 C) 98 F (36.7 C)  SpO2: 99% 100%   Vitals:   03/24/23 0058 03/24/23 0426 03/24/23 0835 03/24/23  1156  BP: 112/66 (!) 144/78 (!) 141/84 117/67  Pulse: 64 62 63 61  Resp: 20 20 17 16   Temp: 98.1 F (36.7 C) 97.6 F (36.4 C) (!) 97.5 F (36.4 C) 98 F (36.7 C)  TempSrc: Oral Oral Oral Oral  SpO2: 96% 98% 99% 100%  Weight:      Height:        General: Pt is alert, awake, not in acute distress Cardiovascular: RRR, S1/S2 +, no rubs, no gallops Respiratory: CTA bilaterally, no wheezing, no rhonchi Abdominal: Soft, NT, ND, bowel sounds + Extremities: no edema, no cyanosis    The results of significant diagnostics from this hospitalization (including imaging, microbiology, ancillary and laboratory) are listed below for reference.     Microbiology: Recent Results (from the past 240 hour(s))  Urine Culture     Status: None   Collection Time: 03/18/23 12:38 PM   Specimen: Urine, Clean Catch  Result Value Ref Range Status   Specimen Description URINE, CLEAN CATCH  Final   Special Requests NONE  Final   Culture   Final    NO GROWTH Performed at [facility], [address], [place], [place] [address]    Report Status 03/19/2023 FINAL  Final  Culture, blood (Routine X 2) w Reflex to ID Panel     Status: None (Preliminary result)   Collection Time: 03/23/23  3:14 PM   Specimen: BLOOD RIGHT HAND  Result Value Ref Range Status   Specimen Description   Final    BLOOD RIGHT HAND Performed at [facility], [address] [place], [place] [address]    Special Requests   Final    AEROBIC BOTTLE ONLY Blood Culture results may not be optimal due to an inadequate volume of blood received in culture bottles Performed at [facility], [address] [place], [place] [address]    Culture   Final    NO GROWTH < 24 HOURS Performed at [facility], [address], [place], [place] [address]    Report Status PENDING  Incomplete  Culture, blood (Routine X 2) w Reflex to ID Panel     Status: None (Preliminary result)   Collection Time:  03/23/23  3:17 PM   Specimen: BLOOD RIGHT ARM  Result Value Ref Range Status   Specimen Description   Final    BLOOD RIGHT ARM Performed at [facility], [address] [place], [place] [address]    Special Requests   Final    AEROBIC BOTTLE ONLY Blood Culture results may not be optimal due to an inadequate volume of blood received in culture bottles Performed at [facility], [address], [place], [place] [address]  Culture   Final    NO GROWTH < 24 HOURS Performed at [facility], [address] [place], [place] [address]    Report Status PENDING  Incomplete  Surgical pcr screen     Status: None   Collection Time: 03/23/23  3:41 PM   Specimen: Nasal Mucosa; Nasal Swab  Result Value Ref Range Status   MRSA, PCR NEGATIVE NEGATIVE Final   Staphylococcus aureus NEGATIVE NEGATIVE Final    Comment: (NOTE) The Xpert SA Assay (FDA approved for NASAL specimens in patients 67 years of age and older), is one component of a comprehensive surveillance program. It is not intended to diagnose infection nor to guide or monitor treatment. Performed at [facility], [address], [place], [place] [address]      Labs: BNP (last 3 results) No results for input(s): "BNP" in the last 8760 hours. Basic Metabolic Panel: Recent Labs  Lab 03/23/23 0856 03/24/23 0507  NA 134* 136  K 4.3 4.8  CL 101 104  CO2 21* 22  GLUCOSE 112* 140*  BUN 22 21  CREATININE 2.08* 1.48*  CALCIUM 8.8* 8.6*   Liver Function Tests: Recent Labs  Lab 03/23/23 0856  AST 20  ALT 17  ALKPHOS 63  BILITOT 0.7  PROT 6.6  ALBUMIN 3.0*   No results for input(s): "LIPASE", "AMYLASE" in the last 168 hours. No results for input(s): "AMMONIA" in the last 168 hours. CBC: Recent Labs  Lab 03/23/23 0856 03/24/23 0507  WBC 10.0 7.4  NEUTROABS 8.4*  --   HGB 14.2 13.1  HCT 43.3 41.0  MCV 96.7 97.6  PLT 187 201   Cardiac Enzymes: No  results for input(s): "CKTOTAL", "CKMB", "CKMBINDEX", "TROPONINI" in the last 168 hours. BNP: Invalid input(s): "POCBNP" CBG: No results for input(s): "GLUCAP" in the last 168 hours. D-Dimer No results for input(s): "DDIMER" in the last 72 hours. Hgb A1c No results for input(s): "HGBA1C" in the last 72 hours. Lipid Profile No results for input(s): "CHOL", "HDL", "LDLCALC", "TRIG", "CHOLHDL", "LDLDIRECT" in the last 72 hours. Thyroid function studies No results for input(s): "TSH", "T4TOTAL", "T3FREE", "THYROIDAB" in the last 72 hours.  Invalid input(s): "FREET3" Anemia work up No results for input(s): "VITAMINB12", "FOLATE", "FERRITIN", "TIBC", "IRON", "RETICCTPCT" in the last 72 hours. Urinalysis    Component Value Date/Time   COLORURINE YELLOW 03/23/2023 1136   APPEARANCEUR CLEAR 03/23/2023 1136   LABSPEC 1.012 03/23/2023 1136   PHURINE 5.0 03/23/2023 1136   GLUCOSEU NEGATIVE 03/23/2023 1136   HGBUR NEGATIVE 03/23/2023 1136   BILIRUBINUR NEGATIVE 03/23/2023 1136   BILIRUBINUR negative 03/20/2023 0941   KETONESUR 5 (A) 03/23/2023 1136   PROTEINUR NEGATIVE 03/23/2023 1136   UROBILINOGEN 0.2 03/20/2023 0941   NITRITE NEGATIVE 03/23/2023 1136   LEUKOCYTESUR NEGATIVE 03/23/2023 1136   Sepsis Labs Recent Labs  Lab 03/23/23 0856 03/24/23 0507  WBC 10.0 7.4   Microbiology Recent Results (from the past 240 hour(s))  Urine Culture     Status: None   Collection Time: 03/18/23 12:38 PM   Specimen: Urine, Clean Catch  Result Value Ref Range Status   Specimen Description URINE, CLEAN CATCH  Final   Special Requests NONE  Final   Culture   Final    NO GROWTH Performed at [facility], [address], [place], [place] [address]    Report Status 03/19/2023 FINAL  Final  Culture, blood (Routine X 2) w Reflex to ID Panel     Status:  None (Preliminary result)   Collection Time: 03/23/23  3:14 PM   Specimen: BLOOD RIGHT HAND  Result Value Ref Range Status   Specimen  Description   Final    BLOOD RIGHT HAND Performed at [facility], [address] [place], [place] [address]    Special Requests   Final    AEROBIC BOTTLE ONLY Blood Culture results may not be optimal due to an inadequate volume of blood received in culture bottles Performed at [facility], [address], [place], [place] [address]    Culture   Final    NO GROWTH < 24 HOURS Performed at [facility], [address] [place], [place] [address]    Report Status PENDING  Incomplete  Culture, blood (Routine X 2) w Reflex to ID Panel     Status: None (Preliminary result)   Collection Time: 03/23/23  3:17 PM   Specimen: BLOOD RIGHT ARM  Result Value Ref Range Status   Specimen Description   Final    BLOOD RIGHT ARM Performed at [facility], [address], [place], [place] [address]    Special Requests   Final    AEROBIC BOTTLE ONLY Blood Culture results may not be optimal due to an inadequate volume of blood received in culture bottles Performed at [facility], [address], [place], [place] [address]    Culture   Final    NO GROWTH < 24 HOURS Performed at [facility], [address] [place], [place] [address]    Report Status PENDING  Incomplete  Surgical pcr screen     Status: None   Collection Time: 03/23/23  3:41 PM   Specimen: Nasal Mucosa; Nasal Swab  Result Value Ref Range Status   MRSA, PCR NEGATIVE NEGATIVE Final   Staphylococcus aureus NEGATIVE NEGATIVE Final    Comment: (NOTE) The Xpert SA Assay (FDA approved for NASAL specimens in patients 51 years of age and older), is one component of a comprehensive surveillance program. It is not intended to diagnose infection nor to guide or monitor treatment. Performed at [facility], [address], [place], [place] [address]      Time coordinating discharge: 37 minutes  SIGNED:  Alwyn Ren,  MD  [facility] 03/24/2023, 5:06 PM

## 2023-03-24 NOTE — Progress Notes (Signed)
Mobility Specialist - Progress Note   03/24/23 0931  Mobility  Activity Ambulated with assistance in hallway  Level of Assistance Modified independent, requires aide device or extra time  Assistive Device Front wheel walker  Distance Ambulated (ft) 265 ft  Activity Response Tolerated well  Mobility Referral Yes  $Mobility charge 1 Mobility  Mobility Specialist Start Time (ACUTE ONLY) [id number]  Mobility Specialist Stop Time (ACUTE ONLY) 0930  Mobility Specialist Time Calculation (min) (ACUTE ONLY) 6 min   Pt received in bed and agreeable to mobility. No complaints during session. Pt to bathroom after session with all needs met.    [facility]

## 2023-03-27 LAB — CULTURE, BLOOD (ROUTINE X 2)

## 2023-03-28 LAB — CULTURE, BLOOD (ROUTINE X 2)
Culture: NO GROWTH
Culture: NO GROWTH

## 2023-03-30 DIAGNOSIS — Z510 Encounter for antineoplastic radiation therapy: Secondary | ICD-10-CM | POA: Diagnosis not present

## 2023-03-30 DIAGNOSIS — C3432 Malignant neoplasm of lower lobe, left bronchus or lung: Secondary | ICD-10-CM | POA: Diagnosis not present

## 2023-03-30 DIAGNOSIS — C3412 Malignant neoplasm of upper lobe, left bronchus or lung: Secondary | ICD-10-CM | POA: Diagnosis not present

## 2023-03-30 DIAGNOSIS — C3411 Malignant neoplasm of upper lobe, right bronchus or lung: Secondary | ICD-10-CM | POA: Diagnosis not present

## 2023-03-31 ENCOUNTER — Other Ambulatory Visit: Payer: Self-pay

## 2023-03-31 ENCOUNTER — Ambulatory Visit
Admission: RE | Admit: 2023-03-31 | Discharge: 2023-03-31 | Disposition: A | Discharge status: Home / Self Care | Payer: PPO | Source: Ambulatory Visit | Attending: Radiation Oncology | Admitting: Radiation Oncology

## 2023-03-31 DIAGNOSIS — Z510 Encounter for antineoplastic radiation therapy: Secondary | ICD-10-CM | POA: Diagnosis not present

## 2023-03-31 DIAGNOSIS — N201 Calculus of ureter: Secondary | ICD-10-CM | POA: Diagnosis not present

## 2023-03-31 DIAGNOSIS — C3491 Malignant neoplasm of unspecified part of right bronchus or lung: Secondary | ICD-10-CM

## 2023-03-31 LAB — RAD ONC ARIA SESSION SUMMARY
Course Elapsed Days: 0
Plan Fractions Treated to Date: 1
Plan Fractions Treated to Date: 1
Plan Prescribed Dose Per Fraction: 10 Gy
Plan Prescribed Dose Per Fraction: 12 Gy
Plan Total Fractions Prescribed: 5
Plan Total Fractions Prescribed: 5
Plan Total Prescribed Dose: 50 Gy
Plan Total Prescribed Dose: 60 Gy
Reference Point Dosage Given to Date: 10 Gy
Reference Point Dosage Given to Date: 12 Gy
Reference Point Session Dosage Given: 10 Gy
Reference Point Session Dosage Given: 12 Gy
Session Number: 1

## 2023-04-01 ENCOUNTER — Ambulatory Visit: Payer: PPO | Admitting: Radiation Oncology

## 2023-04-02 ENCOUNTER — Other Ambulatory Visit: Payer: Self-pay

## 2023-04-02 ENCOUNTER — Ambulatory Visit
Admission: RE | Admit: 2023-04-02 | Discharge: 2023-04-02 | Disposition: A | Discharge status: Home / Self Care | Payer: PPO | Source: Ambulatory Visit | Attending: Radiation Oncology | Admitting: Radiation Oncology

## 2023-04-02 DIAGNOSIS — Z510 Encounter for antineoplastic radiation therapy: Secondary | ICD-10-CM | POA: Diagnosis not present

## 2023-04-02 DIAGNOSIS — C3491 Malignant neoplasm of unspecified part of right bronchus or lung: Secondary | ICD-10-CM

## 2023-04-02 LAB — RAD ONC ARIA SESSION SUMMARY
Course Elapsed Days: 2
Plan Fractions Treated to Date: 2
Plan Fractions Treated to Date: 2
Plan Fractions Treated to Date: 2
Plan Prescribed Dose Per Fraction: 10 Gy
Plan Prescribed Dose Per Fraction: 10 Gy
Plan Prescribed Dose Per Fraction: 12 Gy
Plan Total Fractions Prescribed: 5
Plan Total Fractions Prescribed: 5
Plan Total Fractions Prescribed: 5
Plan Total Prescribed Dose: 50 Gy
Plan Total Prescribed Dose: 50 Gy
Plan Total Prescribed Dose: 60 Gy
Reference Point Dosage Given to Date: 20 Gy
Reference Point Dosage Given to Date: 20 Gy
Reference Point Dosage Given to Date: 24 Gy
Reference Point Session Dosage Given: 10 Gy
Reference Point Session Dosage Given: 10 Gy
Reference Point Session Dosage Given: 12 Gy
Session Number: 2

## 2023-04-03 ENCOUNTER — Ambulatory Visit: Payer: PPO | Admitting: Radiation Oncology

## 2023-04-06 ENCOUNTER — Other Ambulatory Visit: Payer: Self-pay

## 2023-04-06 ENCOUNTER — Ambulatory Visit
Admission: RE | Admit: 2023-04-06 | Discharge: 2023-04-06 | Disposition: A | Discharge status: Home / Self Care | Payer: PPO | Source: Ambulatory Visit | Attending: Radiation Oncology | Admitting: Radiation Oncology

## 2023-04-06 DIAGNOSIS — R918 Other nonspecific abnormal finding of lung field: Secondary | ICD-10-CM | POA: Insufficient documentation

## 2023-04-06 DIAGNOSIS — C3411 Malignant neoplasm of upper lobe, right bronchus or lung: Secondary | ICD-10-CM | POA: Insufficient documentation

## 2023-04-06 DIAGNOSIS — Z510 Encounter for antineoplastic radiation therapy: Secondary | ICD-10-CM | POA: Insufficient documentation

## 2023-04-06 LAB — RAD ONC ARIA SESSION SUMMARY
Course Elapsed Days: 6
Plan Fractions Treated to Date: 3
Plan Fractions Treated to Date: 3
Plan Fractions Treated to Date: 3
Plan Prescribed Dose Per Fraction: 10 Gy
Plan Prescribed Dose Per Fraction: 10 Gy
Plan Prescribed Dose Per Fraction: 12 Gy
Plan Total Fractions Prescribed: 5
Plan Total Fractions Prescribed: 5
Plan Total Fractions Prescribed: 5
Plan Total Prescribed Dose: 50 Gy
Plan Total Prescribed Dose: 50 Gy
Plan Total Prescribed Dose: 60 Gy
Reference Point Dosage Given to Date: 30 Gy
Reference Point Dosage Given to Date: 30 Gy
Reference Point Dosage Given to Date: 36 Gy
Reference Point Session Dosage Given: 10 Gy
Reference Point Session Dosage Given: 10 Gy
Reference Point Session Dosage Given: 12 Gy
Session Number: 3

## 2023-04-08 ENCOUNTER — Other Ambulatory Visit: Payer: Self-pay

## 2023-04-08 ENCOUNTER — Ambulatory Visit
Admission: RE | Admit: 2023-04-08 | Discharge: 2023-04-08 | Disposition: A | Discharge status: Home / Self Care | Payer: PPO | Source: Ambulatory Visit | Attending: Radiation Oncology | Admitting: Radiation Oncology

## 2023-04-08 DIAGNOSIS — Z510 Encounter for antineoplastic radiation therapy: Secondary | ICD-10-CM | POA: Diagnosis not present

## 2023-04-08 DIAGNOSIS — R918 Other nonspecific abnormal finding of lung field: Secondary | ICD-10-CM

## 2023-04-08 LAB — RAD ONC ARIA SESSION SUMMARY
Course Elapsed Days: 8
Plan Fractions Treated to Date: 4
Plan Fractions Treated to Date: 4
Plan Fractions Treated to Date: 4
Plan Prescribed Dose Per Fraction: 10 Gy
Plan Prescribed Dose Per Fraction: 10 Gy
Plan Prescribed Dose Per Fraction: 12 Gy
Plan Total Fractions Prescribed: 5
Plan Total Fractions Prescribed: 5
Plan Total Fractions Prescribed: 5
Plan Total Prescribed Dose: 50 Gy
Plan Total Prescribed Dose: 50 Gy
Plan Total Prescribed Dose: 60 Gy
Reference Point Dosage Given to Date: 40 Gy
Reference Point Dosage Given to Date: 40 Gy
Reference Point Dosage Given to Date: 48 Gy
Reference Point Session Dosage Given: 10 Gy
Reference Point Session Dosage Given: 10 Gy
Reference Point Session Dosage Given: 12 Gy
Session Number: 4

## 2023-04-10 ENCOUNTER — Other Ambulatory Visit: Payer: Self-pay

## 2023-04-10 ENCOUNTER — Ambulatory Visit
Admission: RE | Admit: 2023-04-10 | Discharge: 2023-04-10 | Disposition: A | Discharge status: Home / Self Care | Payer: PPO | Source: Ambulatory Visit | Attending: Radiation Oncology | Admitting: Radiation Oncology

## 2023-04-10 DIAGNOSIS — C3411 Malignant neoplasm of upper lobe, right bronchus or lung: Secondary | ICD-10-CM | POA: Diagnosis not present

## 2023-04-10 DIAGNOSIS — C3432 Malignant neoplasm of lower lobe, left bronchus or lung: Secondary | ICD-10-CM | POA: Diagnosis not present

## 2023-04-10 DIAGNOSIS — C3412 Malignant neoplasm of upper lobe, left bronchus or lung: Secondary | ICD-10-CM | POA: Diagnosis not present

## 2023-04-10 DIAGNOSIS — Z510 Encounter for antineoplastic radiation therapy: Secondary | ICD-10-CM | POA: Diagnosis not present

## 2023-04-10 LAB — RAD ONC ARIA SESSION SUMMARY
Course Elapsed Days: 10
Plan Fractions Treated to Date: 5
Plan Fractions Treated to Date: 5
Plan Fractions Treated to Date: 5
Plan Prescribed Dose Per Fraction: 10 Gy
Plan Prescribed Dose Per Fraction: 10 Gy
Plan Prescribed Dose Per Fraction: 12 Gy
Plan Total Fractions Prescribed: 5
Plan Total Fractions Prescribed: 5
Plan Total Fractions Prescribed: 5
Plan Total Prescribed Dose: 50 Gy
Plan Total Prescribed Dose: 50 Gy
Plan Total Prescribed Dose: 60 Gy
Reference Point Dosage Given to Date: 50 Gy
Reference Point Dosage Given to Date: 50 Gy
Reference Point Dosage Given to Date: 60 Gy
Reference Point Session Dosage Given: 10 Gy
Reference Point Session Dosage Given: 10 Gy
Reference Point Session Dosage Given: 12 Gy
Session Number: 5

## 2023-04-13 NOTE — Radiation Completion Notes (Signed)
Patient Name: Richard Delacruz, Richard Delacruz MRN: [id number] Date of Birth: [birth year] Referring Physician: Si Gaul, M.D. Date of Service: 2023-04-13 Radiation Oncologist: Arnette Schaumann, M.D. [facility]                             RADIATION ONCOLOGY END OF TREATMENT NOTE     Diagnosis: C34.11 Malignant neoplasm of upper lobe, right bronchus or lung Intent: Curative     ==========DELIVERED PLANS==========  First Treatment Date: 2023-03-31 - Last Treatment Date: 2023-04-10   Plan Name: Lung_RUL_SBRT Site: Lung, Right Technique: SBRT/SRT-IMRT Mode: Photon Dose Per Fraction: 10 Gy Prescribed Dose (Delivered / Prescribed): 50 Gy / 50 Gy Prescribed Fxs (Delivered / Prescribed): 5 / 5   Plan Name: Lung_L_UL_SBR Site: Lung, Left Technique: SBRT/SRT-IMRT Mode: Photon Dose Per Fraction: 10 Gy Prescribed Dose (Delivered / Prescribed): 50 Gy / 50 Gy Prescribed Fxs (Delivered / Prescribed): 5 / 5   Plan Name: Lung_L_LL_SBR Site: Lung, Left Technique: SBRT/SRT-IMRT Mode: Photon Dose Per Fraction: 12 Gy Prescribed Dose (Delivered / Prescribed): 60 Gy / 60 Gy Prescribed Fxs (Delivered / Prescribed): 5 / 5     ==========ON TREATMENT VISIT DATES========== 2023-03-31, 2023-03-31, 2023-03-31, 2023-04-02, 2023-04-02, 2023-04-02, 2023-04-06, 2023-04-06, 2023-04-06, 2023-04-08, 2023-04-08, 2023-04-08, 2023-04-08, 2023-04-10, 2023-04-10, 2023-04-10     ==========UPCOMING VISITS==========       ==========APPENDIX - ON TREATMENT VISIT NOTES==========   See weekly On Treatment Notes in Epic for details.

## 2023-04-29 DIAGNOSIS — Z1389 Encounter for screening for other disorder: Secondary | ICD-10-CM | POA: Diagnosis not present

## 2023-04-29 DIAGNOSIS — E663 Overweight: Secondary | ICD-10-CM | POA: Diagnosis not present

## 2023-04-29 DIAGNOSIS — Z9181 History of falling: Secondary | ICD-10-CM | POA: Diagnosis not present

## 2023-04-29 DIAGNOSIS — F5101 Primary insomnia: Secondary | ICD-10-CM | POA: Diagnosis not present

## 2023-04-29 DIAGNOSIS — Z0000 Encounter for general adult medical examination without abnormal findings: Secondary | ICD-10-CM | POA: Diagnosis not present

## 2023-04-29 DIAGNOSIS — F4321 Adjustment disorder with depressed mood: Secondary | ICD-10-CM | POA: Diagnosis not present

## 2023-04-29 DIAGNOSIS — F339 Major depressive disorder, recurrent, unspecified: Secondary | ICD-10-CM | POA: Diagnosis not present

## 2023-04-29 DIAGNOSIS — Z1211 Encounter for screening for malignant neoplasm of colon: Secondary | ICD-10-CM | POA: Diagnosis not present

## 2023-05-06 ENCOUNTER — Encounter: Payer: Self-pay | Admitting: Radiation Oncology

## 2023-05-08 DIAGNOSIS — Z6829 Body mass index (BMI) 29.0-29.9, adult: Secondary | ICD-10-CM | POA: Diagnosis not present

## 2023-05-08 DIAGNOSIS — F5101 Primary insomnia: Secondary | ICD-10-CM | POA: Diagnosis not present

## 2023-05-08 DIAGNOSIS — F4321 Adjustment disorder with depressed mood: Secondary | ICD-10-CM | POA: Diagnosis not present

## 2023-05-08 DIAGNOSIS — F339 Major depressive disorder, recurrent, unspecified: Secondary | ICD-10-CM | POA: Diagnosis not present

## 2023-05-10 NOTE — Progress Notes (Signed)
  Radiation Oncology         [phone] ________________________________  Name: Richard Delacruz MRN: [id number]  Date: 05/11/2023  DOB: 09/23/1951  End of Treatment Note  Diagnosis: The primary encounter diagnosis was Malignant neoplasm of right upper lobe of lung (HCC). Diagnoses of Malignant neoplasm of upper lobe bronchus, left (HCC) and Adenocarcinoma of right lung [facility]) were also pertinent to this visit.   Synchronous stage Ia (T1b, N0, M0) adenocarcinoma of the right upper lobe as well as suspicious stage IIb (T3, N0, M0) non-small cell lung cancer involving the lingula and left upper lobe diagnosed in April 2024.       Indication for treatment: Curative        Radiation treatment dates: 03/31/23 through 04/10/23   Site/dose:   1) Right upper lung - 50 Gy delivered in 5 Fx at 10 Gy/Fx 2) Left upper lung - 50 Gy delivered in 5 Fx at 10 Gy/Fx 3) Left lower lung - 60 Gy delivered in 5 Fx at 12 Gy/Fx  Technique/Mode: SBRT/SRT-IMRT / Photon   Beams/energy: 6X-FFF  Narrative: The patient tolerated radiation treatment relatively well. During his final weekly treatment check on 04/08/23, the patient denied any side effects other than fatigue. He also denied any respiratory issues or concerns.    Plan: The patient has completed radiation treatment. The patient will return to radiation oncology clinic for routine followup in one month. I advised them to call or return sooner if they have any questions or concerns related to their recovery or treatment.  -----------------------------------  Billie Lade, PhD, MD  This document serves as a record of services personally performed by Antony Blackbird, MD. It was created on his behalf by Neena Rhymes, a trained medical scribe. The creation of this record is based on the scribe's personal observations and the provider's statements to them. This document has been checked and approved by the attending provider.

## 2023-05-10 NOTE — Progress Notes (Signed)
Radiation Oncology         [phone] ________________________________  Name: Richard Delacruz MRN: [id number]  Date: 05/11/2023  DOB: August 12, 1951  Follow-Up Visit Note  CC: Sigmund Hazel, MD  Si Gaul, MD  No diagnosis found.  Diagnosis: The primary encounter diagnosis was Malignant neoplasm of right upper lobe of lung (HCC). Diagnoses of Malignant neoplasm of upper lobe bronchus, left (HCC) and Adenocarcinoma of right lung [facility]) were also pertinent to this visit.   Synchronous stage Ia (T1b, N0, M0) adenocarcinoma of the right upper lobe as well as suspicious stage IIb (T3, N0, M0) non-small cell lung cancer involving the lingula and left upper lobe diagnosed in April 2024.   Interval Since Last Radiation: 1 month   Indication for treatment: Curative       Radiation treatment dates: 03/31/23 through 04/10/23  Site/dose:   1) Right upper lung - 50 Gy delivered in 5 Fx at 10 Gy/Fx 2) Left upper lung - 50 Gy delivered in 5 Fx at 10 Gy/Fx 3) Left lower lung - 60 Gy delivered in 5 Fx at 12 Gy/Fx Technique/Mode: SBRT/SRT-IMRT / Photon  Beams/energy: 6X-FFF  Narrative:  The patient returns today for routine follow-up. He tolerated radiation treatment relatively well. During his final weekly treatment check on 04/08/23, the patient denied any side effects other than fatigue. He also denied any respiratory issues or concerns.    Prior to starting radiation therapy, the patient was hospitalized from 03/23/23 through 03/24/23 with AKI and acute left obstructive uropathy secondary to a left ureter stone. Hospital course included cefdinir, flomax, IV fluids, and laser lithotripsy under the care of Dr. Annabell Howells 03/23/2023. (Imaging performed while inpatient included a CT renal stone study which showed moderate to severe left-sided hydronephrosis secondary to an obstructing 4 mm stone in the proximal left ureter).    ***                           Allergies:  is allergic to atacand  [candesartan], amlodipine, monopril [fosinopril], naproxen, and niaspan [niacin er].  Meds: Current Outpatient Medications  Medication Sig Dispense Refill   acetaminophen (TYLENOL) 500 MG tablet Take 1,000 mg by mouth every 6 (six) hours as needed for moderate pain.     atorvastatin (LIPITOR) 40 MG tablet Take 40 mg by mouth daily.     buPROPion (WELLBUTRIN SR) 150 MG 12 hr tablet Take 150 mg by mouth 3 (three) times daily.     Calcium Citrate-Vitamin D (CALCIUM + D PO) Take 2 tablets by mouth daily.     clonazePAM (KLONOPIN) 1 MG tablet Take 1 mg by mouth 2 (two) times daily.      diphenhydramine-acetaminophen (TYLENOL PM) 25-500 MG TABS tablet Take 2 tablets by mouth at bedtime.     lamoTRIgine (LAMICTAL) 100 MG tablet Take 100 mg by mouth 2 (two) times daily.     metoprolol succinate (TOPROL-XL) 100 MG 24 hr tablet Take 100 mg by mouth daily. Take with or immediately following a meal.     Multiple Vitamin (MULTIVITAMIN WITH MINERALS) TABS tablet Take 1 tablet by mouth daily.     ondansetron (ZOFRAN-ODT) 4 MG disintegrating tablet Take 1 tablet (4 mg total) by mouth every 8 (eight) hours as needed for nausea or vomiting. 20 tablet 0   rivaroxaban (XARELTO) 10 MG TABS tablet Take 1 tablet (10 mg total) by mouth daily. 30 tablet 0   tamsulosin (FLOMAX) 0.4 MG CAPS capsule  Take 1 capsule (0.4 mg total) by mouth daily. 14 capsule 0   No current facility-administered medications for this encounter.    Physical Findings: The patient is in no acute distress. Patient is alert and oriented.  vitals were not taken for this visit. .  No significant changes. Lungs are clear to auscultation bilaterally. Heart has regular rate and rhythm. No palpable cervical, supraclavicular, or axillary adenopathy. Abdomen soft, non-tender, normal bowel sounds.   Lab Findings: Lab Results  Component Value Date   WBC 7.4 03/24/2023   HGB 13.1 03/24/2023   HCT 41.0 03/24/2023   MCV 97.6 03/24/2023   PLT 201  03/24/2023    Radiographic Findings: No results found.  Impression: The primary encounter diagnosis was Malignant neoplasm of right upper lobe of lung (HCC). Diagnoses of Malignant neoplasm of upper lobe bronchus, left (HCC) and Adenocarcinoma of right lung [facility]) were also pertinent to this visit.   Synchronous stage Ia (T1b, N0, M0) adenocarcinoma of the right upper lobe as well as suspicious stage IIb (T3, N0, M0) non-small cell lung cancer involving the lingula and left upper lobe diagnosed in April 2024.   The patient is recovering from the effects of radiation.  ***  Plan:  ***   *** minutes of total time was spent for this patient encounter, including preparation, face-to-face counseling with the patient and coordination of care, physical exam, and documentation of the encounter. ____________________________________  Billie Lade, PhD, MD  This document serves as a record of services personally performed by Antony Blackbird, MD. It was created on his behalf by Neena Rhymes, a trained medical scribe. The creation of this record is based on the scribe's personal observations and the provider's statements to them. This document has been checked and approved by the attending provider.

## 2023-05-11 ENCOUNTER — Other Ambulatory Visit: Payer: Self-pay

## 2023-05-11 ENCOUNTER — Encounter: Payer: Self-pay | Admitting: Radiation Oncology

## 2023-05-11 ENCOUNTER — Ambulatory Visit
Admission: RE | Admit: 2023-05-11 | Discharge: 2023-05-11 | Disposition: A | Discharge status: Home / Self Care | Payer: PPO | Source: Ambulatory Visit | Attending: Radiation Oncology | Admitting: Radiation Oncology

## 2023-05-11 VITALS — BP 124/73 | HR 55 | Temp 97.1°F | Resp 18 | Ht 69.0 in | Wt 202.0 lb

## 2023-05-11 DIAGNOSIS — Z1211 Encounter for screening for malignant neoplasm of colon: Secondary | ICD-10-CM | POA: Diagnosis not present

## 2023-05-11 DIAGNOSIS — C3411 Malignant neoplasm of upper lobe, right bronchus or lung: Secondary | ICD-10-CM | POA: Diagnosis not present

## 2023-05-11 DIAGNOSIS — C3491 Malignant neoplasm of unspecified part of right bronchus or lung: Secondary | ICD-10-CM

## 2023-05-11 DIAGNOSIS — Z923 Personal history of irradiation: Secondary | ICD-10-CM | POA: Diagnosis not present

## 2023-05-11 HISTORY — DX: Personal history of irradiation: Z92.3

## 2023-05-11 NOTE — Progress Notes (Signed)
Richard Delacruz is here today for follow up post radiation to the lung.  Lung Side: Right, patient completed treatment on 04/10/23.  Does the patient complain of any of the following: Pain:NO Shortness of breath w/wo exertion: No Cough: No Hemoptysis: No Pain with swallowing: No Swallowing/choking concerns: No Appetite: He states between poor and poor Energy Level: More fatigue Post radiation skin Changes: No  BP 124/73 (BP Location: Left Arm, Patient Position: Sitting)   Pulse (!) 55   Temp (!) 97.1 F (36.2 C) (Temporal)   Resp 18   Ht 5\' 9"  (1.753 m)   Wt 202 lb (91.6 kg)   SpO2 100%   BMI 29.83 kg/m      Additional comments if applicable:;

## 2023-05-19 ENCOUNTER — Ambulatory Visit: Payer: PPO | Admitting: Nurse Practitioner

## 2023-05-19 ENCOUNTER — Encounter: Payer: Self-pay | Admitting: Nurse Practitioner

## 2023-05-19 VITALS — BP 112/62 | HR 55 | Ht 69.5 in | Wt 198.8 lb

## 2023-05-19 DIAGNOSIS — C3491 Malignant neoplasm of unspecified part of right bronchus or lung: Secondary | ICD-10-CM | POA: Diagnosis not present

## 2023-05-19 NOTE — Progress Notes (Signed)
@Patient  ID: Richard Delacruz, male    DOB: [birth year], 72 y.o.   MRN: [id number]  Chief Complaint  Patient presents with   Follow-up    Pt has no concerns. Pt has been going to his oncology appointments     Referring provider: Sigmund Hazel, MD  HPI: 72 year old male, never smoker followed for adenocarcinoma of the right lung. He is a patient of Dr. Kavin Leech and last seen in office 02/06/2023 by [facility] NP. Past medical history significant for prostate cancer, malignant melanoma, HLD, bipolar, migraines, hypertension, hx of DVT on Xarelto.   TEST/EVENTS:  12/18/2022 PET PSMA: No signs of PSMA avid disease in the neck, chest, abdomen or pelvis.  Pulmonary nodules, new and enlarging but not PSMA avid. 01/23/2023 super D CT chest: Central airways are patent.  Bilateral solid pulmonary nodules, increased in size.  Lingula measuring 14 x 9 mm, previously 8 mm.  Solid nodule of the right apex measuring 9 x 7 mm, previously 3 mm.  01/20/2023: OV with Dr. Delton Coombes for initial pulmonary consult.  History of pulmonary nodular disease going back to imaging from February 2019.  Deemed stable in size and appearance.  He had a rising PSA which prompted a repeat PET scan done on 12/18/2022 with enlargement in pulmonary nodules noted that were not PSMA hypermetabolic, and consistent with metastatic prostate cancer.  Recommendation was to move forward with tissue diagnosis.  Robotic assisted navigational bronchoscopy was scheduled.  02/06/2023: Ov with  NP for follow-up after bronchoscopy 01/26/2023 complicated by postprocedural left pneumothorax requiring chest tube and admission.  He clinically improved and was discharged on 01/27/2023.  Cytology showed adenocarcinoma of the right upper lobe pulmonary nodule.  Left upper lobe and lingular nodules have not been officially reported out but the left upper lobe nodule showed atypical cells and the lingular nodule was suspicious for malignancy.  Findings are concerning for  probably at least 2 synchronous primaries, possibly 3. Today, he tells me that his breathing has been doing well since he was discharged.  He has not been having any shortness of breath, chest discomfort/tightness, wheezing or cough.  No hemoptysis.  He has had some weight loss since he was here last.  Relates it to decreased appetite due to life stressors.  He unfortunately lost his wife last week and is understandably going through a difficult grieving process.  No SI/HI.  Here today to discuss next steps regarding his bronchoscopy findings.  He was already referred to medical oncology but does not have a upcoming appointment scheduled yet.    05/19/2023: Today - follow up Patient presents today for follow up. Doing well since he was here last. He recently complete SBRT for synchronous stage Ia adenocarcinoma of RUL as well as suspicious stage IIb non-small cell involving lingula and LUL. He has repeat CT chest scheduled for September and follow up with oncology. He tolerated radiation treatments well. Had some associated fatigue that has resolved. No difficulties with his breathing. No cough, hemoptysis. Weight loss has stabilized. Going to grief counseling after the passing of his wife. Enjoying his sessions with his counselor.   Allergies  Allergen Reactions   Atacand [Candesartan] Anaphylaxis   Amlodipine     Swelling, joint and muscle pain    Mirtazapine Other (See Comments)    Dizziness, disorientation    Monopril [Fosinopril] Cough   Naproxen Other (See Comments)    Abdominal pain    Niaspan [Niacin Er] Rash    Immunization History  Administered Date(s) Administered   PFIZER(Purple Top)SARS-COV-2 Vaccination 11/18/2019, 12/13/2019    Past Medical History:  Diagnosis Date   Anxiety    Arthritis    Cancer (HCC)    Prostate and melanoma   Depression    DVT (deep venous thrombosis) (HCC)    right lower leg   GERD (gastroesophageal reflux disease)    History of radiation therapy     Right lung- 03/30/23-04/10/23- Dr. Antony Blackbird   Hypertension     Tobacco History: Social History   Tobacco Use  Smoking Status Never   Passive exposure: Past  Smokeless Tobacco Never   Counseling given: Not Answered   Outpatient Medications Prior to Visit  Medication Sig Dispense Refill   acetaminophen (TYLENOL) 500 MG tablet Take 1,000 mg by mouth every 6 (six) hours as needed for moderate pain.     atorvastatin (LIPITOR) 40 MG tablet Take 40 mg by mouth daily.     buPROPion (WELLBUTRIN SR) 150 MG 12 hr tablet Take 150 mg by mouth 3 (three) times daily.     Calcium Citrate-Vitamin D (CALCIUM + D PO) Take 2 tablets by mouth daily.     clonazePAM (KLONOPIN) 1 MG tablet Take 1 mg by mouth 2 (two) times daily.      diphenhydramine-acetaminophen (TYLENOL PM) 25-500 MG TABS tablet Take 2 tablets by mouth at bedtime.     lamoTRIgine (LAMICTAL) 100 MG tablet Take 100 mg by mouth 2 (two) times daily.     metoprolol succinate (TOPROL-XL) 100 MG 24 hr tablet Take 100 mg by mouth daily. Take with or immediately following a meal.     Multiple Vitamin (MULTIVITAMIN WITH MINERALS) TABS tablet Take 1 tablet by mouth daily.     ondansetron (ZOFRAN-ODT) 4 MG disintegrating tablet Take 1 tablet (4 mg total) by mouth every 8 (eight) hours as needed for nausea or vomiting. 20 tablet 0   rivaroxaban (XARELTO) 10 MG TABS tablet Take 1 tablet (10 mg total) by mouth daily. 30 tablet 0   tamsulosin (FLOMAX) 0.4 MG CAPS capsule Take 1 capsule (0.4 mg total) by mouth daily. 14 capsule 0   No facility-administered medications prior to visit.     Review of Systems:   Constitutional: No night sweats, fevers, chills, fatigue, or lassitude. + Weight loss HEENT: No headaches, difficulty swallowing, tooth/dental problems, or sore throat. No sneezing, itching, ear ache, nasal congestion, or post nasal drip CV:  No chest pain, orthopnea, PND, swelling in lower extremities, anasarca, dizziness, palpitations,  syncope Resp: No shortness of breath with exertion or at rest. No excess mucus or change in color of mucus. No productive or non-productive. No hemoptysis. No wheezing.  No chest wall deformity GI:  No heartburn, indigestion Skin: No rash, lesions, ulcerations MSK:  No joint pain or swelling.   Neuro: No dizziness or lightheadedness.  Psych: + Grief.  No SI/HI. mood stable.     Physical Exam:  BP 112/62   Pulse (!) 55   Ht 5' 9.5" (1.765 m)   Wt 198 lb 12.8 oz (90.2 kg)   SpO2 98%   BMI 28.94 kg/m   GEN: Pleasant, interactive, well-kempt; obese; in no acute distress. HEENT:  Normocephalic and atraumatic. PERRLA. Sclera white. Nasal turbinates pink, moist and patent bilaterally. No rhinorrhea present. Oropharynx pink and moist, without exudate or edema. No lesions, ulcerations, or postnasal drip.  NECK:  Supple w/ fair ROM. No JVD present. Normal carotid impulses w/o bruits. Thyroid symmetrical with no goiter or nodules  palpated. No lymphadenopathy.   CV: RRR, no m/r/g, no peripheral edema. Pulses intact, +2 bilaterally. No cyanosis, pallor or clubbing. PULMONARY:  Unlabored, regular breathing. Clear bilaterally A&P w/o wheezes/rales/rhonchi. No accessory muscle use.  GI: BS present and normoactive. Soft, non-tender to palpation. No organomegaly or masses detected.  MSK: No erythema, warmth or tenderness. Cap refil <2 sec all extrem. No deformities or joint swelling noted.  Neuro: A/Ox3. No focal deficits noted.   Skin: Warm, no lesions or rashe Psych: Normal affect and behavior. Judgement and thought content appropriate.     Lab Results:  CBC    Component Value Date/Time   WBC 7.4 03/24/2023 0507   RBC 4.20 (L) 03/24/2023 0507   HGB 13.1 03/24/2023 0507   HGB 15.7 02/23/2023 0934   HCT 41.0 03/24/2023 0507   PLT 201 03/24/2023 0507   PLT 170 02/23/2023 0934   MCV 97.6 03/24/2023 0507   MCH 31.2 03/24/2023 0507   MCHC 32.0 03/24/2023 0507   RDW 12.9 03/24/2023 0507    LYMPHSABS 0.6 (L) 03/23/2023 0856   MONOABS 1.0 03/23/2023 0856   EOSABS 0.0 03/23/2023 0856   BASOSABS 0.0 03/23/2023 0856    BMET    Component Value Date/Time   NA 136 03/24/2023 0507   K 4.8 03/24/2023 0507   CL 104 03/24/2023 0507   CO2 22 03/24/2023 0507   GLUCOSE 140 (H) 03/24/2023 0507   BUN 21 03/24/2023 0507   CREATININE 1.48 (H) 03/24/2023 0507   CREATININE 1.15 02/23/2023 0934   CALCIUM 8.6 (L) 03/24/2023 0507   GFRNONAA 51 (L) 03/24/2023 0507   GFRNONAA >60 02/23/2023 0934   GFRAA 70 (L) 06/08/2014 1653    BNP No results found for: "BNP"   Imaging:  No results found.  Administration History     None           No data to display          No results found for: "NITRICOXIDE"      Assessment & Plan:   Adenocarcinoma of right lung (HCC) Complete SBRT for synchronous stage Ia adenocarcinoma of RUL as well as suspicious stage IIb non-small cell involving lingula and LUL. Tolerated treatments well. Restaging CT planned for 06/2023. Follow up with radiation/oncology and oncology as scheduled. No respiratory concerns.  Patient Instructions  Attend CT chest next month, as ordered by oncology  Follow up with Dr. Arbutus Ped and Dr. Roselind Messier as scheduled   Follow up with Dr. Delton Coombes in 6 months, or sooner if you develop any new respiratory symptoms/concerns     I spent 25 minutes of dedicated to the care of this patient on the date of this encounter to include pre-visit review of records, face-to-face time with the patient discussing conditions above, post visit ordering of testing, clinical documentation with the electronic health record, making appropriate referrals as documented, and communicating necessary findings to members of the patients care team.  Noemi Chapel, NP 05/19/2023  Pt aware and understands NP's role.

## 2023-05-19 NOTE — Patient Instructions (Addendum)
Attend CT chest next month, as ordered by oncology  Follow up with Dr. Arbutus Ped and Dr. Roselind Messier as scheduled   Follow up with Dr. Delton Coombes in 6 months, or sooner if you develop any new respiratory symptoms/concerns

## 2023-05-19 NOTE — Assessment & Plan Note (Addendum)
Complete SBRT for synchronous stage Ia adenocarcinoma of RUL as well as suspicious stage IIb non-small cell involving lingula and LUL. Tolerated treatments well. Restaging CT planned for 06/2023. Follow up with radiation/oncology and oncology as scheduled. No respiratory concerns.  Patient Instructions  Attend CT chest next month, as ordered by oncology  Follow up with Dr. Arbutus Ped and Dr. Roselind Messier as scheduled   Follow up with Dr. Delton Coombes in 6 months, or sooner if you develop any new respiratory symptoms/concerns

## 2023-06-02 DIAGNOSIS — F339 Major depressive disorder, recurrent, unspecified: Secondary | ICD-10-CM | POA: Diagnosis not present

## 2023-06-02 DIAGNOSIS — F5101 Primary insomnia: Secondary | ICD-10-CM | POA: Diagnosis not present

## 2023-06-02 DIAGNOSIS — F4321 Adjustment disorder with depressed mood: Secondary | ICD-10-CM | POA: Diagnosis not present

## 2023-06-16 ENCOUNTER — Ambulatory Visit (HOSPITAL_COMMUNITY)
Admission: RE | Admit: 2023-06-16 | Discharge: 2023-06-16 | Disposition: A | Discharge status: Home / Self Care | Payer: PPO | Source: Ambulatory Visit | Attending: Physician Assistant | Admitting: Physician Assistant

## 2023-06-16 ENCOUNTER — Inpatient Hospital Stay: Payer: PPO | Attending: Internal Medicine

## 2023-06-16 DIAGNOSIS — C3491 Malignant neoplasm of unspecified part of right bronchus or lung: Secondary | ICD-10-CM | POA: Insufficient documentation

## 2023-06-16 DIAGNOSIS — I700 Atherosclerosis of aorta: Secondary | ICD-10-CM | POA: Diagnosis not present

## 2023-06-16 DIAGNOSIS — R918 Other nonspecific abnormal finding of lung field: Secondary | ICD-10-CM | POA: Diagnosis not present

## 2023-06-16 DIAGNOSIS — Z85118 Personal history of other malignant neoplasm of bronchus and lung: Secondary | ICD-10-CM | POA: Insufficient documentation

## 2023-06-16 LAB — CBC WITH DIFFERENTIAL (CANCER CENTER ONLY)
Abs Immature Granulocytes: 0.03 10*3/uL (ref 0.00–0.07)
Basophils Absolute: 0 10*3/uL (ref 0.0–0.1)
Basophils Relative: 1 %
Eosinophils Absolute: 0 10*3/uL (ref 0.0–0.5)
Eosinophils Relative: 1 %
HCT: 42.8 % (ref 39.0–52.0)
Hemoglobin: 14.2 g/dL (ref 13.0–17.0)
Immature Granulocytes: 1 %
Lymphocytes Relative: 19 %
Lymphs Abs: 1 10*3/uL (ref 0.7–4.0)
MCH: 32.2 pg (ref 26.0–34.0)
MCHC: 33.2 g/dL (ref 30.0–36.0)
MCV: 97.1 fL (ref 80.0–100.0)
Monocytes Absolute: 0.5 10*3/uL (ref 0.1–1.0)
Monocytes Relative: 9 %
Neutro Abs: 3.5 10*3/uL (ref 1.7–7.7)
Neutrophils Relative %: 69 %
Platelet Count: 184 10*3/uL (ref 150–400)
RBC: 4.41 MIL/uL (ref 4.22–5.81)
RDW: 13.5 % (ref 11.5–15.5)
WBC Count: 5 10*3/uL (ref 4.0–10.5)
nRBC: 0 % (ref 0.0–0.2)

## 2023-06-16 LAB — CMP (CANCER CENTER ONLY)
ALT: 17 U/L (ref 0–44)
AST: 20 U/L (ref 15–41)
Albumin: 4.3 g/dL (ref 3.5–5.0)
Alkaline Phosphatase: 74 U/L (ref 38–126)
Anion gap: 5 (ref 5–15)
BUN: 14 mg/dL (ref 8–23)
CO2: 31 mmol/L (ref 22–32)
Calcium: 9.7 mg/dL (ref 8.9–10.3)
Chloride: 105 mmol/L (ref 98–111)
Creatinine: 1.15 mg/dL (ref 0.61–1.24)
GFR, Estimated: >60 mL/min (ref >60)
Glucose, Bld: 96 mg/dL (ref 70–99)
Potassium: 4.2 mmol/L (ref 3.5–5.1)
Sodium: 141 mmol/L (ref 135–145)
Total Bilirubin: 0.5 mg/dL (ref 0.3–1.2)
Total Protein: 7.2 g/dL (ref 6.5–8.1)

## 2023-06-16 MED ORDER — IOHEXOL 300 MG/ML  SOLN
75.0000 mL | Freq: Once | INTRAMUSCULAR | Status: AC | PRN
  Administered 2023-06-16: 75 mL via INTRAVENOUS

## 2023-06-17 DIAGNOSIS — L7211 Pilar cyst: Secondary | ICD-10-CM | POA: Diagnosis not present

## 2023-06-18 ENCOUNTER — Inpatient Hospital Stay: Payer: PPO | Admitting: Internal Medicine

## 2023-06-18 VITALS — BP 147/75 | HR 55 | Temp 98.2°F | Resp 17 | Ht 69.5 in | Wt 192.3 lb

## 2023-06-18 DIAGNOSIS — C3490 Malignant neoplasm of unspecified part of unspecified bronchus or lung: Secondary | ICD-10-CM | POA: Diagnosis not present

## 2023-06-18 DIAGNOSIS — Z85118 Personal history of other malignant neoplasm of bronchus and lung: Secondary | ICD-10-CM | POA: Diagnosis not present

## 2023-06-18 NOTE — Progress Notes (Signed)
[facility] [phone]   [phone]  OFFICE PROGRESS NOTE  Sigmund Hazel, MD [address] [place] [address]  DIAGNOSIS:  synchronous stage Ia (T1b, N0, M0) adenocarcinoma of the right upper lobe as well as suspicious stage IIb (T3, N0, M0) non-small cell lung cancer involving the lingula and left upper lobe diagnosed in April 2024.   PRIOR THERAPY: SBRT to the 3 pulmonary nodules under the care of Dr. Roselind Messier  CURRENT THERAPY: Observation  INTERVAL HISTORY: Richard Delacruz 72 y.o. male returns to the clinic today for follow-up visit.  The patient is feeling fine today with no concerning complaints.  He tolerated the curative [place] with associated pulmonary nodules fairly well.  He denied having any chest pain, shortness of breath, cough or hemoptysis.  He has no nausea, vomiting, diarrhea or constipation.  He has no headache or visual changes.  He denied having any fever or chills.  He is here today for evaluation with repeat CT scan of the chest for restaging of his disease.  MEDICAL HISTORY: Past Medical History:  Diagnosis Date   Anxiety    Arthritis    Cancer (HCC)    Prostate and melanoma   Depression    DVT (deep venous thrombosis) (HCC)    right lower leg   GERD (gastroesophageal reflux disease)    History of radiation therapy    Right lung- 03/30/23-04/10/23- Dr. Antony Blackbird   Hypertension     ALLERGIES:  is allergic to atacand [candesartan], amlodipine, mirtazapine, monopril [fosinopril], naproxen, and niaspan [niacin er].  MEDICATIONS:  Current Outpatient Medications  Medication Sig Dispense Refill   acetaminophen (TYLENOL) 500 MG tablet Take 1,000 mg by mouth every 6 (six) hours as needed for moderate pain.     atorvastatin (LIPITOR) 40 MG tablet Take 40 mg by mouth daily.     buPROPion (WELLBUTRIN SR) 150 MG 12 hr tablet Take 150 mg by mouth 3 (three) times daily.     Calcium Citrate-Vitamin D (CALCIUM + D  PO) Take 2 tablets by mouth daily.     clonazePAM (KLONOPIN) 1 MG tablet Take 1 mg by mouth 2 (two) times daily.      diphenhydramine-acetaminophen (TYLENOL PM) 25-500 MG TABS tablet Take 2 tablets by mouth at bedtime.     lamoTRIgine (LAMICTAL) 100 MG tablet Take 100 mg by mouth 2 (two) times daily.     metoprolol succinate (TOPROL-XL) 100 MG 24 hr tablet Take 100 mg by mouth daily. Take with or immediately following a meal.     Multiple Vitamin (MULTIVITAMIN WITH MINERALS) TABS tablet Take 1 tablet by mouth daily.     ondansetron (ZOFRAN-ODT) 4 MG disintegrating tablet Take 1 tablet (4 mg total) by mouth every 8 (eight) hours as needed for nausea or vomiting. 20 tablet 0   rivaroxaban (XARELTO) 10 MG TABS tablet Take 1 tablet (10 mg total) by mouth daily. 30 tablet 0   No current facility-administered medications for this visit.    SURGICAL HISTORY:  Past Surgical History:  Procedure Laterality Date   ADENOIDECTOMY     BRONCHIAL BIOPSY  01/26/2023   Procedure: BRONCHIAL BIOPSIES;  Surgeon: Leslye Peer, MD;  Location: [facility] ENDOSCOPY;  Service: Pulmonary;;   BRONCHIAL BRUSHINGS  01/26/2023   Procedure: BRONCHIAL BRUSHINGS;  Surgeon: Leslye Peer, MD;  Location: [facility] ENDOSCOPY;  Service: Pulmonary;;   BRONCHIAL NEEDLE ASPIRATION BIOPSY  01/26/2023   Procedure: BRONCHIAL NEEDLE ASPIRATION BIOPSIES;  Surgeon:  Leslye Peer, MD;  Location: [facility] ENDOSCOPY;  Service: Pulmonary;;   COLONOSCOPY     CYSTOSCOPY/URETEROSCOPY/HOLMIUM LASER/STENT PLACEMENT Left 03/23/2023   Procedure: CYSTOSCOPY/URETEROSCOPY/HOLMIUM LASER/STENT PLACEMENT;  Surgeon: Bjorn Pippin, MD;  Location: [facility] ORS;  Service: Urology;  Laterality: Left;   TONSILLECTOMY     VIDEO BRONCHOSCOPY WITH RADIAL ENDOBRONCHIAL ULTRASOUND  01/26/2023   Procedure: VIDEO BRONCHOSCOPY WITH RADIAL ENDOBRONCHIAL ULTRASOUND;  Surgeon: Leslye Peer, MD;  Location: [facility] ENDOSCOPY;  Service: Pulmonary;;    REVIEW OF SYSTEMS:  A comprehensive review of  systems was negative.   PHYSICAL EXAMINATION: General appearance: alert, cooperative, and no distress Head: Normocephalic, without obvious abnormality, atraumatic Neck: no adenopathy, no carotid bruit, supple, symmetrical, trachea midline, and thyroid not enlarged, symmetric, no tenderness/mass/nodules Lymph nodes: Cervical, supraclavicular, and axillary nodes normal. Resp: clear to auscultation bilaterally Back: symmetric, no curvature. ROM normal. No CVA tenderness. Cardio: regular rate and rhythm, S1, S2 normal, no murmur, click, rub or gallop GI: soft, non-tender; bowel sounds normal; no masses,  no organomegaly Extremities: extremities normal, atraumatic, no cyanosis or edema  ECOG PERFORMANCE STATUS: 1 - Symptomatic but completely ambulatory  Blood pressure (!) 147/75, pulse (!) 55, temperature 98.2 F (36.8 C), temperature source Oral, resp. rate 17, height 5' 9.5" (1.765 m), weight 192 lb 4.8 oz (87.2 kg), SpO2 98%.  LABORATORY DATA: Lab Results  Component Value Date   WBC 5.0 06/16/2023   HGB 14.2 06/16/2023   HCT 42.8 06/16/2023   MCV 97.1 06/16/2023   PLT 184 06/16/2023      Chemistry      Component Value Date/Time   NA 141 06/16/2023 0845   K 4.2 06/16/2023 0845   CL 105 06/16/2023 0845   CO2 31 06/16/2023 0845   BUN 14 06/16/2023 0845   CREATININE 1.15 06/16/2023 0845      Component Value Date/Time   CALCIUM 9.7 06/16/2023 0845   ALKPHOS 74 06/16/2023 0845   AST 20 06/16/2023 0845   ALT 17 06/16/2023 0845   BILITOT 0.5 06/16/2023 0845       RADIOGRAPHIC STUDIES: CT Chest W Contrast  Result Date: 06/18/2023 CLINICAL DATA:  Lung cancer restaging * Tracking Code: BO * EXAM: CT CHEST WITH CONTRAST TECHNIQUE: Multidetector CT imaging of the chest was performed during intravenous contrast administration. RADIATION DOSE REDUCTION: This exam was performed according to the departmental dose-optimization program which includes automated exposure control,  adjustment of the mA and/or kV according to patient size and/or use of iterative reconstruction technique. CONTRAST:  75mL OMNIPAQUE IOHEXOL 300 MG/ML  SOLN COMPARISON:  PET-CT, 02/12/2023 FINDINGS: Cardiovascular: Aortic atherosclerosis. Normal heart size. Left coronary artery calcifications. No pericardial effusion. Mediastinum/Nodes: No enlarged mediastinal, hilar, or axillary lymph nodes. Thyroid gland, trachea, and esophagus demonstrate no significant findings. Lungs/Pleura: Essentially complete resolution of a previously seen medial right upper lobe PET avid pulmonary nodule with only an irregular residual (series 5, image 60). Diminished size of a lingular nodule now irregular, measuring 0.9 x 0.7 cm, previously 1.3 x 1.0 cm (series 5, image 105). Diminished size of a nodule of the medial left upper lobe, measuring no greater than 0.5 cm, previously 1.1 x 0.5 cm (series 5, image 53). Diminished size of a left lower lobe nodule measuring 1.0 x 0.3 cm, previously 1.1 x 0.6 cm (series 5, image 116). Unchanged small nodule of the dependent left lower lobe measuring 0.5 cm, incidental and benign (series 5, image 123) and 1.0 cm (series 5, image 119). New irregular ground-glass opacities,  including of the posterior right upper lobe (series 5, image 61) and dependent left lower lobe (series 5, image 124). No pleural effusion or pneumothorax. Upper Abdomen: No acute abnormality.  Hepatic steatosis. Musculoskeletal: No chest wall abnormality. No acute osseous findings. IMPRESSION: 1. Essentially complete resolution of a previously seen medial right upper lobe PET avid pulmonary nodule with only an irregular residual. Diminished size of multiple additional nodules. Findings consistent with treatment response. 2. New irregular ground-glass opacities, including of the posterior right upper lobe and dependent left lower lobe, nonspecific and infectious or inflammatory. 3. Coronary artery disease. 4. Hepatic steatosis.  Aortic Atherosclerosis (ICD10-I70.0). Electronically Signed   By: Jearld Lesch M.D.   On: 06/18/2023 09:09    ASSESSMENT AND PLAN: This is a very pleasant 72 years old white male with  synchronous stage Ia (T1b, N0, M0) adenocarcinoma of the right upper lobe as well as suspicious stage IIb (T3, N0, M0) non-small cell lung cancer involving the lingula and left upper lobe diagnosed in April 2024.  He is status post SBRT to the 3 pulmonary nodules under the care of Dr. Roselind Messier He is currently on observation and feeling fine. He had repeat CT scan of the chest performed recently.  I personally reviewed the scan and discussed the result with the patient today.  His scan showed complete resolution of the previously seen medial right upper lobe PET avid pulmonary nodule with only an irregular residual.  There was also diminished size of multiple additional nodules consistent with treatment response. I recommended for the patient to continue on observation with repeat CT scan of the chest in 6 months. The patient was advised to call immediately if she has any other concerning symptoms in the interval.  The patient voices understanding of current disease status and treatment options and is in agreement with the current care plan.  All questions were answered. The patient knows to call the clinic with any problems, questions or concerns. We can certainly see the patient much sooner if necessary.  The total time spent in the appointment was 20 minutes.  Disclaimer: This note was dictated with voice recognition software. Similar sounding words can inadvertently be transcribed and may not be corrected upon review.

## 2023-06-29 DIAGNOSIS — C61 Malignant neoplasm of prostate: Secondary | ICD-10-CM | POA: Diagnosis not present

## 2023-06-29 DIAGNOSIS — N201 Calculus of ureter: Secondary | ICD-10-CM | POA: Diagnosis not present

## 2023-07-22 DIAGNOSIS — H9193 Unspecified hearing loss, bilateral: Secondary | ICD-10-CM | POA: Insufficient documentation

## 2023-07-22 DIAGNOSIS — H6123 Impacted cerumen, bilateral: Secondary | ICD-10-CM | POA: Diagnosis not present

## 2023-08-21 DIAGNOSIS — M79671 Pain in right foot: Secondary | ICD-10-CM | POA: Diagnosis not present

## 2023-08-21 DIAGNOSIS — M2141 Flat foot [pes planus] (acquired), right foot: Secondary | ICD-10-CM | POA: Diagnosis not present

## 2023-08-21 DIAGNOSIS — C61 Malignant neoplasm of prostate: Secondary | ICD-10-CM | POA: Diagnosis not present

## 2023-08-25 DIAGNOSIS — C61 Malignant neoplasm of prostate: Secondary | ICD-10-CM | POA: Diagnosis not present

## 2023-09-08 DIAGNOSIS — C61 Malignant neoplasm of prostate: Secondary | ICD-10-CM | POA: Diagnosis not present

## 2023-09-09 ENCOUNTER — Other Ambulatory Visit (HOSPITAL_COMMUNITY): Payer: Self-pay | Admitting: Urology

## 2023-09-09 DIAGNOSIS — R9721 Rising PSA following treatment for malignant neoplasm of prostate: Secondary | ICD-10-CM

## 2023-09-15 DIAGNOSIS — M25871 Other specified joint disorders, right ankle and foot: Secondary | ICD-10-CM | POA: Diagnosis not present

## 2023-09-15 DIAGNOSIS — M778 Other enthesopathies, not elsewhere classified: Secondary | ICD-10-CM | POA: Diagnosis not present

## 2023-09-15 DIAGNOSIS — M79671 Pain in right foot: Secondary | ICD-10-CM | POA: Diagnosis not present

## 2023-09-16 ENCOUNTER — Encounter (HOSPITAL_COMMUNITY): Payer: Self-pay

## 2023-09-17 ENCOUNTER — Encounter (HOSPITAL_COMMUNITY): Payer: PPO

## 2023-09-22 LAB — CYTOLOGY - NON PAP

## 2023-09-24 ENCOUNTER — Ambulatory Visit (HOSPITAL_COMMUNITY)
Admission: RE | Admit: 2023-09-24 | Discharge: 2023-09-24 | Disposition: A | Discharge status: Home / Self Care | Payer: PPO | Source: Ambulatory Visit | Attending: Urology | Admitting: Urology

## 2023-09-24 DIAGNOSIS — C801 Malignant (primary) neoplasm, unspecified: Secondary | ICD-10-CM | POA: Diagnosis not present

## 2023-09-24 DIAGNOSIS — C3490 Malignant neoplasm of unspecified part of unspecified bronchus or lung: Secondary | ICD-10-CM | POA: Diagnosis not present

## 2023-09-24 DIAGNOSIS — R9721 Rising PSA following treatment for malignant neoplasm of prostate: Secondary | ICD-10-CM | POA: Insufficient documentation

## 2023-09-24 MED ORDER — FLOTUFOLASTAT F 18 GALLIUM 296-5846 MBQ/ML IV SOLN
8.7800 | Freq: Once | INTRAVENOUS | Status: AC
  Administered 2023-09-24: 8.78 via INTRAVENOUS

## 2023-10-02 DIAGNOSIS — F4321 Adjustment disorder with depressed mood: Secondary | ICD-10-CM | POA: Diagnosis not present

## 2023-10-02 DIAGNOSIS — F339 Major depressive disorder, recurrent, unspecified: Secondary | ICD-10-CM | POA: Diagnosis not present

## 2023-10-02 DIAGNOSIS — H9113 Presbycusis, bilateral: Secondary | ICD-10-CM | POA: Diagnosis not present

## 2023-10-02 DIAGNOSIS — Z6827 Body mass index (BMI) 27.0-27.9, adult: Secondary | ICD-10-CM | POA: Diagnosis not present

## 2023-10-02 DIAGNOSIS — C3491 Malignant neoplasm of unspecified part of right bronchus or lung: Secondary | ICD-10-CM | POA: Diagnosis not present

## 2023-10-02 DIAGNOSIS — C61 Malignant neoplasm of prostate: Secondary | ICD-10-CM | POA: Diagnosis not present

## 2023-10-02 DIAGNOSIS — F411 Generalized anxiety disorder: Secondary | ICD-10-CM | POA: Diagnosis not present

## 2023-10-08 NOTE — Progress Notes (Signed)
 GU Location of Tumor / Histology: Prostate Ca  If Prostate Cancer, Gleason Score is (4 + 4) and PSA is (13.67 on 09/08/2023)  Biopsies     09/24/2023 Dr. Morene Salines NM PET (PSMA) Skull to Mid Thigh CLINICAL DATA:  Increasing PSA after radiation therapy for prostate cancer. PSA of 13.7. prior history includes lung cancer.   IMPRESSION: 1. Right posterolateral prostatic tracer affinity at the midgland level is relatively mild and nonspecific. Cannot exclude local recurrence. 2. No tracer avid metastatic disease. 3. Incidental findings, including: Left nephrolithiasis. Coronary artery atherosclerosis. Aortic Atherosclerosis (ICD10-I70.0). Presumed treated left upper lobe primary bronchogenic carcinoma. 4. Decreased size of right upper lobe pulmonary nodules.   Past/Anticipated interventions by urology, if any: NA  Past/Anticipated interventions by medical oncology, if any:   06/18/2023 Dr. Sherrod   Weight changes, if any: Weight loss of 60 lbs intended per patient.  IPSS:  14 SHIM:  5  Bowel/Bladder complaints, if any:  No bowel complaints.  Restricted flow of urine, weak stream take longer to empty bladder.  Since kidney stone removal last summer to right kidney has had issues.  Nausea/Vomiting, if any: No  Pain issues, if any:  0/10  SAFETY ISSUES: Prior radiation? Yes  03/31/23 - 04/10/23: SBRT Right upper lung - 50 Gy delivered in 5 Fx at 10 Gy/Fx.  Left upper lung - 50 Gy delivered in 5 Fx at 10 Gy/Fx.  Left lower lung - 60 Gy delivered in 5 Fx at 12 Gy/Fx. (Dr. Shannon.  07/03/10 - 09/19/10: Prostate and seminal vesicles / 81 Gy in 45 fractions. Pacemaker/ICD? No Possible current pregnancy? Male Is the patient on methotrexate? No  Current Complaints / other details:

## 2023-10-12 NOTE — Progress Notes (Signed)
 Radiation Oncology         [phone] ________________________________  Initial Outpatient Consultation  Name: Richard Delacruz MRN: [id number]  Date: 10/13/2023  DOB: Feb 21, 1951  RR:Fpoozm, Olam, MD  Cam Morene ORN, MD   REFERRING PHYSICIAN: Cam Morene ORN, MD  DIAGNOSIS: 73 y.o. gentleman with locally recurrent metastatic prostate cancer with Gleason score of 4+4, and PSA of 13.67, s/p EBRT 09/2010.    ICD-10-CM   1. Recurrent prostate adenocarcinoma (HCC)  C61       HISTORY OF PRESENT ILLNESS: Richard Delacruz is a 73 y.o. male with a diagnosis of recurrent prostate cancer. In summary, he was initially diagnosed with Gleason 3+4 prostate cancer in 06/2010 by Dr. Rogers in [place], [place]. He was subsequently treated with 8 weeks of EBRT, completed in 09/2010.  The patient recollects that his external radiation was proton therapy near [place], but, I think there may be a question about whether this was IMRT, since there are no Proton Therapy facilities in [place] .  We don't have records to confirm. His PSA reportedly reached a nadir of 3.12 but subsequently rose again to 6.1 by 02/2013. He moved to [facility] and established care with Dr. Cam in 2015. He was noted to have a significant rise in PSA up to 21.17, and restaging CT scans revealed pulmonary metastases. He received three doses of ADT with Lupron, in 04/2014, 07/2014, and 02/2015, but he did not tolerate due to side effects. Both his PSA and the lung nodules responded to the ADT, with his PSA dropping to <0.1 by 01/2015 and repeat chest CT in 08/2014 showing near resolution of the lung nodules.  Since that time, his PSA has slowly risen and reached 17 by 11/2022. He underwent a PSMA PET scan on 12/18/22 showing no PSMA-avid disease but new and enlarging pulmonary nodules without tracer activity. He was referred to pulmonology and underwent bronchoscopy on 01/26/23 under the care of Dr. Shelah. Pathology from the RUL revealed  adenocarcinoma, with samples from the lingula and LUL showing atypical cells. He was treated with SBRT x3 fractions under the care of Dr. Shannon- completed 04/10/23. More recently, additional immunohistochemical stains were requested and performed on the samples, revealing positive prostate cancer markers consistent with prostatic origin. A follow up CT Chest in 06/2023 showed an excellent response to treatment with essentially complete resolution of the previously seen medial RUL nodule and diminished size of other additional nodules without evidence of disease progression.  The current plan is to continue in routine follow-up under the care and direction of Dr. Gatha for continued management of the lung disease.  He had a repeat PSA on 08/24/23 showing persistent elevation at 13.67.  Therefore, the patient proceeded to transrectal ultrasound with 12 biopsies of the prostate on 08/25/23.  The prostate volume measured 30.5 cc.  Out of 12 core biopsies, 8 were positive.  The maximum Gleason score was 4+4, and this was seen in the right base, right mid, right base lateral, right mid lateral, and right apex lateral. Additionally, a small focus of Gleason 4+3 was seen in the left mid lateral, and small foci of Gleason 3+3 in the left apex lateral and left base.   He also underwent a repeat PSMA PET scan on 09/24/23 showing nonspecific tracer affinity in the right posterolateral midgland without evidence of tracer-avid metastatic disease.  The patient reviewed the recent biopsy and imaging results with his urologist and he has kindly been referred today for discussion of  potential radiation treatment options.    PREVIOUS RADIATION THERAPY: Yes  03/31/23 - 04/10/23: SBRT  1) Right upper lung - 50 Gy delivered in 5 Fx at 10 Gy/Fx 2) Left upper lung - 50 Gy delivered in 5 Fx at 10 Gy/Fx 3) Left lower lung - 60 Gy delivered in 5 Fx at 12 Gy/Fx  07/03/10 - 09/19/10: Prostate and seminal vesicles / 81 Gy in 45  fractions ([place], [place])  PAST MEDICAL HISTORY:  Past Medical History:  Diagnosis Date   Anxiety    Arthritis    Cancer (HCC)    Prostate and melanoma   Depression    DVT (deep venous thrombosis) (HCC)    right lower leg   GERD (gastroesophageal reflux disease)    History of radiation therapy    Right lung- 03/30/23-04/10/23- Dr. Lynwood Nasuti   Hypertension       PAST SURGICAL HISTORY: Past Surgical History:  Procedure Laterality Date   ADENOIDECTOMY     BRONCHIAL BIOPSY  01/26/2023   Procedure: BRONCHIAL BIOPSIES;  Surgeon: Shelah Lamar RAMAN, MD;  Location: [facility] ENDOSCOPY;  Service: Pulmonary;;   BRONCHIAL BRUSHINGS  01/26/2023   Procedure: BRONCHIAL BRUSHINGS;  Surgeon: Shelah Lamar RAMAN, MD;  Location: [facility] ENDOSCOPY;  Service: Pulmonary;;   BRONCHIAL NEEDLE ASPIRATION BIOPSY  01/26/2023   Procedure: BRONCHIAL NEEDLE ASPIRATION BIOPSIES;  Surgeon: Shelah Lamar RAMAN, MD;  Location: [facility] ENDOSCOPY;  Service: Pulmonary;;   COLONOSCOPY     CYSTOSCOPY/URETEROSCOPY/HOLMIUM LASER/STENT PLACEMENT Left 03/23/2023   Procedure: CYSTOSCOPY/URETEROSCOPY/HOLMIUM LASER/STENT PLACEMENT;  Surgeon: Watt Rush, MD;  Location: [facility] ORS;  Service: Urology;  Laterality: Left;   TONSILLECTOMY     VIDEO BRONCHOSCOPY WITH RADIAL ENDOBRONCHIAL ULTRASOUND  01/26/2023   Procedure: VIDEO BRONCHOSCOPY WITH RADIAL ENDOBRONCHIAL ULTRASOUND;  Surgeon: Shelah Lamar RAMAN, MD;  Location: [facility] ENDOSCOPY;  Service: Pulmonary;;    FAMILY HISTORY: No family history on file.  SOCIAL HISTORY: He still enjoys walking, recumbent cycling and weight training regularly. Social History   Socioeconomic History   Marital status: Widowed    Spouse name: Not on file   Number of children: Not on file   Years of education: Not on file   Highest education level: Not on file  Occupational History   Not on file  Tobacco Use   Smoking status: Never    Passive exposure: Past   Smokeless tobacco: Never  Vaping Use   Vaping status: Never Used   Substance and Sexual Activity   Alcohol use: No   Drug use: No   Sexual activity: Not Currently  Other Topics Concern   Not on file  Social History Narrative   Not on file   Social Drivers of Health   Financial Resource Strain: Not on file  Food Insecurity: No Food Insecurity (10/13/2023)   Hunger Vital Sign    Worried About Running Out of Food in the Last Year: Never true    Ran Out of Food in the Last Year: Never true  Transportation Needs: No Transportation Needs (10/13/2023)   PRAPARE - Administrator, Civil Service (Medical): No    Lack of Transportation (Non-Medical): No  Physical Activity: Not on file  Stress: Not on file  Social Connections: Not on file  Intimate Partner Violence: Not At Risk (10/13/2023)   Humiliation, Afraid, Rape, and Kick questionnaire    Fear of Current or Ex-Partner: No    Emotionally Abused: No    Physically Abused: No    Sexually Abused: No  ALLERGIES: Atacand [candesartan], Amlodipine, Mirtazapine, Monopril [fosinopril], Naproxen, Trazodone, and Niaspan [niacin er (antihyperlipidemic)]  MEDICATIONS:  Current Outpatient Medications  Medication Sig Dispense Refill   COMIRNATY syringe      FLUZONE HIGH-DOSE 0.5 ML injection      gabapentin (NEURONTIN) 100 MG capsule Take 100-300 mg by mouth at bedtime.     acetaminophen  (TYLENOL ) 500 MG tablet Take 1,000 mg by mouth every 6 (six) hours as needed for moderate pain.     atorvastatin  (LIPITOR) 40 MG tablet Take 40 mg by mouth daily.     buPROPion  (WELLBUTRIN  SR) 150 MG 12 hr tablet Take 150 mg by mouth 3 (three) times daily.     Calcium  Citrate-Vitamin D (CALCIUM  + D PO) Take 2 tablets by mouth daily.     clonazePAM  (KLONOPIN ) 1 MG tablet Take 1 mg by mouth 2 (two) times daily.      diphenhydramine -acetaminophen  (TYLENOL  PM) 25-500 MG TABS tablet Take 2 tablets by mouth at bedtime.     lamoTRIgine  (LAMICTAL ) 100 MG tablet Take 100 mg by mouth 2 (two) times daily.     metoprolol   succinate (TOPROL -XL) 100 MG 24 hr tablet Take 100 mg by mouth daily. Take with or immediately following a meal.     Multiple Vitamin (MULTIVITAMIN WITH MINERALS) TABS tablet Take 1 tablet by mouth daily.     rivaroxaban  (XARELTO ) 10 MG TABS tablet Take 1 tablet (10 mg total) by mouth daily. 30 tablet 0   No current facility-administered medications for this encounter.    REVIEW OF SYSTEMS:  On review of systems, the patient reports that he is doing well overall. He denies any chest pain, shortness of breath, cough, fevers, chills, night sweats, unintended weight changes. He denies any bowel disturbances, and denies abdominal pain, nausea or vomiting. He denies any new musculoskeletal or joint aches or pains. His IPSS was 14, indicating moderate urinary symptoms with a weak flow of stream, intermittency and frequency. His SHIM was 5, indicating he has severe erectile dysfunction. A complete review of systems is obtained and is otherwise negative.    PHYSICAL EXAM:  Wt Readings from Last 3 Encounters:  10/13/23 182 lb (82.6 kg)  10/13/23 182 lb (82.6 kg)  06/18/23 192 lb 4.8 oz (87.2 kg)   Temp Readings from Last 3 Encounters:  10/13/23 98.1 F (36.7 C)  10/13/23 98.1 F (36.7 C) (Oral)  06/18/23 98.2 F (36.8 C) (Oral)   BP Readings from Last 3 Encounters:  10/13/23 (!) 151/88  10/13/23 (!) 151/88  06/18/23 (!) 147/75   Pulse Readings from Last 3 Encounters:  10/13/23 (!) 53  10/13/23 (!) 53  06/18/23 (!) 55   Pain Assessment Pain Score: 0-No pain/10  In general this is a well appearing Caucasian male in no acute distress. He's alert and oriented x4 and appropriate throughout the examination. Cardiopulmonary assessment is negative for acute distress, and he exhibits normal effort.     KPS = 100  100 - Normal; no complaints; no evidence of disease. 90   - Able to carry on normal activity; minor signs or symptoms of disease. 80   - Normal activity with effort; some signs or  symptoms of disease. 45   - Cares for self; unable to carry on normal activity or to do active work. 60   - Requires occasional assistance, but is able to care for most of his personal needs. 50   - Requires considerable assistance and frequent medical care. 40   -  Disabled; requires special care and assistance. 30   - Severely disabled; hospital admission is indicated although death not imminent. 20   - Very sick; hospital admission necessary; active supportive treatment necessary. 10   - Moribund; fatal processes progressing rapidly. 0     - Dead  Karnofsky DA, Abelmann WH, Craver LS and Burchenal [facility] [phone]) The use of the nitrogen mustards in the palliative treatment of carcinoma: with particular reference to bronchogenic carcinoma Cancer 1 634-56  LABORATORY DATA:  Lab Results  Component Value Date   WBC 5.0 06/16/2023   HGB 14.2 06/16/2023   HCT 42.8 06/16/2023   MCV 97.1 06/16/2023   PLT 184 06/16/2023   Lab Results  Component Value Date   NA 141 06/16/2023   K 4.2 06/16/2023   CL 105 06/16/2023   CO2 31 06/16/2023   Lab Results  Component Value Date   ALT 17 06/16/2023   AST 20 06/16/2023   ALKPHOS 74 06/16/2023   BILITOT 0.5 06/16/2023     RADIOGRAPHY: NM PET (PSMA) SKULL TO MID THIGH Result Date: 10/12/2023 CLINICAL DATA:  Increasing PSA after radiation therapy for prostate cancer. PSA of 13.7. prior history includes lung cancer. EXAM: NUCLEAR MEDICINE PET SKULL BASE TO THIGH TECHNIQUE: 8.8 mCi Flotufolastat (Posluma ) was injected intravenously. Full-ring PET imaging was performed from the skull base to thigh after the radiotracer. CT data was obtained and used for attenuation correction and anatomic localization. COMPARISON:  12/18/2022.  Chest CT 06/16/2023.  FDG PET 02/12/2023. FINDINGS: NECK No radiotracer activity in neck lymph nodes. Incidental CT finding: No cervical adenopathy. CHEST No radiotracer accumulation within mediastinal or hilar lymph nodes. Low-level  tracer affinity including at a S.U.V. max of 3.1 corresponds to presumed radiation induced fibrosis within the anterior left upper lobe, including on 68/4. Incidental CT finding: Aortic and coronary artery calcification. A right apical 5 mm nodule on 55/4 similar to slightly decreased from 7 mm on the prior of 02/12/2023. anteromedial right upper lobe 6 mm nodule on 69/4 measured 13 mm on 02/12/2023. No new pulmonary nodules. ABDOMEN/PELVIS Prostate: Right posterolateral tracer affinity is relatively mild at the mid gland level, measuring a S.U.V. max of 4.5. Lymph nodes: No abnormal radiotracer accumulation within pelvic or abdominal nodes. Liver: No evidence of liver metastasis. Incidental CT finding: Normal adrenal glands. Punctate interpolar left renal collecting system calculus. Left renal low-density masses of up to 4.2 cm are most consistent with cysts . In the absence of clinically indicated signs/symptoms require(s) no independent follow-up. Abdominal aortic atherosclerosis. SKELETON No focal activity to suggest skeletal metastasis. A presumed osteochondroma off the posterior left iliac wing is similar at 2.5 cm. IMPRESSION: 1. Right posterolateral prostatic tracer affinity at the midgland level is relatively mild and nonspecific. Cannot exclude local recurrence. 2. No tracer avid metastatic disease. 3. Incidental findings, including: Left nephrolithiasis. Coronary artery atherosclerosis. Aortic Atherosclerosis (ICD10-I70.0). Presumed treated left upper lobe primary bronchogenic carcinoma. 4. Decreased size of right upper lobe pulmonary nodules. Electronically Signed   By: Rockey Kilts M.D.   On: 10/12/2023 12:07      IMPRESSION/PLAN: 1. 73 y.o. gentleman with locally recurrent, Gleason 4+4 adenocarcinoma of the prostate and PSA of 13.67, s/p EBRT 09/2010. Today, we talked to the patient about the findings and workup thus far. We discussed the natural history of oligometastatic prostate cancer and  general treatment, highlighting the role of radiotherapy in the management.  He has already had treatment of the oligometastatic lung disease but still has  active, local recurrence within the prostate as seen on recent biopsy and PSMA PET scan.  We discussed the available radiation techniques, and focused on the details and logistics of delivery.  His case and imaging were reviewed in the recent multidisciplinary prostate cancer conference where consensus recommendation was to proceed with definitive treatment to the locally recurrent disease in the prostate using focused, stereotactic body radiotherapy (SBRT).  We reviewed the anticipated acute and late sequelae associated with radiation in this setting.  We also discussed the role of ADT in the management of of oligometastatic prostate cancer but, since he has not tolerated ADT well in the past, he elected to forego concurrent ADT and just reserve that for use if the PSA continues to rise despite radiation alone.  He was encouraged to ask questions that were answered to his stated satisfaction.   At the conclusion of our conversation, the patient is interested in moving forward with the recommended 5 fraction course of SBRT to the prostate without concurrent ADT.  He has freely signed written consent to proceed today in the office and a copy of this document will be placed in his medical record.  He is tentatively scheduled for CT simulation at 2:30 PM on Friday, 10/16/2023 so we will share our discussion with Dr. Cam and proceed with treatment planning accordingly.  He knows that he is welcome to call at anytime with any questions or concerns in the interim.  We enjoyed meeting him today and look forward to continuing to participate in his care.  We personally spent 70 minutes in this encounter including chart review, reviewing radiological studies, meeting face-to-face with the patient, entering orders and completing documentation.    Sabra MICAEL Rusk, PA-C    Donnice Barge, MD  [facility]  Radiation Oncology Direct Dial: [phone]  Fax: [phone] [email or web address]  Skype  LinkedIn

## 2023-10-13 ENCOUNTER — Ambulatory Visit
Admission: RE | Admit: 2023-10-13 | Discharge: 2023-10-13 | Disposition: A | Discharge status: Home / Self Care | Payer: PPO | Source: Ambulatory Visit | Attending: Radiation Oncology | Admitting: Radiation Oncology

## 2023-10-13 ENCOUNTER — Encounter: Payer: Self-pay | Admitting: Radiation Oncology

## 2023-10-13 VITALS — BP 151/88 | HR 53 | Temp 98.1°F | Resp 17 | Wt 182.0 lb

## 2023-10-13 VITALS — BP 151/88 | HR 53 | Temp 98.1°F | Resp 17 | Ht 69.5 in | Wt 182.0 lb

## 2023-10-13 DIAGNOSIS — R9721 Rising PSA following treatment for malignant neoplasm of prostate: Secondary | ICD-10-CM | POA: Insufficient documentation

## 2023-10-13 DIAGNOSIS — I2510 Atherosclerotic heart disease of native coronary artery without angina pectoris: Secondary | ICD-10-CM | POA: Insufficient documentation

## 2023-10-13 DIAGNOSIS — Z923 Personal history of irradiation: Secondary | ICD-10-CM | POA: Insufficient documentation

## 2023-10-13 DIAGNOSIS — C61 Malignant neoplasm of prostate: Secondary | ICD-10-CM | POA: Diagnosis not present

## 2023-10-13 DIAGNOSIS — N200 Calculus of kidney: Secondary | ICD-10-CM | POA: Insufficient documentation

## 2023-10-13 DIAGNOSIS — C7801 Secondary malignant neoplasm of right lung: Secondary | ICD-10-CM | POA: Diagnosis not present

## 2023-10-13 DIAGNOSIS — Z7901 Long term (current) use of anticoagulants: Secondary | ICD-10-CM | POA: Diagnosis not present

## 2023-10-13 DIAGNOSIS — I700 Atherosclerosis of aorta: Secondary | ICD-10-CM | POA: Insufficient documentation

## 2023-10-13 DIAGNOSIS — Z85820 Personal history of malignant melanoma of skin: Secondary | ICD-10-CM | POA: Diagnosis not present

## 2023-10-13 DIAGNOSIS — Z86718 Personal history of other venous thrombosis and embolism: Secondary | ICD-10-CM | POA: Diagnosis not present

## 2023-10-13 DIAGNOSIS — K219 Gastro-esophageal reflux disease without esophagitis: Secondary | ICD-10-CM | POA: Insufficient documentation

## 2023-10-13 DIAGNOSIS — Z79899 Other long term (current) drug therapy: Secondary | ICD-10-CM | POA: Insufficient documentation

## 2023-10-13 DIAGNOSIS — I10 Essential (primary) hypertension: Secondary | ICD-10-CM | POA: Diagnosis not present

## 2023-10-15 NOTE — Progress Notes (Signed)
  Radiation Oncology         [phone] ________________________________  Name: Richard Delacruz MRN: [id number]  Date: 10/16/2023  DOB: 07-09-1951  STEREOTACTIC BODY RADIOTHERAPY SIMULATION AND TREATMENT PLANNING NOTE    ICD-10-CM   1. Recurrent prostate adenocarcinoma (HCC)  C61       DIAGNOSIS:  73 y.o. gentleman with locally recurrent metastatic prostate cancer with Gleason score of 4+4, and PSA of 13.67, s/p EBRT 09/2010.   NARRATIVE:  The patient was brought to the CT Simulation planning suite.  Identity was confirmed.  All relevant records and images related to the planned course of therapy were reviewed.  The patient freely provided informed written consent to proceed with treatment after reviewing the details related to the planned course of therapy. The consent form was witnessed and verified by the simulation staff.  Then, the patient was set-up in a stable reproducible  supine position for radiation therapy.  A BodyFix immobilization pillow was fabricated for reproducible positioning.  Surface markings were placed.  The CT images were loaded into the planning software.  The gross target volumes (GTV) and planning target volumes (PTV) were delinieated, and avoidance structures were contoured.  Treatment planning then occurred.  The radiation prescription was entered and confirmed.  A total of two complex treatment devices were fabricated in the form of the BodyFix immobilization pillow and a neck accuform cushion.  I have requested : 3D Simulation  I have requested a DVH of the following structures: targets and all normal structures near the target including rectum, bladder, femoral heads and skin as noted on the radiation plan to maintain doses in adherence with established limits  SPECIAL TREATMENT PROCEDURE:  The planned course of therapy using radiation constitutes a special treatment procedure. Special care is required in the management of this patient for the following reasons.  High dose per fraction requiring special monitoring for increased toxicities of treatment including daily imaging..  The special nature of the planned course of radiotherapy will require increased physician supervision and oversight to ensure patient's safety with optimal treatment outcomes.    This requires extended time and effort.    PLAN:  The patient will receive 36.25 Gy in 5 fractions to the locally recurrent disease in the prostate.  ________________________________  Donnice LABOR. Patrcia, M.D.

## 2023-10-16 ENCOUNTER — Ambulatory Visit
Admission: RE | Admit: 2023-10-16 | Discharge: 2023-10-16 | Disposition: A | Discharge status: Home / Self Care | Payer: PPO | Source: Ambulatory Visit | Attending: Radiation Oncology | Admitting: Radiation Oncology

## 2023-10-16 DIAGNOSIS — Z510 Encounter for antineoplastic radiation therapy: Secondary | ICD-10-CM | POA: Insufficient documentation

## 2023-10-16 DIAGNOSIS — C61 Malignant neoplasm of prostate: Secondary | ICD-10-CM | POA: Insufficient documentation

## 2023-10-16 NOTE — Progress Notes (Signed)
 Introduced myself to the patient as the prostate nurse navigator.  No barriers to care identified at this time.  He is here to discuss his radiation treatment options.  I gave him my business card and asked him to call me with questions or concerns.  Verbalized understanding.  ?

## 2023-10-17 IMAGING — DX DG CHEST 2V
2 series · 2 of 2 positions shown · non-contrast
Comparison: 09/28/2019 chest CT from [HOSPITAL].

CLINICAL DATA: Left-sided chest pain and shortness of breath

EXAM:
CHEST - 2 VIEW

[chest pa]
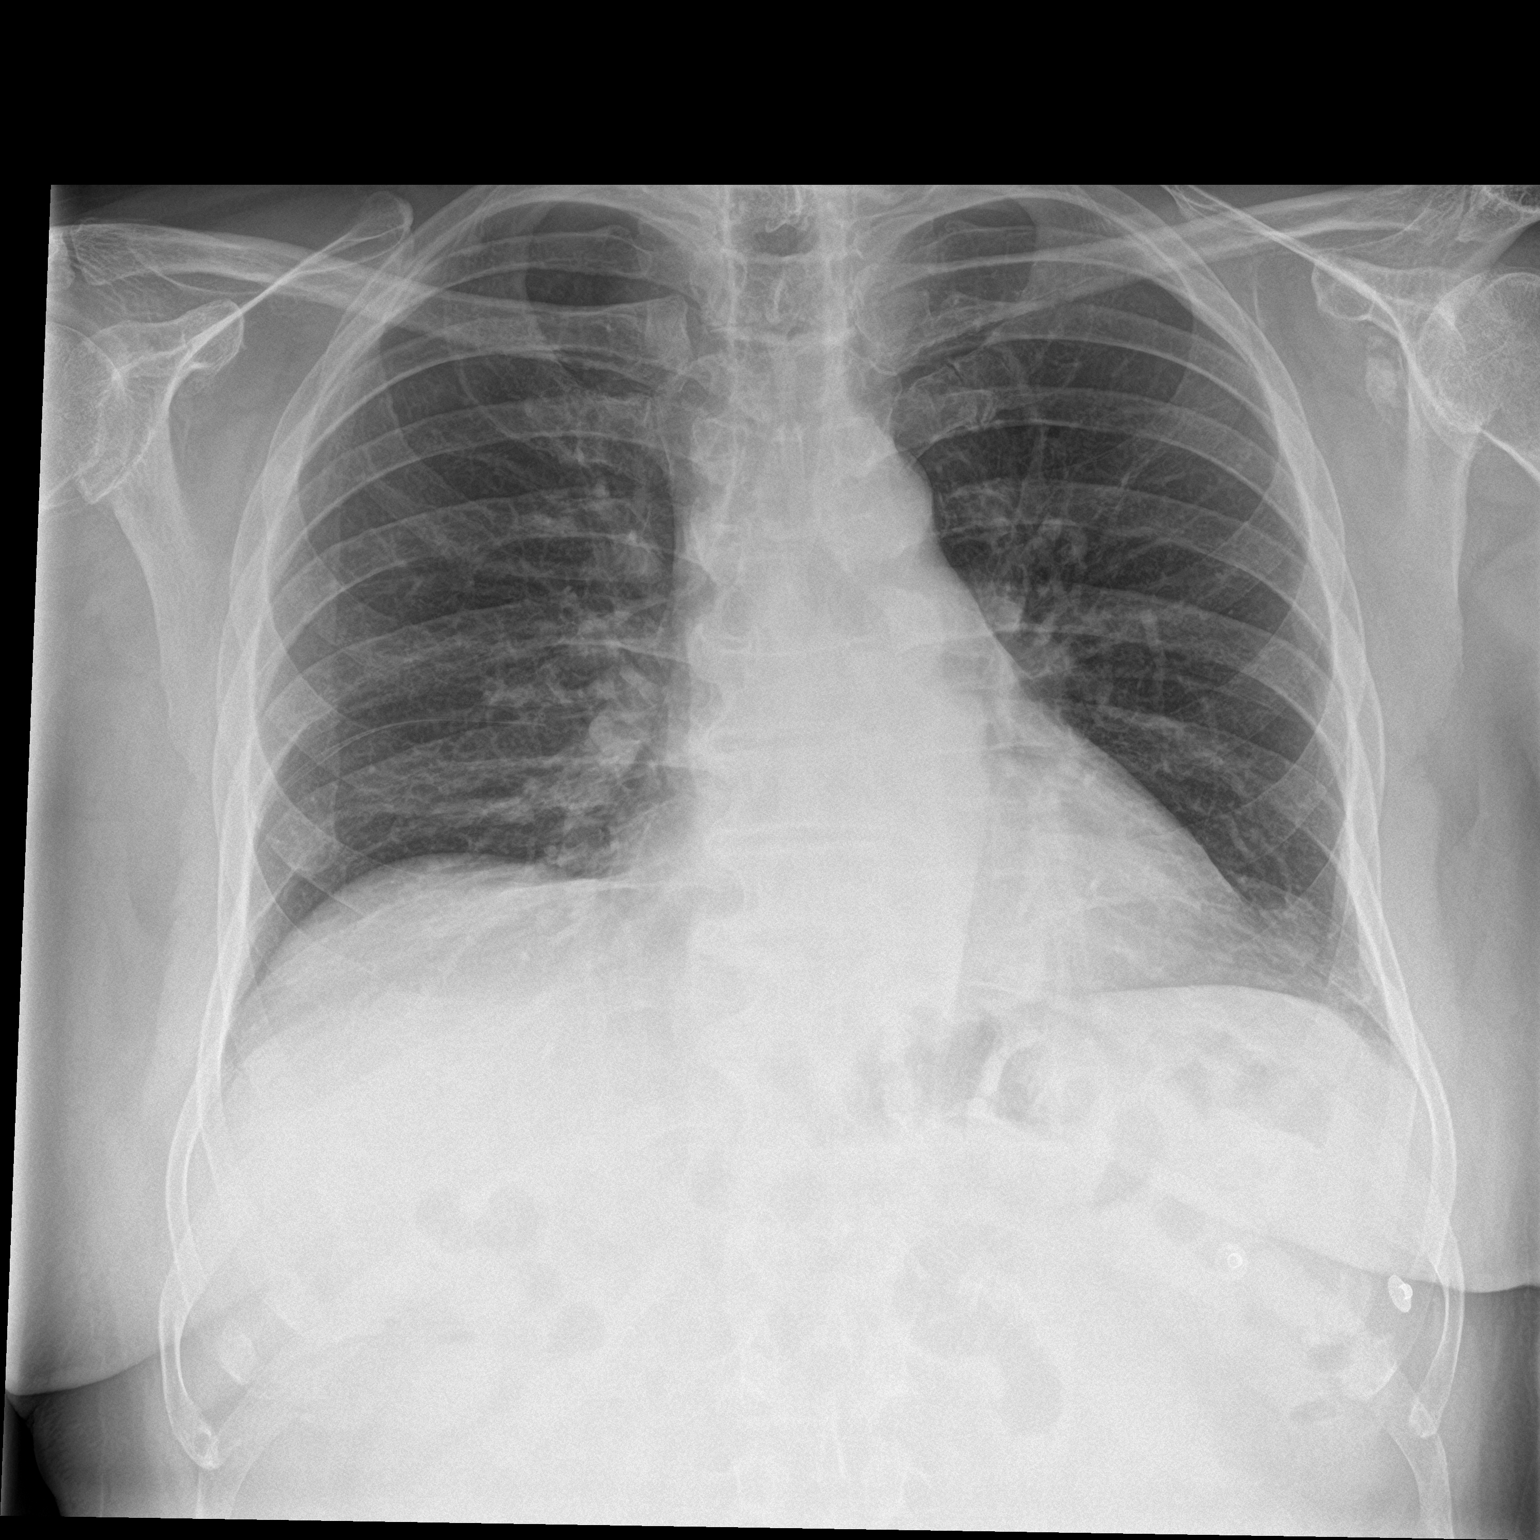

[chest lat]
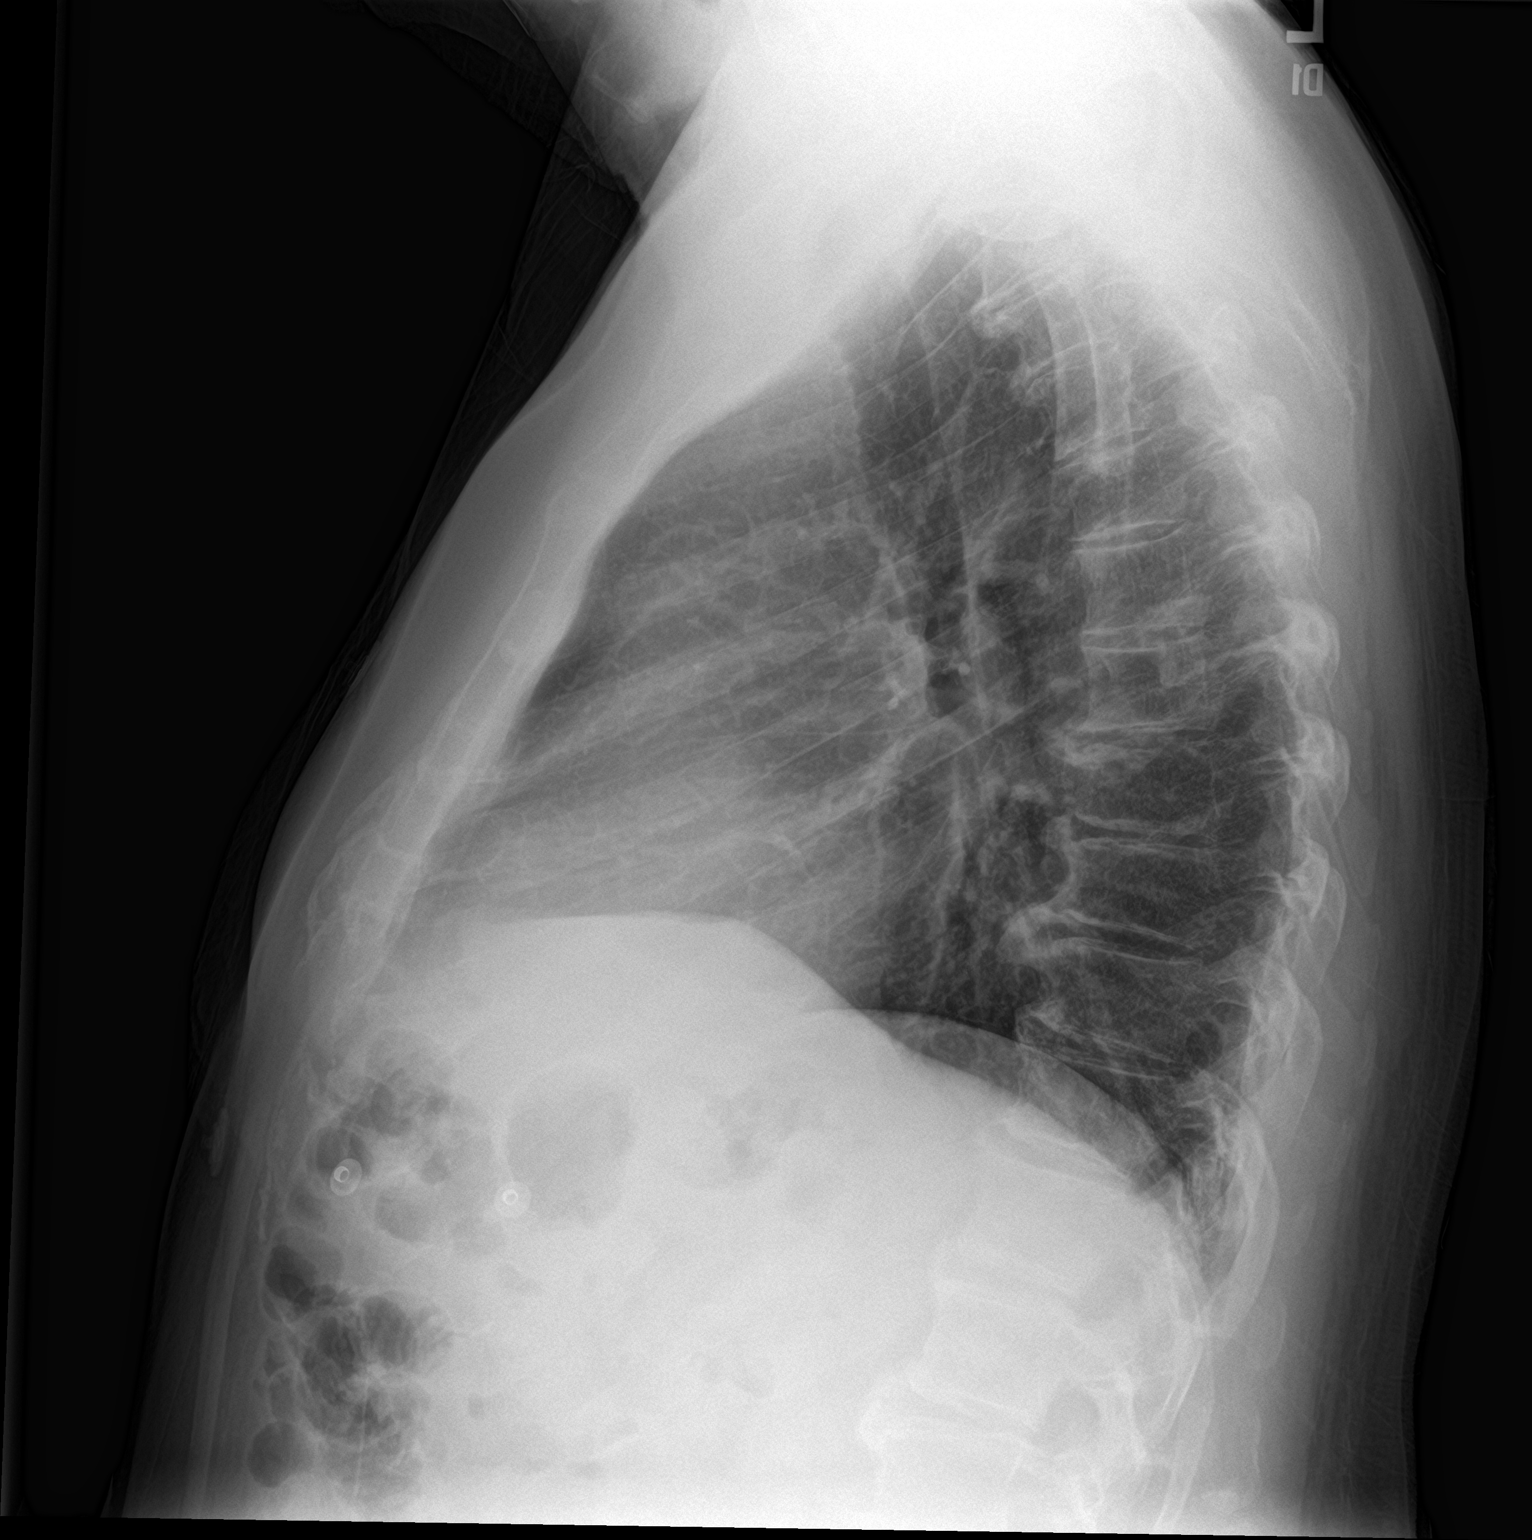

[2 of 2 positions shown; findings below may reference images not displayed]

FINDINGS: Midline trachea. Normal heart size and mediastinal contours. No
pleural effusion or pneumothorax. Mild right hemidiaphragm
elevation. Mild volume loss at both lung bases.
IMPRESSION: No acute cardiopulmonary disease.

## 2023-10-19 DIAGNOSIS — Z510 Encounter for antineoplastic radiation therapy: Secondary | ICD-10-CM | POA: Diagnosis not present

## 2023-10-28 ENCOUNTER — Other Ambulatory Visit: Payer: Self-pay

## 2023-10-28 ENCOUNTER — Ambulatory Visit
Admission: RE | Admit: 2023-10-28 | Discharge: 2023-10-28 | Disposition: A | Discharge status: Home / Self Care | Payer: PPO | Source: Ambulatory Visit | Attending: Radiation Oncology | Admitting: Radiation Oncology

## 2023-10-28 DIAGNOSIS — Z510 Encounter for antineoplastic radiation therapy: Secondary | ICD-10-CM | POA: Diagnosis not present

## 2023-10-28 DIAGNOSIS — C61 Malignant neoplasm of prostate: Secondary | ICD-10-CM

## 2023-10-28 LAB — RAD ONC ARIA SESSION SUMMARY
Course Elapsed Days: 0
Plan Fractions Treated to Date: 1
Plan Prescribed Dose Per Fraction: 7.25 Gy
Plan Total Fractions Prescribed: 5
Plan Total Prescribed Dose: 36.25 Gy
Reference Point Dosage Given to Date: 7.25 Gy
Reference Point Session Dosage Given: 7.25 Gy
Session Number: 1

## 2023-10-29 ENCOUNTER — Ambulatory Visit: Payer: PPO | Admitting: Radiation Oncology

## 2023-10-30 ENCOUNTER — Ambulatory Visit
Admission: RE | Admit: 2023-10-30 | Discharge: 2023-10-30 | Disposition: A | Discharge status: Home / Self Care | Payer: PPO | Source: Ambulatory Visit | Attending: Radiation Oncology | Admitting: Radiation Oncology

## 2023-10-30 ENCOUNTER — Other Ambulatory Visit: Payer: Self-pay

## 2023-10-30 DIAGNOSIS — C61 Malignant neoplasm of prostate: Secondary | ICD-10-CM

## 2023-10-30 DIAGNOSIS — Z510 Encounter for antineoplastic radiation therapy: Secondary | ICD-10-CM | POA: Diagnosis not present

## 2023-10-30 LAB — RAD ONC ARIA SESSION SUMMARY
Course Elapsed Days: 2
Plan Fractions Treated to Date: 2
Plan Prescribed Dose Per Fraction: 7.25 Gy
Plan Total Fractions Prescribed: 5
Plan Total Prescribed Dose: 36.25 Gy
Reference Point Dosage Given to Date: 14.5 Gy
Reference Point Session Dosage Given: 7.25 Gy
Session Number: 2

## 2023-11-02 ENCOUNTER — Other Ambulatory Visit: Payer: Self-pay

## 2023-11-02 ENCOUNTER — Ambulatory Visit
Admission: RE | Admit: 2023-11-02 | Discharge: 2023-11-02 | Disposition: A | Discharge status: Home / Self Care | Payer: PPO | Source: Ambulatory Visit | Attending: Radiation Oncology | Admitting: Radiation Oncology

## 2023-11-02 DIAGNOSIS — Z510 Encounter for antineoplastic radiation therapy: Secondary | ICD-10-CM | POA: Diagnosis not present

## 2023-11-02 DIAGNOSIS — C61 Malignant neoplasm of prostate: Secondary | ICD-10-CM

## 2023-11-02 LAB — RAD ONC ARIA SESSION SUMMARY
Course Elapsed Days: 5
Plan Fractions Treated to Date: 3
Plan Prescribed Dose Per Fraction: 7.25 Gy
Plan Total Fractions Prescribed: 5
Plan Total Prescribed Dose: 36.25 Gy
Reference Point Dosage Given to Date: 21.75 Gy
Reference Point Session Dosage Given: 7.25 Gy
Session Number: 3

## 2023-11-03 ENCOUNTER — Ambulatory Visit: Payer: PPO | Admitting: Radiation Oncology

## 2023-11-04 ENCOUNTER — Ambulatory Visit
Admission: RE | Admit: 2023-11-04 | Discharge: 2023-11-04 | Disposition: A | Discharge status: Home / Self Care | Payer: PPO | Source: Ambulatory Visit | Attending: Radiation Oncology | Admitting: Radiation Oncology

## 2023-11-04 ENCOUNTER — Other Ambulatory Visit: Payer: Self-pay

## 2023-11-04 DIAGNOSIS — C61 Malignant neoplasm of prostate: Secondary | ICD-10-CM

## 2023-11-04 DIAGNOSIS — Z510 Encounter for antineoplastic radiation therapy: Secondary | ICD-10-CM | POA: Diagnosis not present

## 2023-11-04 LAB — RAD ONC ARIA SESSION SUMMARY
Course Elapsed Days: 7
Plan Fractions Treated to Date: 4
Plan Prescribed Dose Per Fraction: 7.25 Gy
Plan Total Fractions Prescribed: 5
Plan Total Prescribed Dose: 36.25 Gy
Reference Point Dosage Given to Date: 29 Gy
Reference Point Session Dosage Given: 7.25 Gy
Session Number: 4

## 2023-11-06 ENCOUNTER — Ambulatory Visit
Admission: RE | Admit: 2023-11-06 | Discharge: 2023-11-06 | Disposition: A | Discharge status: Home / Self Care | Payer: PPO | Source: Ambulatory Visit | Attending: Radiation Oncology | Admitting: Radiation Oncology

## 2023-11-06 ENCOUNTER — Other Ambulatory Visit: Payer: Self-pay

## 2023-11-06 DIAGNOSIS — Z510 Encounter for antineoplastic radiation therapy: Secondary | ICD-10-CM | POA: Diagnosis not present

## 2023-11-06 DIAGNOSIS — C61 Malignant neoplasm of prostate: Secondary | ICD-10-CM

## 2023-11-06 LAB — RAD ONC ARIA SESSION SUMMARY
Course Elapsed Days: 9
Plan Fractions Treated to Date: 5
Plan Prescribed Dose Per Fraction: 7.25 Gy
Plan Total Fractions Prescribed: 5
Plan Total Prescribed Dose: 36.25 Gy
Reference Point Dosage Given to Date: 36.25 Gy
Reference Point Session Dosage Given: 7.25 Gy
Session Number: 5

## 2023-11-09 NOTE — Radiation Completion Notes (Addendum)
  Radiation Oncology         [phone] ________________________________  Name: Richard Delacruz MRN: [id number]  Date: 11/06/2023  DOB: 1951-03-13  Referring Physician: Berniece Salines, M.D. Date of Service: 2023-11-09 Radiation Oncologist: Margaretmary Bayley, M.D. [facility]     RADIATION ONCOLOGY END OF TREATMENT NOTE     Diagnosis: 73 y.o. gentleman with locally recurrent metastatic prostate cancer with Gleason score of 4+4, and PSA of 13.67, s/p EBRT 09/2010.   Intent: Curative     ==========DELIVERED PLANS==========  First Treatment Date: 2023-10-28 Last Treatment Date: 2023-11-06   Plan Name: Prostate_SBRT Site: Prostate Technique: SBRT/SRT-IMRT Mode: Photon Dose Per Fraction: 7.25 Gy Prescribed Dose (Delivered / Prescribed): 36.25 Gy / 36.25 Gy Prescribed Fxs (Delivered / Prescribed): 5 / 5     ==========ON TREATMENT VISIT DATES========== 2023-10-28, 2023-10-30, 2023-11-02, 2023-11-04, 2023-11-06, 2023-11-06   See weekly On Treatment Notes in Epic for details in the Media tab (listed as Progress notes on the On Treatment Visit Dates listed above).  He tolerated the radiation treatments relatively well with only modest fatigue.  The patient will receive a call in about one month from the radiation oncology department. He will continue follow up with his urologist, Dr. Marlou Porch, as well.  ------------------------------------------------   Margaretmary Dys, MD [facility]  Radiation Oncology Direct Dial: [phone]  Fax: [phone] [email or web address]  Skype  LinkedIn

## 2023-12-08 ENCOUNTER — Ambulatory Visit
Admission: RE | Admit: 2023-12-08 | Discharge: 2023-12-08 | Disposition: A | Discharge status: Home / Self Care | Payer: PPO | Source: Ambulatory Visit | Attending: Radiation Oncology | Admitting: Radiation Oncology

## 2023-12-08 NOTE — Progress Notes (Signed)
  Radiation Oncology         [phone] ________________________________  Name: Richard Delacruz MRN: [id number]  Date of Service: 12/08/2023  DOB: 1951/10/03  Post Treatment Telephone Note  Diagnosis:  C61 Malignant neoplasm of prostate (as documented in provider EOT note)  Pre Treatment IPSS Score: 14 (as documented in the provider consult note)  The patient was available for call today.   Symptoms of fatigue have improved since completing therapy.  Symptoms of bladder changes have improved since completing therapy. Current symptoms include none, and medications for bladder symptoms include none.  Symptoms of bowel changes have improved since completing therapy. Current symptoms include none, and medications for bowel symptoms include none.   Post Treatment IPSS Score: IPSS Questionnaire (AUA-7): Over the past month.   1)  How often have you had a sensation of not emptying your bladder completely after you finish urinating?  3 - About half the time  2)  How often have you had to urinate again less than two hours after you finished urinating? 2 - Less than half the time  3)  How often have you found you stopped and started again several times when you urinated?  3 - About half the time  4) How difficult have you found it to postpone urination?  1 - Less than 1 time in 5  5) How often have you had a weak urinary stream?  3 - About half the time  6) How often have you had to push or strain to begin urination?  4 - More than half the time  7) How many times did you most typically get up to urinate from the time you went to bed until the time you got up in the morning?  0 - None  Total score:  16. Which indicates moderate symptoms  0-7 mildly symptomatic   8-19 moderately symptomatic   20-35 severely symptomatic    Patient has a scheduled follow up visit with his urologist, Dr. Marlou Porch, on 4/25 for ongoing surveillance. He was counseled that PSA levels will be drawn in the  urology office, and was reassured that additional time is expected to improve bowel and bladder symptoms. He was encouraged to call back with concerns or questions regarding radiation.   This concludes the interaction.  Ruel Favors, LPN

## 2023-12-09 ENCOUNTER — Inpatient Hospital Stay: Payer: PPO | Attending: Internal Medicine

## 2023-12-09 ENCOUNTER — Ambulatory Visit (HOSPITAL_COMMUNITY)
Admission: RE | Admit: 2023-12-09 | Discharge: 2023-12-09 | Disposition: A | Discharge status: Home / Self Care | Payer: PPO | Source: Ambulatory Visit | Attending: Internal Medicine | Admitting: Internal Medicine

## 2023-12-09 DIAGNOSIS — C3490 Malignant neoplasm of unspecified part of unspecified bronchus or lung: Secondary | ICD-10-CM | POA: Insufficient documentation

## 2023-12-09 DIAGNOSIS — Z79899 Other long term (current) drug therapy: Secondary | ICD-10-CM | POA: Insufficient documentation

## 2023-12-09 DIAGNOSIS — C7801 Secondary malignant neoplasm of right lung: Secondary | ICD-10-CM | POA: Diagnosis not present

## 2023-12-09 DIAGNOSIS — I700 Atherosclerosis of aorta: Secondary | ICD-10-CM | POA: Diagnosis not present

## 2023-12-09 DIAGNOSIS — D696 Thrombocytopenia, unspecified: Secondary | ICD-10-CM | POA: Insufficient documentation

## 2023-12-09 DIAGNOSIS — C61 Malignant neoplasm of prostate: Secondary | ICD-10-CM | POA: Insufficient documentation

## 2023-12-09 LAB — CBC WITH DIFFERENTIAL (CANCER CENTER ONLY)
Abs Immature Granulocytes: 0.02 10*3/uL (ref 0.00–0.07)
Basophils Absolute: 0 10*3/uL (ref 0.0–0.1)
Basophils Relative: 1 %
Eosinophils Absolute: 0.1 10*3/uL (ref 0.0–0.5)
Eosinophils Relative: 1 %
HCT: 47 % (ref 39.0–52.0)
Hemoglobin: 15.4 g/dL (ref 13.0–17.0)
Immature Granulocytes: 0 %
Lymphocytes Relative: 20 %
Lymphs Abs: 1 10*3/uL (ref 0.7–4.0)
MCH: 32 pg (ref 26.0–34.0)
MCHC: 32.8 g/dL (ref 30.0–36.0)
MCV: 97.5 fL (ref 80.0–100.0)
Monocytes Absolute: 0.5 10*3/uL (ref 0.1–1.0)
Monocytes Relative: 9 %
Neutro Abs: 3.6 10*3/uL (ref 1.7–7.7)
Neutrophils Relative %: 69 %
Platelet Count: 145 10*3/uL — ABNORMAL LOW (ref 150–400)
RBC: 4.82 MIL/uL (ref 4.22–5.81)
RDW: 12.5 % (ref 11.5–15.5)
WBC Count: 5.2 10*3/uL (ref 4.0–10.5)
nRBC: 0 % (ref 0.0–0.2)

## 2023-12-09 LAB — CMP (CANCER CENTER ONLY)
ALT: 28 U/L (ref 0–44)
AST: 25 U/L (ref 15–41)
Albumin: 4.7 g/dL (ref 3.5–5.0)
Alkaline Phosphatase: 72 U/L (ref 38–126)
Anion gap: 6 (ref 5–15)
BUN: 19 mg/dL (ref 8–23)
CO2: 31 mmol/L (ref 22–32)
Calcium: 9.8 mg/dL (ref 8.9–10.3)
Chloride: 103 mmol/L (ref 98–111)
Creatinine: 1.11 mg/dL (ref 0.61–1.24)
GFR, Estimated: >60 mL/min (ref >60)
Glucose, Bld: 96 mg/dL (ref 70–99)
Potassium: 4.4 mmol/L (ref 3.5–5.1)
Sodium: 140 mmol/L (ref 135–145)
Total Bilirubin: 0.5 mg/dL (ref 0.0–1.2)
Total Protein: 7.5 g/dL (ref 6.5–8.1)

## 2023-12-09 MED ORDER — IOHEXOL 300 MG/ML  SOLN
75.0000 mL | Freq: Once | INTRAMUSCULAR | Status: AC | PRN
  Administered 2023-12-09: 75 mL via INTRAVENOUS

## 2023-12-17 ENCOUNTER — Inpatient Hospital Stay: Payer: PPO | Admitting: Internal Medicine

## 2023-12-17 VITALS — BP 148/93 | HR 53 | Temp 98.2°F | Resp 17 | Ht 69.5 in | Wt 180.0 lb

## 2023-12-17 DIAGNOSIS — C61 Malignant neoplasm of prostate: Secondary | ICD-10-CM | POA: Diagnosis not present

## 2023-12-17 DIAGNOSIS — R911 Solitary pulmonary nodule: Secondary | ICD-10-CM

## 2023-12-17 NOTE — Progress Notes (Signed)
 [facility] [phone]   [phone]  OFFICE PROGRESS NOTE  Richard Hazel, MD [address] [place] [address]  DIAGNOSIS:   1) synchronous adenocarcinoma of the right upper lobe as well as 2 nodules involving the lingula and left upper lobe diagnosed in April 2024.  Upon further testing and immunohistochemical stains, the final cytology was consistent with metastatic prostate adenocarcinoma rather than non-small cell lung cancer. 2) prostate adenocarcinoma with Gleason score of 6 (3+3) and Gleason score of 7 (4+3) diagnosed in November 2024 and managed by Dr. Marlou Porch.  PRIOR THERAPY:  1) SBRT to the 3 pulmonary nodules under the care of Dr. Roselind Messier 2) status post curative radiotherapy to the prostate gland under the care of of Dr. Kathrynn Running.  CURRENT THERAPY: Observation  INTERVAL HISTORY: Richard Delacruz 73 y.o. male returns to the clinic today for follow-up visit.Discussed the use of AI scribe software for clinical note transcription with the patient, who gave verbal consent to proceed.  History of Present Illness   The patient is a 73 year old with pulmonary adenocarcinoma who presents for follow-up.  He has a history of synchronous lung nodules, adenocarcinoma diagnosed in April 2024, with stage 1A in the right upper lobe and stage 2B with two nodules in the left upper lobe. He received radiation treatment to all three nodules. Follow-up scans have been conducted approximately every six months. He feels fine with no new complaints since the last visit. No chest pain, shortness of breath, cough, hemoptysis, nausea, vomiting, or diarrhea. He experienced fatigue for about two to three weeks but denies any associated flu-like symptoms.  A reexamination of his biopsy indicated that the lung cancer was caused by metastasis from the prostate, not due to secondhand smoke or asbestos exposure. He recently completed radiation treatment for his  prostate.  Recent imaging showed some tiny nodules, with some improvement and one slightly more prominent than before.  He notes an abnormal platelet count of 145, which he attributes to radiation treatment.  He is currently taking Neurontin at a dose of 300 mg daily.       MEDICAL HISTORY: Past Medical History:  Diagnosis Date   Anxiety    Arthritis    Cancer (HCC)    Prostate and melanoma   Depression    DVT (deep venous thrombosis) (HCC)    right lower leg   GERD (gastroesophageal reflux disease)    History of radiation therapy    Right lung- 03/30/23-04/10/23- Dr. Antony Blackbird   Hypertension     ALLERGIES:  is allergic to atacand [candesartan], amlodipine, mirtazapine, monopril [fosinopril], naproxen, trazodone, and niaspan [niacin er (antihyperlipidemic)].  MEDICATIONS:  Current Outpatient Medications  Medication Sig Dispense Refill   acetaminophen (TYLENOL) 500 MG tablet Take 1,000 mg by mouth every 6 (six) hours as needed for moderate pain.     atorvastatin (LIPITOR) 40 MG tablet Take 40 mg by mouth daily.     buPROPion (WELLBUTRIN SR) 150 MG 12 hr tablet Take 150 mg by mouth 3 (three) times daily.     Calcium Citrate-Vitamin D (CALCIUM + D PO) Take 2 tablets by mouth daily.     clonazePAM (KLONOPIN) 1 MG tablet Take 1 mg by mouth 2 (two) times daily.      COMIRNATY syringe      diphenhydramine-acetaminophen (TYLENOL PM) 25-500 MG TABS tablet Take 2 tablets by mouth at bedtime.     FLUZONE HIGH-DOSE 0.5 ML injection  gabapentin (NEURONTIN) 100 MG capsule Take 100-300 mg by mouth at bedtime.     lamoTRIgine (LAMICTAL) 100 MG tablet Take 100 mg by mouth 2 (two) times daily.     metoprolol succinate (TOPROL-XL) 100 MG 24 hr tablet Take 100 mg by mouth daily. Take with or immediately following a meal.     Multiple Vitamin (MULTIVITAMIN WITH MINERALS) TABS tablet Take 1 tablet by mouth daily.     rivaroxaban (XARELTO) 10 MG TABS tablet Take 1 tablet (10 mg total) by  mouth daily. 30 tablet 0   No current facility-administered medications for this visit.    SURGICAL HISTORY:  Past Surgical History:  Procedure Laterality Date   ADENOIDECTOMY     BRONCHIAL BIOPSY  01/26/2023   Procedure: BRONCHIAL BIOPSIES;  Surgeon: Leslye Peer, MD;  Location: [facility] ENDOSCOPY;  Service: Pulmonary;;   BRONCHIAL BRUSHINGS  01/26/2023   Procedure: BRONCHIAL BRUSHINGS;  Surgeon: Leslye Peer, MD;  Location: [facility] ENDOSCOPY;  Service: Pulmonary;;   BRONCHIAL NEEDLE ASPIRATION BIOPSY  01/26/2023   Procedure: BRONCHIAL NEEDLE ASPIRATION BIOPSIES;  Surgeon: Leslye Peer, MD;  Location: [facility] ENDOSCOPY;  Service: Pulmonary;;   COLONOSCOPY     CYSTOSCOPY/URETEROSCOPY/HOLMIUM LASER/STENT PLACEMENT Left 03/23/2023   Procedure: CYSTOSCOPY/URETEROSCOPY/HOLMIUM LASER/STENT PLACEMENT;  Surgeon: Bjorn Pippin, MD;  Location: [facility] ORS;  Service: Urology;  Laterality: Left;   TONSILLECTOMY     VIDEO BRONCHOSCOPY WITH RADIAL ENDOBRONCHIAL ULTRASOUND  01/26/2023   Procedure: VIDEO BRONCHOSCOPY WITH RADIAL ENDOBRONCHIAL ULTRASOUND;  Surgeon: Leslye Peer, MD;  Location: [facility] ENDOSCOPY;  Service: Pulmonary;;    REVIEW OF SYSTEMS:  Constitutional: positive for fatigue Eyes: negative Ears, nose, mouth, throat, and face: negative Respiratory: negative Cardiovascular: negative Gastrointestinal: negative Genitourinary:negative Integument/breast: negative Hematologic/lymphatic: negative Musculoskeletal:negative Neurological: negative Behavioral/Psych: negative Endocrine: negative Allergic/Immunologic: negative   PHYSICAL EXAMINATION: General appearance: alert, cooperative, fatigued, and no distress Head: Normocephalic, without obvious abnormality, atraumatic Neck: no adenopathy, no carotid bruit, supple, symmetrical, trachea midline, and thyroid not enlarged, symmetric, no tenderness/mass/nodules Lymph nodes: Cervical, supraclavicular, and axillary nodes normal. Resp: clear to auscultation  bilaterally Back: symmetric, no curvature. ROM normal. No CVA tenderness. Cardio: regular rate and rhythm, S1, S2 normal, no murmur, click, rub or gallop GI: soft, non-tender; bowel sounds normal; no masses,  no organomegaly Extremities: extremities normal, atraumatic, no cyanosis or edema Neurologic: Alert and oriented X 3, normal strength and tone. Normal symmetric reflexes. Normal coordination and gait  ECOG PERFORMANCE STATUS: 1 - Symptomatic but completely ambulatory  Blood pressure (!) 148/93, pulse (!) 53, temperature 98.2 F (36.8 C), temperature source Temporal, resp. rate 17, height 5' 9.5" (1.765 m), weight 180 lb (81.6 kg), SpO2 100%.  LABORATORY DATA: Lab Results  Component Value Date   WBC 5.2 12/09/2023   HGB 15.4 12/09/2023   HCT 47.0 12/09/2023   MCV 97.5 12/09/2023   PLT 145 (L) 12/09/2023      Chemistry      Component Value Date/Time   NA 140 12/09/2023 0828   K 4.4 12/09/2023 0828   CL 103 12/09/2023 0828   CO2 31 12/09/2023 0828   BUN 19 12/09/2023 0828   CREATININE 1.11 12/09/2023 0828      Component Value Date/Time   CALCIUM 9.8 12/09/2023 0828   ALKPHOS 72 12/09/2023 0828   AST 25 12/09/2023 0828   ALT 28 12/09/2023 0828   BILITOT 0.5 12/09/2023 0828       RADIOGRAPHIC STUDIES: CT Chest W Contrast Result Date: 12/17/2023 CLINICAL DATA:  Non-small-cell lung  cancer. Staging. Prostate cancer. * Tracking Code: BO * EXAM: CT CHEST WITH CONTRAST TECHNIQUE: Multidetector CT imaging of the chest was performed during intravenous contrast administration. RADIATION DOSE REDUCTION: This exam was performed according to the departmental dose-optimization program which includes automated exposure control, adjustment of the mA and/or kV according to patient size and/or use of iterative reconstruction technique. CONTRAST:  75mL OMNIPAQUE IOHEXOL 300 MG/ML  SOLN COMPARISON:  Head CT 09/24/2023. chest CT 06/16/2023. FINDINGS: Cardiovascular: Heart is nonenlarged. No  pericardial effusion. Coronary artery calcifications are seen. The thoracic aorta is normal course and caliber with mild atherosclerotic disease. This is partially calcified. Mediastinum/Nodes: No specific abnormal lymph node enlargement identified in the axillary region, hilum or mediastinum. Preserved thyroid gland. Normal caliber thoracic esophagus. Lungs/Pleura: On the prior PET-CT there are areas of ill-defined parenchymal opacities in the medial left upper lobe and towards the lingula in the left lung and medial right upper lobe on the right. These areas are relatively similar in extent distribution on today's examination. There is some components of nodularity in these locations which appears similar as well. Recommend continued follow-up. There are some residual areas of nodularity seen in the lungs. Previous lesion measured 9 x 3 mm in this location and today when measured in same fashion area measures 11 by 6 mm on series 8, image 19, slightly larger. Focus just posteroinferior to this appears measuring 5 mm, today is stable on series 8, image 106. There are small nodules are similar. There are some areas which have improved such as the previous nodule in the left lower lobe in September 2024 on that study series 5, image 96 is no longer present. Small nodular area in the right lung along the major fissure on series 8, image 51 today is stable as well demonstrating some lesions to show stability. Neither example is stable lesion right lower lobe series 8, image 98 measuring 3 mm. No new dominant nodule. Upper Abdomen: Along the upper abdomen the adrenal glands are preserved. Musculoskeletal: Scattered changes of spine with large bridging endplate osteophytes in the thoracic region. IMPRESSION: Stable ill-defined areas of opacity identified in both upper lobes and the lingula. Please correlate with known history. Separate multiple small lung nodules. Going back to study of September 2024 some of these are  stable, some are resolved and there is 1 lesion in left lower lobe inferiorly which is slightly increased. With the Ace lesion recommend short follow-up in 3 months or dedicated standard PET-CT. Aortic Atherosclerosis (ICD10-I70.0). Electronically Signed   By: Karen Kays M.D.   On: 12/17/2023 11:21    ASSESSMENT AND PLAN: This is a very pleasant 73 years old white male with metastatic prostate adenocarcinoma with metastasis to the lung presented with right upper lobe and left upper lobe pulmonary nodules initially thought to represent synchronous lung primary but upon immunohistochemical stains it was consistent with prostate adenocarcinoma diagnosed in April 2024.  He is status post SBRT to the 3 pulmonary nodules under the care of Dr. Roselind Messier The patient was recently diagnosed with prostate adenocarcinoma with Gleason score in the range of 6 (3+3) and 7 (4+3) November 2024 and he completed a course of curative radiotherapy to the prostate at the care of Dr. Kathrynn Running recently. He had repeat CT scan of the chest performed recently.  I personally and independently reviewed the scan and discussed the result with the patient today.    Metastatic prostate cancer to the lungs Richard Delacruz has metastatic adenocarcinoma from the prostate  to the lungs, initially misdiagnosed as primary non-small cell lung cancer. Post-radiation therapy, imaging shows improvement in some nodules, with one slightly prominent. He is asymptomatic. A repeat scan in three months is planned to monitor nodule growth, with potential further radiation if necessary. - Order chest CT scan in three months to monitor lung nodules.  Prostate cancer He completed radiation therapy for prostate cancer. A PSMA PET scan in December or January showed no additional metastatic disease. PSA levels are pending post-radiation assessment by urology. - Schedule PSA test with urology department to assess post-radiation status.  Thrombocytopenia Recent  blood test shows platelet count of 145, slightly below the normal range of 150, likely due to recent radiation therapy. This mild thrombocytopenia is not clinically significant. - Monitor platelet count in future blood tests.  Medication adjustment He is on Neurontin (Gabapentin) 300 mg daily, consistent with his regimen. - Update medication records to reflect Neurontin 300 mg daily.  Follow-up Follow-up is scheduled in three months to reassess lung nodules and overall health. - Schedule follow-up appointment in three months. - Perform lab tests and imaging as planned for the next visit.   He was advised to call immediately if he has any concerning symptoms in the interval.  The patient voices understanding of current disease status and treatment options and is in agreement with the current care plan.  All questions were answered. The patient knows to call the clinic with any problems, questions or concerns. We can certainly see the patient much sooner if necessary.  The total time spent in the appointment was 30 minutes.  Disclaimer: This note was dictated with voice recognition software. Similar sounding words can inadvertently be transcribed and may not be corrected upon review.

## 2024-01-05 DIAGNOSIS — E7800 Pure hypercholesterolemia, unspecified: Secondary | ICD-10-CM | POA: Diagnosis not present

## 2024-01-05 DIAGNOSIS — M79671 Pain in right foot: Secondary | ICD-10-CM | POA: Diagnosis not present

## 2024-01-05 DIAGNOSIS — Z23 Encounter for immunization: Secondary | ICD-10-CM | POA: Diagnosis not present

## 2024-01-05 DIAGNOSIS — Z6826 Body mass index (BMI) 26.0-26.9, adult: Secondary | ICD-10-CM | POA: Diagnosis not present

## 2024-01-05 DIAGNOSIS — C61 Malignant neoplasm of prostate: Secondary | ICD-10-CM | POA: Diagnosis not present

## 2024-01-05 DIAGNOSIS — Z79899 Other long term (current) drug therapy: Secondary | ICD-10-CM | POA: Diagnosis not present

## 2024-01-05 DIAGNOSIS — C7800 Secondary malignant neoplasm of unspecified lung: Secondary | ICD-10-CM | POA: Diagnosis not present

## 2024-01-05 DIAGNOSIS — F319 Bipolar disorder, unspecified: Secondary | ICD-10-CM | POA: Diagnosis not present

## 2024-01-05 DIAGNOSIS — F339 Major depressive disorder, recurrent, unspecified: Secondary | ICD-10-CM | POA: Diagnosis not present

## 2024-01-21 DIAGNOSIS — H40003 Preglaucoma, unspecified, bilateral: Secondary | ICD-10-CM | POA: Diagnosis not present

## 2024-01-21 DIAGNOSIS — H2513 Age-related nuclear cataract, bilateral: Secondary | ICD-10-CM | POA: Diagnosis not present

## 2024-01-25 DIAGNOSIS — M25871 Other specified joint disorders, right ankle and foot: Secondary | ICD-10-CM | POA: Diagnosis not present

## 2024-01-29 NOTE — Progress Notes (Signed)
 Patient had PSA on 4/1 and will have additional PSA on 6/16 at [facility].

## 2024-02-22 DIAGNOSIS — Z08 Encounter for follow-up examination after completed treatment for malignant neoplasm: Secondary | ICD-10-CM | POA: Diagnosis not present

## 2024-02-22 DIAGNOSIS — C44311 Basal cell carcinoma of skin of nose: Secondary | ICD-10-CM | POA: Diagnosis not present

## 2024-02-22 DIAGNOSIS — D225 Melanocytic nevi of trunk: Secondary | ICD-10-CM | POA: Diagnosis not present

## 2024-02-22 DIAGNOSIS — Z85820 Personal history of malignant melanoma of skin: Secondary | ICD-10-CM | POA: Diagnosis not present

## 2024-02-22 DIAGNOSIS — L821 Other seborrheic keratosis: Secondary | ICD-10-CM | POA: Diagnosis not present

## 2024-03-04 DIAGNOSIS — M25871 Other specified joint disorders, right ankle and foot: Secondary | ICD-10-CM | POA: Diagnosis not present

## 2024-03-08 ENCOUNTER — Inpatient Hospital Stay: Attending: Internal Medicine

## 2024-03-08 ENCOUNTER — Ambulatory Visit (HOSPITAL_COMMUNITY)
Admission: RE | Admit: 2024-03-08 | Discharge: 2024-03-08 | Disposition: A | Discharge status: Home / Self Care | Source: Ambulatory Visit | Attending: Internal Medicine | Admitting: Internal Medicine

## 2024-03-08 DIAGNOSIS — C7802 Secondary malignant neoplasm of left lung: Secondary | ICD-10-CM | POA: Insufficient documentation

## 2024-03-08 DIAGNOSIS — C7801 Secondary malignant neoplasm of right lung: Secondary | ICD-10-CM | POA: Insufficient documentation

## 2024-03-08 DIAGNOSIS — R911 Solitary pulmonary nodule: Secondary | ICD-10-CM | POA: Insufficient documentation

## 2024-03-08 DIAGNOSIS — C61 Malignant neoplasm of prostate: Secondary | ICD-10-CM | POA: Diagnosis not present

## 2024-03-08 DIAGNOSIS — C3411 Malignant neoplasm of upper lobe, right bronchus or lung: Secondary | ICD-10-CM | POA: Diagnosis not present

## 2024-03-08 DIAGNOSIS — I700 Atherosclerosis of aorta: Secondary | ICD-10-CM | POA: Diagnosis not present

## 2024-03-08 LAB — CMP (CANCER CENTER ONLY)
ALT: 24 U/L (ref 0–44)
AST: 19 U/L (ref 15–41)
Albumin: 4.9 g/dL (ref 3.5–5.0)
Alkaline Phosphatase: 72 U/L (ref 38–126)
Anion gap: 6 (ref 5–15)
BUN: 22 mg/dL (ref 8–23)
CO2: 31 mmol/L (ref 22–32)
Calcium: 10.1 mg/dL (ref 8.9–10.3)
Chloride: 104 mmol/L (ref 98–111)
Creatinine: 1.08 mg/dL (ref 0.61–1.24)
GFR, Estimated: >60 mL/min (ref >60)
Glucose, Bld: 91 mg/dL (ref 70–99)
Potassium: 4 mmol/L (ref 3.5–5.1)
Sodium: 141 mmol/L (ref 135–145)
Total Bilirubin: 0.5 mg/dL (ref 0.0–1.2)
Total Protein: 8 g/dL (ref 6.5–8.1)

## 2024-03-08 LAB — CBC WITH DIFFERENTIAL (CANCER CENTER ONLY)
Abs Immature Granulocytes: 0.02 10*3/uL (ref 0.00–0.07)
Basophils Absolute: 0 10*3/uL (ref 0.0–0.1)
Basophils Relative: 1 %
Eosinophils Absolute: 0.1 10*3/uL (ref 0.0–0.5)
Eosinophils Relative: 1 %
HCT: 47.1 % (ref 39.0–52.0)
Hemoglobin: 15.6 g/dL (ref 13.0–17.0)
Immature Granulocytes: 0 %
Lymphocytes Relative: 19 %
Lymphs Abs: 1.2 10*3/uL (ref 0.7–4.0)
MCH: 32.1 pg (ref 26.0–34.0)
MCHC: 33.1 g/dL (ref 30.0–36.0)
MCV: 96.9 fL (ref 80.0–100.0)
Monocytes Absolute: 0.6 10*3/uL (ref 0.1–1.0)
Monocytes Relative: 9 %
Neutro Abs: 4.3 10*3/uL (ref 1.7–7.7)
Neutrophils Relative %: 70 %
Platelet Count: 152 10*3/uL (ref 150–400)
RBC: 4.86 MIL/uL (ref 4.22–5.81)
RDW: 13 % (ref 11.5–15.5)
WBC Count: 6.2 10*3/uL (ref 4.0–10.5)
nRBC: 0 % (ref 0.0–0.2)

## 2024-03-08 MED ORDER — SODIUM CHLORIDE (PF) 0.9 % IJ SOLN
INTRAMUSCULAR | Status: AC
  Filled 2024-03-08: qty 50

## 2024-03-08 MED ORDER — IOHEXOL 300 MG/ML  SOLN
75.0000 mL | Freq: Once | INTRAMUSCULAR | Status: AC | PRN
  Administered 2024-03-08: 75 mL via INTRAVENOUS

## 2024-03-09 LAB — PSA, TOTAL AND FREE
PSA, Free Pct: 9.6 %
PSA, Free: 0.65 ng/mL
Prostate Specific Ag, Serum: 6.8 ng/mL — ABNORMAL HIGH (ref 0.0–4.0)

## 2024-03-12 DIAGNOSIS — M79671 Pain in right foot: Secondary | ICD-10-CM | POA: Diagnosis not present

## 2024-03-15 ENCOUNTER — Inpatient Hospital Stay: Admitting: Internal Medicine

## 2024-03-15 VITALS — BP 150/82 | HR 56 | Temp 97.3°F | Resp 16 | Wt 176.7 lb

## 2024-03-15 DIAGNOSIS — C61 Malignant neoplasm of prostate: Secondary | ICD-10-CM

## 2024-03-15 NOTE — Progress Notes (Signed)
 [facility] [phone]   [phone]  OFFICE PROGRESS NOTE  Perley Bradley, MD [address] [place] [address]  DIAGNOSIS:   1) synchronous adenocarcinoma of the right upper lobe as well as 2 nodules involving the lingula and left upper lobe diagnosed in April 2024.  Upon further testing and immunohistochemical stains, the final cytology was consistent with metastatic prostate adenocarcinoma rather than non-small cell lung cancer. 2) prostate adenocarcinoma with Gleason score of 6 (3+3) and Gleason score of 7 (4+3) diagnosed in November 2024 and managed by Dr. Dulcy Gibney.  PRIOR THERAPY:  1) SBRT to the 3 pulmonary nodules under the care of Dr. Eloise Hake 2) status post curative radiotherapy to the prostate gland under the care of of Dr. Lorri Rota.  CURRENT THERAPY: Observation  INTERVAL HISTORY: Richard Delacruz 73 y.o. male returns to the clinic today for follow-up visit.Discussed the use of AI scribe software for clinical note transcription with the patient, who gave verbal consent to proceed.  History of Present Illness   Richard Delacruz is a 73 year old male with metastatic prostate adenocarcinoma who presents for evaluation and repeat CT scan of the chest for restaging of his disease.  He was diagnosed with synchronic adenocarcinoma of the right upper lobe, lingula, and left upper lobe, consistent with metastatic prostate adenocarcinoma, in April 2024. He has undergone stereotactic body radiation therapy (SBRT) to cerebral marrow nodules and curative radiotherapy to the prostate gland.  He feels down after viewing his scan online, noting that a larger nodule did not appear to have been reduced by radiation.  He has a history of high PSA levels and has previously been on hormonal therapy with Lupron Depot, which he describes as 'absolutely horrible' due to side effects such as exhaustion, constant hot flashes, and severe weakness requiring  wheelchair use for the first five to six weeks. He received a six-month dose initially, followed by a one-year dose, which he found miserable.  He mentions that he has not taken gabapentin for quite a while and requests its removal from his medication list. He also notes that he may be having foot surgery soon.       MEDICAL HISTORY: Past Medical History:  Diagnosis Date   Anxiety    Arthritis    Cancer (HCC)    Prostate and melanoma   Depression    DVT (deep venous thrombosis) (HCC)    right lower leg   GERD (gastroesophageal reflux disease)    History of radiation therapy    Right lung- 03/30/23-04/10/23- Dr. Retta Caster   Hypertension     ALLERGIES:  is allergic to atacand [candesartan], amlodipine, mirtazapine, monopril [fosinopril], naproxen, trazodone, and niaspan [niacin er (antihyperlipidemic)].  MEDICATIONS:  Current Outpatient Medications  Medication Sig Dispense Refill   acetaminophen  (TYLENOL ) 500 MG tablet Take 1,000 mg by mouth every 6 (six) hours as needed for moderate pain.     atorvastatin  (LIPITOR) 40 MG tablet Take 40 mg by mouth daily.     buPROPion  (WELLBUTRIN  SR) 150 MG 12 hr tablet Take 150 mg by mouth 3 (three) times daily.     Calcium  Citrate-Vitamin D (CALCIUM  + D PO) Take 2 tablets by mouth daily.     clonazePAM  (KLONOPIN ) 1 MG tablet Take 1 mg by mouth 2 (two) times daily.      COMIRNATY syringe      diphenhydramine -acetaminophen  (TYLENOL  PM) 25-500 MG TABS tablet Take 2 tablets by mouth at bedtime.  FLUZONE HIGH-DOSE 0.5 ML injection      gabapentin (NEURONTIN) 100 MG capsule Take 100-300 mg by mouth at bedtime.     lamoTRIgine  (LAMICTAL ) 100 MG tablet Take 100 mg by mouth 2 (two) times daily.     metoprolol  succinate (TOPROL -XL) 100 MG 24 hr tablet Take 100 mg by mouth daily. Take with or immediately following a meal.     Multiple Vitamin (MULTIVITAMIN WITH MINERALS) TABS tablet Take 1 tablet by mouth daily.     rivaroxaban  (XARELTO ) 10 MG TABS  tablet Take 1 tablet (10 mg total) by mouth daily. 30 tablet 0   No current facility-administered medications for this visit.    SURGICAL HISTORY:  Past Surgical History:  Procedure Laterality Date   ADENOIDECTOMY     BRONCHIAL BIOPSY  01/26/2023   Procedure: BRONCHIAL BIOPSIES;  Surgeon: Denson Flake, MD;  Location: [facility] ENDOSCOPY;  Service: Pulmonary;;   BRONCHIAL BRUSHINGS  01/26/2023   Procedure: BRONCHIAL BRUSHINGS;  Surgeon: Denson Flake, MD;  Location: [facility] ENDOSCOPY;  Service: Pulmonary;;   BRONCHIAL NEEDLE ASPIRATION BIOPSY  01/26/2023   Procedure: BRONCHIAL NEEDLE ASPIRATION BIOPSIES;  Surgeon: Denson Flake, MD;  Location: [facility] ENDOSCOPY;  Service: Pulmonary;;   COLONOSCOPY     CYSTOSCOPY/URETEROSCOPY/HOLMIUM LASER/STENT PLACEMENT Left 03/23/2023   Procedure: CYSTOSCOPY/URETEROSCOPY/HOLMIUM LASER/STENT PLACEMENT;  Surgeon: Homero Luster, MD;  Location: [facility] ORS;  Service: Urology;  Laterality: Left;   TONSILLECTOMY     VIDEO BRONCHOSCOPY WITH RADIAL ENDOBRONCHIAL ULTRASOUND  01/26/2023   Procedure: VIDEO BRONCHOSCOPY WITH RADIAL ENDOBRONCHIAL ULTRASOUND;  Surgeon: Denson Flake, MD;  Location: [facility] ENDOSCOPY;  Service: Pulmonary;;    REVIEW OF SYSTEMS:  Constitutional: positive for fatigue Eyes: negative Ears, nose, mouth, throat, and face: negative Respiratory: negative Cardiovascular: negative Gastrointestinal: negative Genitourinary:negative Integument/breast: negative Hematologic/lymphatic: negative Musculoskeletal:negative Neurological: negative Behavioral/Psych: negative Endocrine: negative Allergic/Immunologic: negative   PHYSICAL EXAMINATION: General appearance: alert, cooperative, fatigued, and no distress Head: Normocephalic, without obvious abnormality, atraumatic Neck: no adenopathy, no carotid bruit, supple, symmetrical, trachea midline, and thyroid  not enlarged, symmetric, no tenderness/mass/nodules Lymph nodes: Cervical, supraclavicular, and axillary nodes  normal. Resp: clear to auscultation bilaterally Back: symmetric, no curvature. ROM normal. No CVA tenderness. Cardio: regular rate and rhythm, S1, S2 normal, no murmur, click, rub or gallop GI: soft, non-tender; bowel sounds normal; no masses,  no organomegaly Extremities: extremities normal, atraumatic, no cyanosis or edema Neurologic: Alert and oriented X 3, normal strength and tone. Normal symmetric reflexes. Normal coordination and gait  ECOG PERFORMANCE STATUS: 1 - Symptomatic but completely ambulatory  Blood pressure (!) 150/82, pulse (!) 56, temperature (!) 97.3 F (36.3 C), temperature source Temporal, resp. rate 16, weight 176 lb 11.2 oz (80.2 kg), SpO2 100%.  LABORATORY DATA: Lab Results  Component Value Date   WBC 6.2 03/08/2024   HGB 15.6 03/08/2024   HCT 47.1 03/08/2024   MCV 96.9 03/08/2024   PLT 152 03/08/2024      Chemistry      Component Value Date/Time   NA 141 03/08/2024 0851   K 4.0 03/08/2024 0851   CL 104 03/08/2024 0851   CO2 31 03/08/2024 0851   BUN 22 03/08/2024 0851   CREATININE 1.08 03/08/2024 0851      Component Value Date/Time   CALCIUM  10.1 03/08/2024 0851   ALKPHOS 72 03/08/2024 0851   AST 19 03/08/2024 0851   ALT 24 03/08/2024 0851   BILITOT 0.5 03/08/2024 0851       RADIOGRAPHIC STUDIES: CT Chest W Contrast Result  Date: 03/09/2024 EXAM: CT CHEST WITH CONTRAST 03/08/2024 10:35:44 AM TECHNIQUE: CT of the chest was performed with the administration of intravenous contrast. Multiplanar reformatted images are provided for review. Automated exposure control, iterative reconstruction, and/or weight based adjustment of the mA/kV was utilized to reduce the radiation dose to as low as reasonably achievable. COMPARISON: 12/09/2023 CLINICAL HISTORY: Abnormal xray - lung nodule, >= 1 cm. 75ml omni300; "DIAGNOSIS: 1) synchronous adenocarcinoma of the right upper lobe as well as 2 nodules involving the lingula and left upper lobe diagnosed in April 2024.  Upon further testing and immunohistochemical stains, the final cytology was consistent with metastatic prostate adenocarcinoma rather than non-small cell lung cancer. 2) prostate adenocarcinoma with Gleason score of 6 (3+3) and Gleason score of 7 (4+3) diagnosed in November 2024 and managed by Dr. Dulcy Gibney. PRIOR THERAPY: 1) SBRT to the 3 pulmonary nodules under the care of Dr. Eloise Hake; 2) status post curative radiotherapy to the prostate gland under the care of of Dr. Lorri Rota. CURRENT THERAPY: Observation" FINDINGS: MEDIASTINUM: Heart and pericardium are unremarkable. The central airways are clear. Mild coronary atherosclerosis of the LAD (left anterior descending artery). LYMPH NODES: No mediastinal, hilar or axillary lymphadenopathy. LUNGS AND PLEURA: No focal consolidation or pulmonary edema. No pleural effusion or pneumothorax. Radiation changes in the anterior left upper lobe (image 43), lingula (image 85), and medial right upper lobe (image 56). 11 x 6 mm bilobed medial left lower lobe nodule (image 105), unchanged. 4 mm right lower lobe nodule (image 75), unchanged. SOFT TISSUES/BONES: No acute abnormality of the bones or soft tissues. UPPER ABDOMEN: Limited images of the upper abdomen demonstrates no acute abnormality. VASCULATURE: Mild thoracic aortic atherosclerosis. IMPRESSION: 1. Stable 11 x 6 mm bilobed medial left lower lobe nodule. Continued attention on follow-up is suggested. 2. Scattered radiation changes, particularly in the left upper lobe and lingula. Electronically signed by: Zadie Herter MD 03/09/2024 10:03 PM EDT RP Workstation: [id number]    ASSESSMENT AND PLAN: This is a very pleasant 73 years old white male with metastatic prostate adenocarcinoma with metastasis to the lung presented with right upper lobe and left upper lobe pulmonary nodules initially thought to represent synchronous lung primary but upon immunohistochemical stains it was consistent with prostate adenocarcinoma  diagnosed in April 2024.  He is status post SBRT to the 3 pulmonary nodules under the care of Dr. Eloise Hake The patient was recently diagnosed with prostate adenocarcinoma with Gleason score in the range of 6 (3+3) and 7 (4+3) November 2024 and he completed a course of curative radiotherapy to the prostate at the care of Dr. Lorri Rota recently. He is currently on observation and feeling fine except for fatigue. He had repeat CT scan of the chest performed recently.  I personally and independently reviewed the scan and discussed the results with the patient today.  His scan showed no concerning findings for disease progression. Assessment and Plan    Metastatic prostate adenocarcinoma Metastatic prostate adenocarcinoma with well-managed nodules in the right upper lobe, lingula, and left upper lobe, consistent with prostate cancer metastasis. PSA levels are elevated. Hormonal therapy is recommended due to high PSA levels, but he has experienced significant side effects from previous Lupron therapy, including exhaustion, hot flashes, and mobility issues. Alternative dosing schedules and adjunctive medications like Xtandi are discussed to mitigate side effects. Radiation therapy to the lung nodule is an option if hormonal therapy is deferred, but the nodule is small and well-managed, so continued observation is also a viable option. - Discuss  hormonal therapy options, including Lupron with adjunctive medication like Xtandi, to reduce side effects. - Consider radiation therapy to the lung nodule if hormonal therapy is deferred and nodule growth is observed.  Curative radiotherapy to prostate gland Curative radiotherapy to the prostate gland with no current issues related to this treatment.  Follow-up Follow-up plan to monitor disease status and treatment effects. - Schedule follow-up appointment in four months. - Order repeat CT scan in four months. - Order blood work in four months.   The patient was  advised to call immediately if he has any concerning symptoms in the interval. The patient voices understanding of current disease status and treatment options and is in agreement with the current care plan.  All questions were answered. The patient knows to call the clinic with any problems, questions or concerns. We can certainly see the patient much sooner if necessary.  The total time spent in the appointment was 30 minutes.  Disclaimer: This note was dictated with voice recognition software. Similar sounding words can inadvertently be transcribed and may not be corrected upon review.

## 2024-03-18 DIAGNOSIS — M79674 Pain in right toe(s): Secondary | ICD-10-CM | POA: Diagnosis not present

## 2024-03-28 DIAGNOSIS — Z8546 Personal history of malignant neoplasm of prostate: Secondary | ICD-10-CM | POA: Diagnosis not present

## 2024-04-04 DIAGNOSIS — M79674 Pain in right toe(s): Secondary | ICD-10-CM | POA: Diagnosis not present

## 2024-04-12 ENCOUNTER — Telehealth: Payer: Self-pay | Admitting: *Deleted

## 2024-04-12 NOTE — Telephone Encounter (Signed)
 Copied from CRM [id number]. Topic: Clinical - Medical Advice >> Apr 12, 2024 12:12 PM Nathanel DEL wrote: Reason for CRM:   paula w/ [facility] states she faxed a clearance form for an urgent surgery on 7/02, scheduled this Thurs, July 10. Can you send asap?questions? Call [place] at [phone] She will refax now as well.   Pt will need ov to get cleared  I called Vina and there was no answer- called and left detailed msg that he needs appt  I called the pt and there was no answer- LMTCB

## 2024-04-13 NOTE — Telephone Encounter (Signed)
 Spoke with Vina  She states that pt does not plan to f/u with us  and they no longer require clearance  Nothing further needed

## 2024-04-14 DIAGNOSIS — M85671 Other cyst of bone, right ankle and foot: Secondary | ICD-10-CM | POA: Diagnosis not present

## 2024-04-14 DIAGNOSIS — G8918 Other acute postprocedural pain: Secondary | ICD-10-CM | POA: Diagnosis not present

## 2024-04-14 DIAGNOSIS — M2021 Hallux rigidus, right foot: Secondary | ICD-10-CM | POA: Diagnosis not present

## 2024-04-14 NOTE — Telephone Encounter (Signed)
 Copied from CRM [id number]. Topic: General - Other >> Apr 12, 2024  4:58 PM Celestine F wrote: Reason for CRM: Pt stated Sonny was attempting to call him regarding scheduling an appt. Pt stated he doesn't want an appt, and that he doesn't want to be contacted for an appt.  duplicate

## 2024-04-26 DIAGNOSIS — M2021 Hallux rigidus, right foot: Secondary | ICD-10-CM | POA: Diagnosis not present

## 2024-05-10 DIAGNOSIS — Z0000 Encounter for general adult medical examination without abnormal findings: Secondary | ICD-10-CM | POA: Diagnosis not present

## 2024-05-10 DIAGNOSIS — Z23 Encounter for immunization: Secondary | ICD-10-CM | POA: Diagnosis not present

## 2024-05-11 ENCOUNTER — Emergency Department (HOSPITAL_COMMUNITY)
Admission: EM | Admit: 2024-05-11 | Discharge: 2024-05-11 | Disposition: A | Discharge status: Home / Self Care | Attending: Emergency Medicine | Admitting: Emergency Medicine

## 2024-05-11 ENCOUNTER — Other Ambulatory Visit: Payer: Self-pay

## 2024-05-11 ENCOUNTER — Encounter (HOSPITAL_COMMUNITY): Payer: Self-pay | Admitting: Emergency Medicine

## 2024-05-11 ENCOUNTER — Emergency Department (HOSPITAL_COMMUNITY)

## 2024-05-11 ENCOUNTER — Ambulatory Visit
Admission: EM | Admit: 2024-05-11 | Discharge: 2024-05-11 | Disposition: A | Discharge status: Home / Self Care | Source: Home / Self Care

## 2024-05-11 ENCOUNTER — Encounter: Payer: Self-pay | Admitting: Emergency Medicine

## 2024-05-11 DIAGNOSIS — M79605 Pain in left leg: Secondary | ICD-10-CM | POA: Diagnosis not present

## 2024-05-11 DIAGNOSIS — R42 Dizziness and giddiness: Secondary | ICD-10-CM | POA: Diagnosis not present

## 2024-05-11 DIAGNOSIS — Z7901 Long term (current) use of anticoagulants: Secondary | ICD-10-CM | POA: Diagnosis not present

## 2024-05-11 DIAGNOSIS — R001 Bradycardia, unspecified: Secondary | ICD-10-CM | POA: Diagnosis not present

## 2024-05-11 DIAGNOSIS — W19XXXA Unspecified fall, initial encounter: Secondary | ICD-10-CM

## 2024-05-11 DIAGNOSIS — Y9301 Activity, walking, marching and hiking: Secondary | ICD-10-CM | POA: Insufficient documentation

## 2024-05-11 DIAGNOSIS — W010XXA Fall on same level from slipping, tripping and stumbling without subsequent striking against object, initial encounter: Secondary | ICD-10-CM | POA: Insufficient documentation

## 2024-05-11 DIAGNOSIS — M79606 Pain in leg, unspecified: Secondary | ICD-10-CM | POA: Diagnosis not present

## 2024-05-11 LAB — CBC
HCT: 44.9 % (ref 39.0–52.0)
Hemoglobin: 14.5 g/dL (ref 13.0–17.0)
MCH: 33 pg (ref 26.0–34.0)
MCHC: 32.3 g/dL (ref 30.0–36.0)
MCV: 102 fL — ABNORMAL HIGH (ref 80.0–100.0)
Platelets: 141 K/uL — ABNORMAL LOW (ref 150–400)
RBC: 4.4 MIL/uL (ref 4.22–5.81)
RDW: 12.6 % (ref 11.5–15.5)
WBC: 5.5 K/uL (ref 4.0–10.5)
nRBC: 0 % (ref 0.0–0.2)

## 2024-05-11 LAB — BASIC METABOLIC PANEL WITH GFR
Anion gap: 8 (ref 5–15)
BUN: 18 mg/dL (ref 8–23)
CO2: 27 mmol/L (ref 22–32)
Calcium: 9.5 mg/dL (ref 8.9–10.3)
Chloride: 103 mmol/L (ref 98–111)
Creatinine, Ser: 1.08 mg/dL (ref 0.61–1.24)
GFR, Estimated: >60 mL/min (ref >60)
Glucose, Bld: 94 mg/dL (ref 70–99)
Potassium: 4.5 mmol/L (ref 3.5–5.1)
Sodium: 138 mmol/L (ref 135–145)

## 2024-05-11 LAB — TROPONIN I (HIGH SENSITIVITY)
Troponin I (High Sensitivity): 4 ng/L (ref <18)
Troponin I (High Sensitivity): 5 ng/L (ref <18)

## 2024-05-11 LAB — GLUCOSE, POCT (MANUAL RESULT ENTRY): POC Glucose: 86 mg/dL (ref 70–99)

## 2024-05-11 LAB — PROTIME-INR
INR: 1.3 — ABNORMAL HIGH (ref 0.8–1.2)
Prothrombin Time: 16.9 s — ABNORMAL HIGH (ref 11.4–15.2)

## 2024-05-11 MED ORDER — SODIUM CHLORIDE 0.9 % IV BOLUS
1000.0000 mL | Freq: Once | INTRAVENOUS | Status: AC
  Administered 2024-05-11: 1000 mL via INTRAVENOUS

## 2024-05-11 NOTE — ED Triage Notes (Addendum)
 Patient reports that tripped and fell up the stairs at the [facility] on today around 9 am. Patient reports feeling groggy  and  sluggishness. Patient complains of left knee and shin pain  after the fall.

## 2024-05-11 NOTE — ED Provider Notes (Signed)
 Richard Delacruz   CSN: [id number] Arrival date & time: 05/11/24  1209     Patient presents with: Richard Delacruz is a 73 y.o. male patient with past medical history of DVT on Xarelto  who presents to the emergency department after a mechanical trip and fall at the [facility] at approximately 9 AM.  Patient states he was walking up some stairs when he thinks that he is slipped on something wet causing him to fall forward onto the stairs.  He did not hit his head but is complaining of left shin pain.  Patient was formally evaluated at urgent care but was stating that he was feeling a little lightheaded at that time.  They did an EKG and found that he was having some PACs with bradycardia.  Patient did not have any chest pain or shortness of breath.  He was sent here immediately for further evaluation.  Currently patient is still feeling mildly lightheaded.  No chest pain or shortness of breath.  Patient states he has been eating and drinking normally.    Fall       Prior to Admission medications   Medication Sig Start Date End Date Taking? Authorizing Provider  acetaminophen  (TYLENOL ) 500 MG tablet Take 1,000 mg by mouth every 6 (six) hours as needed for moderate pain.    [provider]  atorvastatin  (LIPITOR) 40 MG tablet Take 40 mg by mouth daily. 02/13/16   [provider]  buPROPion  (WELLBUTRIN  SR) 150 MG 12 hr tablet Take 150 mg by mouth 3 (three) times daily. 04/25/14   [provider]  Calcium  Citrate-Vitamin D (CALCIUM  + D PO) Take 2 tablets by mouth daily.    [provider]  clonazePAM  (KLONOPIN ) 1 MG tablet Take 1 mg by mouth 2 (two) times daily.  05/26/14   [provider]  COMIRNATY syringe  07/07/23   [provider]  diphenhydramine -acetaminophen  (TYLENOL  PM) 25-500 MG TABS tablet Take 2 tablets by mouth at bedtime.    [provider]  FLUZONE HIGH-DOSE 0.5 ML  injection  07/07/23   [provider]  lamoTRIgine  (LAMICTAL ) 100 MG tablet Take 100 mg by mouth 2 (two) times daily. 03/06/14   [provider]  metoprolol  succinate (TOPROL -XL) 100 MG 24 hr tablet Take 100 mg by mouth daily. Take with or immediately following a meal.    [provider]  Multiple Vitamin (MULTIVITAMIN WITH MINERALS) TABS tablet Take 1 tablet by mouth daily.    [provider]  rivaroxaban  (XARELTO ) 10 MG TABS tablet Take 1 tablet (10 mg total) by mouth daily. 01/28/23   Richard Lamar RAMAN, MD    Allergies: Atacand [candesartan], Amlodipine, Mirtazapine, Monopril [fosinopril], Naproxen, Trazodone, and Niaspan [niacin er (antihyperlipidemic)]    Review of Systems  All other systems reviewed and are negative.   Updated Vital Signs BP (!) 145/78 (BP Location: Right Wrist)   Pulse (!) 42   Temp 97.9 F (36.6 C) (Oral)   Resp 11   Ht 5' 10 (1.778 m)   Wt 78.5 kg   SpO2 100%   BMI 24.82 kg/m   Physical Exam Vitals and nursing Delacruz reviewed.  Constitutional:      General: He is not in acute distress.    Appearance: Normal appearance.  HENT:     Head: Normocephalic and atraumatic.  Eyes:     General:        Right eye: No  discharge.        Left eye: No discharge.  Cardiovascular:     Comments: Regular rate and rhythm.  S1/S2 are distinct without any evidence of murmur, rubs, or gallops.  Radial pulses are 2+ bilaterally.  Dorsalis pedis pulses are 2+ bilaterally.  No evidence of pedal edema. Pulmonary:     Comments: Clear to auscultation bilaterally.  Normal effort.  No respiratory distress.  No evidence of wheezes, rales, or rhonchi heard throughout. Abdominal:     General: Abdomen is flat. Bowel sounds are normal. There is no distension.     Tenderness: There is no abdominal tenderness. There is no guarding or rebound.  Musculoskeletal:        General: Normal range of motion.     Cervical back: Neck supple.     Comments: There are  2 isolated areas of ecchymosis to the left shin.  Skin:    General: Skin is warm and dry.     Findings: No rash.  Neurological:     General: No focal deficit present.     Mental Status: He is alert.  Psychiatric:        Mood and Affect: Mood normal.        Behavior: Behavior normal.     (all labs ordered are listed, but only abnormal results are displayed) Labs Reviewed  CBC - Abnormal; Notable for the following components:      Result Value   MCV 102.0 (*)    Platelets 141 (*)    All other components within normal limits  PROTIME-INR - Abnormal; Notable for the following components:   Prothrombin Time 16.9 (*)    INR 1.3 (*)    All other components within normal limits  BASIC METABOLIC PANEL WITH GFR  TROPONIN I (HIGH SENSITIVITY)  TROPONIN I (HIGH SENSITIVITY)    EKG: EKG Interpretation Date/Time:  Wednesday May 11 2024 15:39:13 EDT Ventricular Rate:  43 PR Interval:  201 QRS Duration:  110 QT Interval:  486 QTC Calculation: 411 R Axis:   -51  Text Interpretation: Sinus bradycardia Left anterior fascicular block Abnormal R-wave progression, late transition Confirmed by Richard Savant ([id number]) on 05/11/2024 5:13:45 PM  Radiology: ARCOLA Tibia/Fibula Left Result Date: 05/11/2024 CLINICAL DATA:  Fall, leg pain EXAM: LEFT TIBIA AND FIBULA - 2 VIEW COMPARISON:  None Available. FINDINGS: No acute bony abnormality. Specifically, no fracture, subluxation, or dislocation. Left knee joint space narrowing and spurring most pronounced in the patellofemoral compartment. No joint effusion. Soft tissues are intact. IMPRESSION: No acute bony abnormality. Electronically Signed   By: Franky Crease M.D.   On: 05/11/2024 14:13   DG Chest 2 View Result Date: 05/11/2024 CLINICAL DATA:  Fall while going up stairs.  Leg pain. EXAM: CHEST - 2 VIEW COMPARISON:  02/06/2023 FINDINGS: Heart and mediastinal contours are within normal limits. No focal opacities or effusions. No acute bony abnormality.  IMPRESSION: No active cardiopulmonary disease. Electronically Signed   By: Franky Crease M.D.   On: 05/11/2024 14:12     Procedures   Medications Ordered in the ED  sodium chloride  0.9 % bolus 1,000 mL (0 mLs Intravenous Stopped 05/11/24 1744)    Clinical Course as of 05/11/24 1811  Wed May 11, 2024  1720 On reevaluation, patient is eating some crackers at the bedside getting some fluids. He states he is feeling better. Will plan to cut his metoprolol  from 100 to 50 mg given follow-up with cardiology. [CF]    Clinical Course User  Index [CF] Theotis Cameron HERO, PA-C    Medical Decision Making CHRYSTIAN CUPPLES is a 73 y.o. male patient presents to the emergency department today for further evaluation of mechanical trip and fall and subsequent lightheadedness.  Patient did not have any prodromal symptoms prior to the fall.  I think that the lightheadedness is isolated coincidence.  Patient is not having any active cardiac symptoms at this time.  Patient is bradycardic on exam.  Will come up to cardiac monitoring get a formal EKG.  Labs were ordered in triage.  Labs are all reassuring.  No significant abnormalities today.  Doubt ACS.  Low suspicion for any consistent arrhythmia today.  Patient did have some PACs and on that we could capture on EKG today.  Will have him cut back on his metoprolol  from 100 mg to 50 mg and give him follow-up with cardiology.  Patient is overall feeling better.  Strict turn precautions were discussed.  He is safer discharge.  Amount and/or Complexity of Data Reviewed Labs: ordered. Radiology: ordered.    Final diagnoses:  Fall, initial encounter  Lightheadedness  Bradycardia    ED Discharge Orders          Ordered    Ambulatory referral to Cardiology        05/11/24 1809               Theotis Cameron HERO, DEVONNA 05/11/24 2355    Richard Jayson LABOR, DO 05/16/24 1554

## 2024-05-11 NOTE — ED Triage Notes (Signed)
 Pt sent here from UC after falling at the [facility] this am is on blood thinners but did not hit his head , c/o left knee and shin pain , UC sent him over for possible irregular hearty beat

## 2024-05-11 NOTE — ED Triage Notes (Signed)
 Pt sent from UC. States he fell going up the stairs today but unsure how he fell. Pt endorses left leg pain and groggy. Denies hitting head, pt on xarelto  for DVT.

## 2024-05-11 NOTE — ED Provider Notes (Signed)
 Presents for evaluation of mild dizziness, sluggishness and feeling groggy starting today.  Came into urgent care for evaluation of the left knee and shin pain after falling while walking upstairs.  On initial assessment heart rate fluctuating from 40s to 60s, manually checked and was 45, EKG showing sinus bradycardia with PACs which is a new rhythm for patient in comparison to prior EKGs available in chart therefore patient is being sent to the nearest emergency department for further evaluation and management, remaining vital signs are stable and therefore he will escort self   Teresa Shelba SAUNDERS, NP 05/11/24 1131

## 2024-05-11 NOTE — Discharge Instructions (Signed)
 I have given you follow-up with cardiology.  They should call you to schedule an appointment.  In the meantime, please reduce your metoprolol  from 100 mg to 50 mg.  Continue drinking plenty fluids and get plenty rest.

## 2024-05-11 NOTE — ED Notes (Signed)
 Patient is being discharged from the Urgent Care and sent to the Emergency Department via POV . Per A. White, NP, patient is in need of higher level of care due to irregular heart rate. Patient is aware and verbalizes understanding of plan of care.  Vitals:   05/11/24 1123 05/11/24 1126  BP: 124/64   Pulse: (!) 53 60  Resp: 16   Temp: 99.5 F (37.5 C)   SpO2: 100%

## 2024-06-08 DIAGNOSIS — M79674 Pain in right toe(s): Secondary | ICD-10-CM | POA: Diagnosis not present

## 2024-06-08 DIAGNOSIS — M2021 Hallux rigidus, right foot: Secondary | ICD-10-CM | POA: Diagnosis not present

## 2024-06-09 DIAGNOSIS — Z86018 Personal history of other benign neoplasm: Secondary | ICD-10-CM | POA: Diagnosis not present

## 2024-06-09 DIAGNOSIS — Z7901 Long term (current) use of anticoagulants: Secondary | ICD-10-CM | POA: Diagnosis not present

## 2024-06-09 DIAGNOSIS — I10 Essential (primary) hypertension: Secondary | ICD-10-CM | POA: Diagnosis not present

## 2024-06-09 DIAGNOSIS — Z86718 Personal history of other venous thrombosis and embolism: Secondary | ICD-10-CM | POA: Diagnosis not present

## 2024-06-21 DIAGNOSIS — C61 Malignant neoplasm of prostate: Secondary | ICD-10-CM | POA: Diagnosis not present

## 2024-06-23 DIAGNOSIS — D126 Benign neoplasm of colon, unspecified: Secondary | ICD-10-CM | POA: Diagnosis not present

## 2024-06-23 DIAGNOSIS — D122 Benign neoplasm of ascending colon: Secondary | ICD-10-CM | POA: Diagnosis not present

## 2024-06-23 DIAGNOSIS — D123 Benign neoplasm of transverse colon: Secondary | ICD-10-CM | POA: Diagnosis not present

## 2024-06-23 DIAGNOSIS — Z860101 Personal history of adenomatous and serrated colon polyps: Secondary | ICD-10-CM | POA: Diagnosis not present

## 2024-06-23 DIAGNOSIS — D125 Benign neoplasm of sigmoid colon: Secondary | ICD-10-CM | POA: Diagnosis not present

## 2024-06-23 DIAGNOSIS — Z09 Encounter for follow-up examination after completed treatment for conditions other than malignant neoplasm: Secondary | ICD-10-CM | POA: Diagnosis not present

## 2024-06-28 DIAGNOSIS — R3912 Poor urinary stream: Secondary | ICD-10-CM | POA: Diagnosis not present

## 2024-06-28 DIAGNOSIS — R9721 Rising PSA following treatment for malignant neoplasm of prostate: Secondary | ICD-10-CM | POA: Diagnosis not present

## 2024-07-04 ENCOUNTER — Other Ambulatory Visit: Payer: Self-pay | Admitting: Internal Medicine

## 2024-07-04 ENCOUNTER — Inpatient Hospital Stay: Attending: Internal Medicine

## 2024-07-04 ENCOUNTER — Ambulatory Visit (HOSPITAL_COMMUNITY)
Admission: RE | Admit: 2024-07-04 | Discharge: 2024-07-04 | Disposition: A | Discharge status: Home / Self Care | Source: Ambulatory Visit | Attending: Internal Medicine | Admitting: Internal Medicine

## 2024-07-04 DIAGNOSIS — C61 Malignant neoplasm of prostate: Secondary | ICD-10-CM | POA: Diagnosis not present

## 2024-07-04 DIAGNOSIS — N281 Cyst of kidney, acquired: Secondary | ICD-10-CM | POA: Diagnosis not present

## 2024-07-04 DIAGNOSIS — K5730 Diverticulosis of large intestine without perforation or abscess without bleeding: Secondary | ICD-10-CM | POA: Diagnosis not present

## 2024-07-04 DIAGNOSIS — R918 Other nonspecific abnormal finding of lung field: Secondary | ICD-10-CM | POA: Diagnosis not present

## 2024-07-04 LAB — CMP (CANCER CENTER ONLY)
ALT: 22 U/L (ref 0–44)
AST: 23 U/L (ref 15–41)
Albumin: 4.5 g/dL (ref 3.5–5.0)
Alkaline Phosphatase: 85 U/L (ref 38–126)
Anion gap: 5 (ref 5–15)
BUN: 21 mg/dL (ref 8–23)
CO2: 34 mmol/L — ABNORMAL HIGH (ref 22–32)
Calcium: 9.7 mg/dL (ref 8.9–10.3)
Chloride: 102 mmol/L (ref 98–111)
Creatinine: 1.24 mg/dL (ref 0.61–1.24)
GFR, Estimated: >60 mL/min (ref >60)
Glucose, Bld: 100 mg/dL — ABNORMAL HIGH (ref 70–99)
Potassium: 4.5 mmol/L (ref 3.5–5.1)
Sodium: 141 mmol/L (ref 135–145)
Total Bilirubin: 0.4 mg/dL (ref 0.0–1.2)
Total Protein: 7.2 g/dL (ref 6.5–8.1)

## 2024-07-04 LAB — CBC WITH DIFFERENTIAL (CANCER CENTER ONLY)
Abs Immature Granulocytes: 0.02 K/uL (ref 0.00–0.07)
Basophils Absolute: 0 K/uL (ref 0.0–0.1)
Basophils Relative: 1 %
Eosinophils Absolute: 0.1 K/uL (ref 0.0–0.5)
Eosinophils Relative: 2 %
HCT: 46.5 % (ref 39.0–52.0)
Hemoglobin: 15.3 g/dL (ref 13.0–17.0)
Immature Granulocytes: 0 %
Lymphocytes Relative: 16 %
Lymphs Abs: 0.9 K/uL (ref 0.7–4.0)
MCH: 32.6 pg (ref 26.0–34.0)
MCHC: 32.9 g/dL (ref 30.0–36.0)
MCV: 99.1 fL (ref 80.0–100.0)
Monocytes Absolute: 0.5 K/uL (ref 0.1–1.0)
Monocytes Relative: 9 %
Neutro Abs: 4.3 K/uL (ref 1.7–7.7)
Neutrophils Relative %: 72 %
Platelet Count: 148 K/uL — ABNORMAL LOW (ref 150–400)
RBC: 4.69 MIL/uL (ref 4.22–5.81)
RDW: 12.5 % (ref 11.5–15.5)
WBC Count: 5.9 K/uL (ref 4.0–10.5)
nRBC: 0 % (ref 0.0–0.2)

## 2024-07-04 MED ORDER — SODIUM CHLORIDE (PF) 0.9 % IJ SOLN
INTRAMUSCULAR | Status: AC
  Filled 2024-07-04: qty 250

## 2024-07-04 MED ORDER — IOHEXOL 300 MG/ML  SOLN
100.0000 mL | Freq: Once | INTRAMUSCULAR | Status: AC | PRN
  Administered 2024-07-04: 100 mL via INTRAVENOUS

## 2024-07-05 LAB — PSA, TOTAL AND FREE
PSA, Free Pct: 8.1 %
PSA, Free: 0.71 ng/mL
Prostate Specific Ag, Serum: 8.8 ng/mL — ABNORMAL HIGH (ref 0.0–4.0)

## 2024-07-12 ENCOUNTER — Inpatient Hospital Stay: Attending: Internal Medicine | Admitting: Internal Medicine

## 2024-07-12 VITALS — BP 132/86 | HR 52 | Temp 97.7°F | Resp 17 | Ht 70.0 in | Wt 175.5 lb

## 2024-07-12 DIAGNOSIS — C3411 Malignant neoplasm of upper lobe, right bronchus or lung: Secondary | ICD-10-CM | POA: Diagnosis not present

## 2024-07-12 DIAGNOSIS — C61 Malignant neoplasm of prostate: Secondary | ICD-10-CM | POA: Insufficient documentation

## 2024-07-12 DIAGNOSIS — Z86718 Personal history of other venous thrombosis and embolism: Secondary | ICD-10-CM | POA: Diagnosis not present

## 2024-07-12 DIAGNOSIS — Z6825 Body mass index (BMI) 25.0-25.9, adult: Secondary | ICD-10-CM | POA: Diagnosis not present

## 2024-07-12 DIAGNOSIS — I10 Essential (primary) hypertension: Secondary | ICD-10-CM | POA: Diagnosis not present

## 2024-07-12 DIAGNOSIS — F411 Generalized anxiety disorder: Secondary | ICD-10-CM | POA: Diagnosis not present

## 2024-07-12 DIAGNOSIS — E7800 Pure hypercholesterolemia, unspecified: Secondary | ICD-10-CM | POA: Diagnosis not present

## 2024-07-12 DIAGNOSIS — F339 Major depressive disorder, recurrent, unspecified: Secondary | ICD-10-CM | POA: Diagnosis not present

## 2024-07-12 NOTE — Progress Notes (Signed)
 [facility] [phone]   [phone]  OFFICE PROGRESS NOTE  Cleotilde Planas, MD [address] [place] [address]  DIAGNOSIS:   1) synchronous adenocarcinoma of the right upper lobe as well as 2 nodules involving the lingula and left upper lobe diagnosed in April 2024.  Upon further testing and immunohistochemical stains, the final cytology was consistent with metastatic prostate adenocarcinoma rather than non-small cell lung cancer. 2) prostate adenocarcinoma with Gleason score of 6 (3+3) and Gleason score of 7 (4+3) diagnosed in November 2024 and managed by Dr. Cam.  PRIOR THERAPY:  1) SBRT to the 3 pulmonary nodules under the care of Dr. Shannon 2) status post curative radiotherapy to the prostate gland under the care of of Dr. Patrcia.  CURRENT THERAPY: Observation  INTERVAL HISTORY: Richard Delacruz 73 y.o. male returns to the clinic today for follow-up visit.Discussed the use of AI scribe software for clinical note transcription with the patient, who gave verbal consent to proceed.  History of Present Illness Richard Delacruz is a 73 year old male with synchronous adenocarcinoma of the right upper lobe and prostate cancer who presents for restaging of his disease.  He has a history of synchronous adenocarcinoma of the right upper lobe and two nodules involving the lingula and left upper lobe, diagnosed in April 2024. Further staining confirmed metastatic adenocarcinoma. He underwent stereotactic body radiation therapy (SBRT) to the three lung nodules and is currently under observation.  He also has a history of prostate cancer, diagnosed in November 2024, with Gleason scores of 6 (3+3) and 7 (4+). He is status post curative radiotherapy and has been experiencing fluctuating PSA levels since 2011, with the most recent level around 8. He has undergone various treatments including proton beam therapy and androgen deprivation therapy. His  PSA levels have never dropped below 4 and have been fluctuating for years.  In August, he experienced a fall at the [facility], where he fell up the stairs. He has been to multiple doctors and had a PET scan in December and a CT scan in June.    MEDICAL HISTORY: Past Medical History:  Diagnosis Date   Anxiety    Arthritis    Cancer (HCC)    Prostate and melanoma   Depression    DVT (deep venous thrombosis) (HCC)    right lower leg   GERD (gastroesophageal reflux disease)    History of radiation therapy    Right lung- 03/30/23-04/10/23- Dr. Lynwood Shannon   Hypertension     ALLERGIES:  is allergic to atacand [candesartan], amlodipine, mirtazapine, monopril [fosinopril], naproxen, trazodone, and niaspan [niacin er (antihyperlipidemic)].  MEDICATIONS:  Current Outpatient Medications  Medication Sig Dispense Refill   acetaminophen  (TYLENOL ) 500 MG tablet Take 1,000 mg by mouth every 6 (six) hours as needed for moderate pain.     atorvastatin  (LIPITOR) 40 MG tablet Take 40 mg by mouth daily.     buPROPion  (WELLBUTRIN  SR) 150 MG 12 hr tablet Take 150 mg by mouth 3 (three) times daily.     Calcium  Citrate-Vitamin D (CALCIUM  + D PO) Take 2 tablets by mouth daily.     clonazePAM  (KLONOPIN ) 1 MG tablet Take 1 mg by mouth 2 (two) times daily.      COMIRNATY syringe      diphenhydramine -acetaminophen  (TYLENOL  PM) 25-500 MG TABS tablet Take 2 tablets by mouth at bedtime.     FLUZONE HIGH-DOSE 0.5 ML injection  lamoTRIgine  (LAMICTAL ) 100 MG tablet Take 100 mg by mouth 2 (two) times daily.     metoprolol  succinate (TOPROL -XL) 100 MG 24 hr tablet Take 100 mg by mouth daily. Take with or immediately following a meal.     Multiple Vitamin (MULTIVITAMIN WITH MINERALS) TABS tablet Take 1 tablet by mouth daily.     rivaroxaban  (XARELTO ) 10 MG TABS tablet Take 1 tablet (10 mg total) by mouth daily. 30 tablet 0   No current facility-administered medications for this visit.    SURGICAL HISTORY:  Past  Surgical History:  Procedure Laterality Date   ADENOIDECTOMY     BRONCHIAL BIOPSY  01/26/2023   Procedure: BRONCHIAL BIOPSIES;  Surgeon: Shelah Lamar RAMAN, MD;  Location: [facility] ENDOSCOPY;  Service: Pulmonary;;   BRONCHIAL BRUSHINGS  01/26/2023   Procedure: BRONCHIAL BRUSHINGS;  Surgeon: Shelah Lamar RAMAN, MD;  Location: [facility] ENDOSCOPY;  Service: Pulmonary;;   BRONCHIAL NEEDLE ASPIRATION BIOPSY  01/26/2023   Procedure: BRONCHIAL NEEDLE ASPIRATION BIOPSIES;  Surgeon: Shelah Lamar RAMAN, MD;  Location: [facility] ENDOSCOPY;  Service: Pulmonary;;   COLONOSCOPY     CYSTOSCOPY/URETEROSCOPY/HOLMIUM LASER/STENT PLACEMENT Left 03/23/2023   Procedure: CYSTOSCOPY/URETEROSCOPY/HOLMIUM LASER/STENT PLACEMENT;  Surgeon: Watt Rush, MD;  Location: [facility] ORS;  Service: Urology;  Laterality: Left;   TONSILLECTOMY     VIDEO BRONCHOSCOPY WITH RADIAL ENDOBRONCHIAL ULTRASOUND  01/26/2023   Procedure: VIDEO BRONCHOSCOPY WITH RADIAL ENDOBRONCHIAL ULTRASOUND;  Surgeon: Shelah Lamar RAMAN, MD;  Location: [facility] ENDOSCOPY;  Service: Pulmonary;;    REVIEW OF SYSTEMS:  Constitutional: positive for fatigue Eyes: negative Ears, nose, mouth, throat, and face: negative Respiratory: negative Cardiovascular: negative Gastrointestinal: negative Genitourinary:negative Integument/breast: negative Hematologic/lymphatic: negative Musculoskeletal:negative Neurological: negative Behavioral/Psych: negative Endocrine: negative Allergic/Immunologic: negative   PHYSICAL EXAMINATION: General appearance: alert, cooperative, fatigued, and no distress Head: Normocephalic, without obvious abnormality, atraumatic Neck: no adenopathy, no carotid bruit, supple, symmetrical, trachea midline, and thyroid  not enlarged, symmetric, no tenderness/mass/nodules Lymph nodes: Cervical, supraclavicular, and axillary nodes normal. Resp: clear to auscultation bilaterally Back: symmetric, no curvature. ROM normal. No CVA tenderness. Cardio: regular rate and rhythm, S1, S2  normal, no murmur, click, rub or gallop GI: soft, non-tender; bowel sounds normal; no masses,  no organomegaly Extremities: extremities normal, atraumatic, no cyanosis or edema Neurologic: Alert and oriented X 3, normal strength and tone. Normal symmetric reflexes. Normal coordination and gait  ECOG PERFORMANCE STATUS: 1 - Symptomatic but completely ambulatory  Blood pressure 132/86, pulse (!) 52, temperature 97.7 F (36.5 C), resp. rate 17, height 5' 10 (1.778 m), weight 175 lb 8 oz (79.6 kg), SpO2 100%.  LABORATORY DATA: Lab Results  Component Value Date   WBC 5.9 07/04/2024   HGB 15.3 07/04/2024   HCT 46.5 07/04/2024   MCV 99.1 07/04/2024   PLT 148 (L) 07/04/2024      Chemistry      Component Value Date/Time   NA 141 07/04/2024 0939   K 4.5 07/04/2024 0939   CL 102 07/04/2024 0939   CO2 34 (H) 07/04/2024 0939   BUN 21 07/04/2024 0939   CREATININE 1.24 07/04/2024 0939      Component Value Date/Time   CALCIUM  9.7 07/04/2024 0939   ALKPHOS 85 07/04/2024 0939   AST 23 07/04/2024 0939   ALT 22 07/04/2024 0939   BILITOT 0.4 07/04/2024 0939       RADIOGRAPHIC STUDIES: CT CHEST ABDOMEN PELVIS W CONTRAST Result Date: 07/04/2024 CLINICAL DATA:  Prostate cancer, high-risk, staging. * Tracking Code: BO * EXAM: CT CHEST, ABDOMEN, AND PELVIS WITH CONTRAST  TECHNIQUE: Multidetector CT imaging of the chest, abdomen and pelvis was performed following the standard protocol during bolus administration of intravenous contrast. RADIATION DOSE REDUCTION: This exam was performed according to the departmental dose-optimization program which includes automated exposure control, adjustment of the mA and/or kV according to patient size and/or use of iterative reconstruction technique. CONTRAST:  OMNIPAQUE  IOHEXOL  300 MG/ML  SOLN COMPARISON:  Multiple priors including CT March 08, 2024 and PET-CT September 24, 2023 FINDINGS: CT CHEST FINDINGS Cardiovascular: Aortic atherosclerosis. Normal size  heart. No significant pericardial effusion/thickening. Mediastinum/Nodes: No suspicious thyroid  nodule. No pathologically enlarged mediastinal, hilar or axillary lymph nodes. The esophagus is grossly unremarkable. Lungs/Pleura: Stable 8 mm pulmonary nodule in the right lung apex on image 25/9. Bilobed solid nodule in the medial left lower lobe measures 12 x 6 mm on image 108/9 previously 11 x 6 mm. A few additional scattered tiny pulmonary nodules are stable from prior examination. For reference: -4 mm pulmonary nodule in the left lower lobe on image 99/9, unchanged -2 mm pulmonary nodule in the right middle lobe on image 95/9, unchanged. Similar postradiation change in the anterior left upper lobe. Similar scarring in the anterior right upper lobe on image 65/9 and lingula on image 91/9. Musculoskeletal: No aggressive lytic or blastic lesion of bone. Thoracic spondylosis. Degenerative change of the shoulders. CT ABDOMEN PELVIS FINDINGS Hepatobiliary: No suspicious hepatic lesion. Gallbladder is unremarkable. No biliary ductal dilation. Pancreas: No pancreatic ductal dilation or evidence of acute inflammation. Spleen: No splenomegaly. Adrenals/Urinary Tract: No suspicious adrenal nodule/mass. Stable left renal cysts and renal lesions technically too small to accurately characterize. Nonobstructive 2-3 mm left lower pole renal stone. Mild symmetric wall thickening of a minimally distended urinary bladder. Stomach/Bowel: Stomach is distended with ingested material and gas without focal wall thickening. No pathologic dilation of small or large bowel. Colonic diverticulosis. Vascular/Lymphatic: Normal caliber abdominal aorta. Smooth IVC contours. The portal, splenic and superior mesenteric veins are patent. No pathologically enlarged abdominal or pelvic lymph nodes. Reproductive: Prostate gland is unremarkable in CT appearance. Other: No significant abdominopelvic free fluid. Musculoskeletal: No aggressive lytic or  blastic lesion of bone. IMPRESSION: 1. No convincing evidence of metastatic disease within the chest, abdomen or pelvis. 2. Stable bilateral pulmonary nodules measuring up to 12 mm in the left lower lobe. 3. Mild symmetric wall thickening of a minimally distended urinary bladder, which may reflect cystitis. 4. Nonobstructive 2-3 mm left lower pole renal stone. 5. Colonic diverticulosis without evidence of acute diverticulitis. 6.  Aortic Atherosclerosis (ICD10-I70.0). Electronically Signed   By: Reyes Holder M.D.   On: 07/04/2024 13:34    ASSESSMENT AND PLAN: This is a very pleasant 73 years old white male with metastatic prostate adenocarcinoma with metastasis to the lung presented with right upper lobe and left upper lobe pulmonary nodules initially thought to represent synchronous lung primary but upon immunohistochemical stains it was consistent with prostate adenocarcinoma diagnosed in April 2024.  He is status post SBRT to the 3 pulmonary nodules under the care of Dr. Shannon The patient was recently diagnosed with prostate adenocarcinoma with Gleason score in the range of 6 (3+3) and 7 (4+3) November 2024 and he completed a course of curative radiotherapy to the prostate at the care of Dr. Patrcia recently. He is currently on observation and feeling fine except for fatigue. He had repeat CT scan of the chest, abdomen and pelvis performed recently.  I personally independently reviewed the scans and discussed the result with the patient  today.  His scan showed no concerning findings for disease progression. Assessment and Plan Assessment & Plan Metastatic adenocarcinoma of lung (right upper lobe, lingula, left upper lobe) Synchronous adenocarcinoma of the right upper lobe and two nodules in the lingula and left upper lobe, diagnosed in April 2024. Status post stereotactic body radiation therapy (SBRT) to the three lung nodules. Current imaging studies (CT scan of chest, abdomen, and pelvis) show no  evidence of disease progression or metastasis. - Repeat CT scan of chest, abdomen, and pelvis in six months.  Prostate cancer, status post radiotherapy, with rising PSA under surveillance for biochemical recurrence Prostate cancer with Gleason scores of 6 and 7, diagnosed in November 2024. Status post curative radiotherapy and androgen deprivation therapy. PSA levels have been fluctuating since 2011, with recent levels around 8. No evidence of metastatic disease on recent imaging. Rising PSA may indicate biochemical recurrence or could be due to inflammation post-radiation. Biochemical failure may occur if PSA continues to rise, indicating hormone resistance. - Continue PSA monitoring with follow-up by Dr. Cam in four months. - Schedule follow-up visit in six months for repeat lab and scan. He was advised to call immediately if he has any concerning symptoms in the interval. The patient voices understanding of current disease status and treatment options and is in agreement with the current care plan.  All questions were answered. The patient knows to call the clinic with any problems, questions or concerns. We can certainly see the patient much sooner if necessary.  The total time spent in the appointment was 30 minutes.  Disclaimer: This note was dictated with voice recognition software. Similar sounding words can inadvertently be transcribed and may not be corrected upon review.

## 2024-09-05 DIAGNOSIS — R310 Gross hematuria: Secondary | ICD-10-CM | POA: Diagnosis not present

## 2025-01-10 ENCOUNTER — Inpatient Hospital Stay: Admitting: Internal Medicine

## 2025-01-10 ENCOUNTER — Inpatient Hospital Stay
